# Patient Record
Sex: Female | Born: 1985
Health system: Southern US, Community
[De-identification: ages and names within clinical notes are randomized; demographics above are authoritative.]

## PROBLEM LIST (undated history)

## (undated) DIAGNOSIS — J45909 Unspecified asthma, uncomplicated: Secondary | ICD-10-CM

## (undated) DIAGNOSIS — I10 Essential (primary) hypertension: Secondary | ICD-10-CM

## (undated) DIAGNOSIS — M549 Dorsalgia, unspecified: Secondary | ICD-10-CM

## (undated) DIAGNOSIS — K219 Gastro-esophageal reflux disease without esophagitis: Secondary | ICD-10-CM

## (undated) DIAGNOSIS — F419 Anxiety disorder, unspecified: Secondary | ICD-10-CM

## (undated) DIAGNOSIS — R87629 Unspecified abnormal cytological findings in specimens from vagina: Secondary | ICD-10-CM

## (undated) DIAGNOSIS — D649 Anemia, unspecified: Secondary | ICD-10-CM

## (undated) DIAGNOSIS — E785 Hyperlipidemia, unspecified: Secondary | ICD-10-CM

## (undated) DIAGNOSIS — K259 Gastric ulcer, unspecified as acute or chronic, without hemorrhage or perforation: Secondary | ICD-10-CM

## (undated) DIAGNOSIS — G43909 Migraine, unspecified, not intractable, without status migrainosus: Secondary | ICD-10-CM

## (undated) DIAGNOSIS — K5900 Constipation, unspecified: Secondary | ICD-10-CM

## (undated) HISTORY — DX: Essential (primary) hypertension: I10

## (undated) HISTORY — DX: Gastro-esophageal reflux disease without esophagitis: K21.9

## (undated) HISTORY — DX: Anxiety disorder, unspecified: F41.9

## (undated) HISTORY — DX: Anemia, unspecified: D64.9

## (undated) HISTORY — DX: Constipation, unspecified: K59.00

## (undated) HISTORY — DX: Hyperlipidemia, unspecified: E78.5

## (undated) HISTORY — DX: Unspecified abnormal cytological findings in specimens from vagina: R87.629

## (undated) HISTORY — DX: Gastric ulcer, unspecified as acute or chronic, without hemorrhage or perforation: K25.9

## (undated) HISTORY — DX: Dorsalgia, unspecified: M54.9

---

## 2014-12-20 ENCOUNTER — Emergency Department (HOSPITAL_BASED_OUTPATIENT_CLINIC_OR_DEPARTMENT_OTHER)
Admission: EM | Admit: 2014-12-20 | Discharge: 2014-12-20 | Disposition: A | Discharge status: Home / Self Care | Payer: 59 | Attending: Emergency Medicine | Admitting: Emergency Medicine

## 2014-12-20 ENCOUNTER — Encounter (HOSPITAL_BASED_OUTPATIENT_CLINIC_OR_DEPARTMENT_OTHER): Payer: Self-pay | Admitting: *Deleted

## 2014-12-20 DIAGNOSIS — Z720 Tobacco use: Secondary | ICD-10-CM | POA: Insufficient documentation

## 2014-12-20 DIAGNOSIS — Z79899 Other long term (current) drug therapy: Secondary | ICD-10-CM | POA: Diagnosis not present

## 2014-12-20 DIAGNOSIS — Z8679 Personal history of other diseases of the circulatory system: Secondary | ICD-10-CM | POA: Diagnosis not present

## 2014-12-20 DIAGNOSIS — J45909 Unspecified asthma, uncomplicated: Secondary | ICD-10-CM | POA: Insufficient documentation

## 2014-12-20 DIAGNOSIS — N760 Acute vaginitis: Secondary | ICD-10-CM | POA: Insufficient documentation

## 2014-12-20 DIAGNOSIS — Z3202 Encounter for pregnancy test, result negative: Secondary | ICD-10-CM | POA: Insufficient documentation

## 2014-12-20 DIAGNOSIS — N898 Other specified noninflammatory disorders of vagina: Secondary | ICD-10-CM | POA: Diagnosis present

## 2014-12-20 DIAGNOSIS — B9689 Other specified bacterial agents as the cause of diseases classified elsewhere: Secondary | ICD-10-CM

## 2014-12-20 HISTORY — DX: Unspecified asthma, uncomplicated: J45.909

## 2014-12-20 HISTORY — DX: Migraine, unspecified, not intractable, without status migrainosus: G43.909

## 2014-12-20 LAB — WET PREP, GENITAL
Trich, Wet Prep: NONE SEEN
Yeast Wet Prep HPF POC: NONE SEEN

## 2014-12-20 LAB — URINE MICROSCOPIC-ADD ON

## 2014-12-20 LAB — URINALYSIS, ROUTINE W REFLEX MICROSCOPIC
GLUCOSE, UA: NEGATIVE mg/dL
HGB URINE DIPSTICK: NEGATIVE
Ketones, ur: 15 mg/dL — AB
NITRITE: NEGATIVE
PH: 5.5 (ref 5.0–8.0)
Protein, ur: NEGATIVE mg/dL
SPECIFIC GRAVITY, URINE: 1.037 — AB (ref 1.005–1.030)
Urobilinogen, UA: 1 mg/dL (ref 0.0–1.0)

## 2014-12-20 LAB — PREGNANCY, URINE: PREG TEST UR: NEGATIVE

## 2014-12-20 MED ORDER — METRONIDAZOLE 500 MG PO TABS: 500.0000 mg | ORAL_TABLET | Freq: Two times a day (BID) | ORAL | Status: DC

## 2014-12-20 MED ORDER — METRONIDAZOLE 500 MG PO TABS
500.0000 mg | ORAL_TABLET | Freq: Once | ORAL | Status: AC
  Administered 2014-12-20: 500 mg via ORAL
  Filled 2014-12-20: qty 1

## 2014-12-20 NOTE — Discharge Instructions (Signed)
°Take your antibiotics as directed and to completion. You should never have any leftover antibiotics! Push fluids and stay well hydrated.  ° °Any antibiotic use can reduce the efficacy of hormonal birth control. Please use back up method of contraception.  ° °Do not drink alcohol while you are taking flagyl (metronidazole) because it will make you very sick. ° °Do not hesitate to return to the emergency room for any new, worsening or concerning symptoms. ° °Please obtain primary care using resource guide below. Let them know that you were seen in the emergency room and that they will need to obtain records for further outpatient management. ° ° °Bacterial Vaginosis °Bacterial vaginosis is a vaginal infection that occurs when the normal balance of bacteria in the vagina is disrupted. It results from an overgrowth of certain bacteria. This is the most common vaginal infection in women of childbearing age. Treatment is important to prevent complications, especially in pregnant women, as it can cause a premature delivery. °CAUSES  °Bacterial vaginosis is caused by an increase in harmful bacteria that are normally present in smaller amounts in the vagina. Several different kinds of bacteria can cause bacterial vaginosis. However, the reason that the condition develops is not fully understood. °RISK FACTORS °Certain activities or behaviors can put you at an increased risk of developing bacterial vaginosis, including: °· Having a new sex partner or multiple sex partners. °· Douching. °· Using an intrauterine device (IUD) for contraception. °Women do not get bacterial vaginosis from toilet seats, bedding, swimming pools, or contact with objects around them. °SIGNS AND SYMPTOMS  °Some women with bacterial vaginosis have no signs or symptoms. Common symptoms include: °· Grey vaginal discharge. °· A fishlike odor with discharge, especially after sexual intercourse. °· Itching or burning of the vagina and vulva. °· Burning or  pain with urination. °DIAGNOSIS  °Your health care provider will take a medical history and examine the vagina for signs of bacterial vaginosis. A sample of vaginal fluid may be taken. Your health care provider will look at this sample under a microscope to check for bacteria and abnormal cells. A vaginal pH test may also be done.  °TREATMENT  °Bacterial vaginosis may be treated with antibiotic medicines. These may be given in the form of a pill or a vaginal cream. A second round of antibiotics may be prescribed if the condition comes back after treatment.  °HOME CARE INSTRUCTIONS  °· Only take over-the-counter or prescription medicines as directed by your health care provider. °· If antibiotic medicine was prescribed, take it as directed. Make sure you finish it even if you start to feel better. °· Do not have sex until treatment is completed. °· Tell all sexual partners that you have a vaginal infection. They should see their health care provider and be treated if they have problems, such as a mild rash or itching. °· Practice safe sex by using condoms and only having one sex partner. °SEEK MEDICAL CARE IF:  °· Your symptoms are not improving after 3 days of treatment. °· You have increased discharge or pain. °· You have a fever. °MAKE SURE YOU:  °· Understand these instructions. °· Will watch your condition. °· Will get help right away if you are not doing well or get worse. °FOR MORE INFORMATION  °Centers for Disease Control and Prevention, Division of STD Prevention: www.cdc.gov/std °American Sexual Health Association (ASHA): www.ashastd.org  °Document Released: 06/27/2005 Document Revised: 04/17/2013 Document Reviewed: 02/06/2013 °ExitCare® Patient Information ©2015 [employer]. This information is   not intended to replace advice given to you by your health care provider. Make sure you discuss any questions you have with your health care provider. ° ° °Emergency Department Resource Guide °1) Find a Doctor and  Pay Out of Pocket °Although you won't have to find out who is covered by your insurance plan, it is a good idea to ask around and get recommendations. You will then need to call the office and see if the doctor you have chosen will accept you as a new patient and what types of options they offer for patients who are self-pay. Some doctors offer discounts or will set up payment plans for their patients who do not have insurance, but you will need to ask so you aren't surprised when you get to your appointment. ° °2) Contact Your Local Health Department °Not all health departments have doctors that can see patients for sick visits, but many do, so it is worth a call to see if yours does. If you don't know where your local health department is, you can check in your phone book. The CDC also has a tool to help you locate your state's health department, and many state websites also have listings of all of their local health departments. ° °3) Find a Walk-in Clinic °If your illness is not likely to be very severe or complicated, you may want to try a walk in clinic. These are popping up all over the country in pharmacies, drugstores, and shopping centers. They're usually staffed by nurse practitioners or physician assistants that have been trained to treat common illnesses and complaints. They're usually fairly quick and inexpensive. However, if you have serious medical issues or chronic medical problems, these are probably not your best option. ° °No Primary Care Doctor: °- Call [facility] at  832-8000 - they can help you locate a primary care doctor that  accepts your insurance, provides certain services, etc. °- Physician Referral Service- 1-800-533-3463 ° °Chronic Pain Problems: °Organization         Address  Phone   Notes  °[facility]  (336) 297-2271 Patients need to be referred by their primary care doctor.  ° °Medication Assistance: °Organization         Address  Phone   Notes  °[place] [facility] [address] °[place], [place] [address] (336) 641-8030 --Must be a resident of [place] °-- Must have NO insurance coverage whatsoever (no Medicaid/ Medicare, etc.) °-- The pt. MUST have a primary care doctor that directs their care regularly and follows them in the community °  °[facility]  (866) 331-1348   °[employer]  (888) 892-1162   ° °Agencies that provide inexpensive medical care: °Organization         Address  Phone   Notes  °[facility]  (336) 832-8035   °[facility]    (336) 832-7272   °[facility] [address] °[place], [place] [address] (336) 832-4777   °[facility] [address], °[place] (336) 271-4999   °[facility]    (336) 373-0678   °[facility]    (336) 272-1050   °[facility] ° [address], [place] Phone:  (336) 832-4444, Fax:  (336) 832-4440 Hours of Operation:  9 am - 6 pm, M-F.  Also accepts Medicaid/Medicare and self-pay.  °[facility] ° [address], [place] Phone: (336) 832-3150, Fax: (336) 832-3151. Hours of Operation:  8:30 am - 5:30 pm, M-F.  Also accepts Medicaid and self-pay.  °[facility] [place] [address], [place] Phone: (336) 878-6027   °[facility] [address], [place], [place] (336)723-1848, Ext. 123 Mondays & Thursdays: 7-9 AM.  First 15 patients are seen on a first come, first serve basis. °  ° °Medicaid-accepting [place] Providers: ° °Organization         Address  Phone   Notes  °[facility] [address], [place] (336) 641-2100 Also accepts self-pay patients.  °[facility] [address], [place] ° (336) 856-9996   °[facility] [address], [place] (336) 288-8857   °[facility] [address] [place] [address], [place] (336) 299-7000   °Veita Bland [address], [place]  ° (336) 373-1557 Only accepts [place] Access Medicaid patients after they have their name applied to their card.  ° °Self-Pay (no insurance) in [place]: ° °Organization         Address  Phone   Notes  °Sickle Cell Patients, [facility] [address], [place] (336) 832-1970   °[facility] [address], [place] (336) 832-4400   °[facility] ° [address], [place] (336) 992-4800   °[facility]/Dr. Osei-Bonsu ° [address] [place] [address], [place] or [address], [place] (336) [phone] Phone number for both [place] and [place] locations is the same.  °[facility] [address], [place] (336) 299-0000   °[facility] [address] [place] [address], [place] or [address] (336) 852-7530 °(336) 878-2260   °[facility] [address], [place] (336) 350-1642, phone; (336) 294-5005, fax Sees patients 1st and 3rd Saturday of every month.  Must not qualify for public or private insurance (i.e. Medicaid, Medicare, [place] Health Choice, Veterans' Benefits) • Household income should be no more than 200% of the poverty level •The clinic cannot treat you if you are pregnant or think you are pregnant • Sexually transmitted diseases are not treated at the clinic.  ° ° °Dental Care: °Organization         Address  Phone  Notes  °[facility] [address], [place] (336) 641-6152 Accepts children up to age 21 who are enrolled in Medicaid or [place] Health Choice; pregnant women with a Medicaid card; and children who have applied for Medicaid or [place] Health Choice, but were declined, whose parents can pay a reduced fee at time of service.  °[facility] [place]  [address], [place] (336) 641-7733 Accepts children up to age 21 who are enrolled in Medicaid or [place] Health Choice; pregnant women with a Medicaid card; and children who have applied for Medicaid or [place] Health Choice, but were declined, whose parents can pay a reduced fee at time of service.  °[facility] ° [address], [place] (336) 641-4533 Patients are seen by appointment only. Walk-ins are not accepted. [facility] will see patients 18 years of age and older. °Monday - Tuesday (8am-5pm) °Most Wednesdays (8:30-5pm) °$30 per visit, cash only  °[facility] ° [address], [place] (336) 641-4533 Patients are seen by appointment only. Walk-ins are not accepted. [facility] will see patients 18 years of age and older. °One Wednesday Evening (Monthly: Volunteer Based).  $30 per visit, cash only  °[facility]  (919) 537-3737 for adults; Children under age 4, call Graduate Pediatric Dentistry at ([phone]) [phone]. Children aged 4-14, please call (919) [phone] to request a pediatric application. ° Dental services are provided in all areas of dental care including fillings, crowns and bridges, complete and partial dentures, implants, gum treatment, root canals, and extractions. Preventive care is also provided. Treatment is provided to both adults and children. °Patients are selected via a lottery and there is often a waiting list. °  °[facility] [address], °[place] ° (336) 763-8833 www.drcivils.com °  °[facility] [address], [place], [place] (336)723-1848, Ext. 123 Second and Fourth Thursday of each month, opens at 6:30 AM; Clinic ends at 9 AM.  Patients are seen on a first-come first-served basis, and a limited number are seen during each clinic.  ° °[facility] ° [address], [place], [place] (336) 723-7904   Eligibility Requirements °You must have lived in [place], [place], or  [place] counties for at least the last three months. °  You cannot be eligible for state or federal sponsored healthcare insurance, including Veterans Administration, Medicaid, or Medicare. °  You generally cannot be eligible for healthcare insurance through your employer.  °  How to apply: °Eligibility screenings are held every Tuesday and Wednesday afternoon from 1:00 pm until 4:00 pm. You do not need an appointment for the interview!  °[facility] [address], [place], [place] 336-631-2330   °[facility]  336-342-8273   °[facility]  336-703-3100   °[facility]  336-570-6415   ° °Behavioral Health Resources in the Community: °Intensive Outpatient Programs °Organization         Address  Phone  Notes  °[facility] [address], [place], [place] 336-878-6098   °[facility] [address], [place], [place] 336-832-9800   °[facility] [address], [place], [place] ° 336-882-2125   °[facility] [address],  °[place], [place] 1-800-853-5163 or 336-641-4981   °Substance Abuse Resources °Organization         Address  Phone  Notes  °[facility]  336-882-2125   °[facility]  336-784-9470   °[facility]  336-285-9073   °[facility]  336-845-3988   °Residential & Outpatient Substance Abuse Program  1-800-659-3381   °Psychological Services °Organization         Address  Phone  Notes  °[facility]  336- 832-9600   °[facility]  336- 378-7881   °[facility] [address], [place] 1-800-853-5163 or 336-641-4981   ° °Mobile Crisis Teams °Organization         Address  Phone  Notes  °[facility], Mobile Crisis Care Unit  1-877-626-1772   °Assertive °[facility] ° [address] [place], [place] 336-834-9664   °Sharon DeEsch [address] °[place]  336-554-5454   ° °Self-Help/Support Groups °Organization         Address  Phone             Notes  °[facility]  -   variety of support groups  336- 373-1402 Call for more information  °Narcotics Anonymous (NA), [facility] [address], °[place]  2 meetings at this location  ° °Residential Treatment Programs °Organization         Address  Phone  Notes  °[facility] [address],    °[place]  1-866-801-8205   °[facility] ° [address], [place], [place] 704-293-8524   °[facility] [address], [place] 336-845-3988 Admissions: 8am-3pm M-F  °[facility] [address]    °[place], [place] 336-841-1104   °[facility] [address], [place], [place] 336-379-7146   °[facility] [address]  °[place], [place] 336-285-9073   °[facility] - Intensive Outpatient [address], [place], [place] 336-852-3033   °[facility] [address] [place] [address]  °[place], [place] 1-877-615-2722 or 336-784-9470   °[facility] ([facility]) [address] [place], [place] 336-227-7417 Accepts Medicaid  °[facility] [address]  ° [place] 1-800-659-3381 Substance Abuse/Addiction Treatment  ° °[place] Behavioral Health Resources °Organization         Address  Phone  Notes  °[facility]  (888) 581-9988   °Julie Brannon, PhD [address] [place], [place]   (336) 349-5553 or (336) 951-0000   °[facility]   [address] °[place], [place] (336) 349-4454   °[facility] [address], [place], [place] (336) 342-8316 Insurance/Medicaid/sponsorship through [facility]  °[facility] [address]                                    [place], [place] (336) 342-8316 Therapy/tele-psych/case  °[facility] [address]  ° [place], [place] (336) 349-2233     °Dr. Arfeen  (336) 349-4544   °[facility] [place]  [employer] [place] [facility] 1) [address],  °2) [address], [place] °3)  [address], [place] (336) 349-3220 °(336) 342-7768 ° °(336) 342-8140   °[place] Child Abuse [facility] (336) 342-1394 or (336) 342-3537 (After Hours)    ° ° ° °

## 2014-12-20 NOTE — ED Notes (Signed)
Pt instructed to undress

## 2014-12-20 NOTE — ED Notes (Signed)
Pt c/o vaginal discharge x 4 days. 

## 2014-12-20 NOTE — ED Provider Notes (Signed)
CSN: [id number]     Arrival date & time 12/20/14  1219 History   First MD Initiated Contact with Patient 12/20/14 1230     Chief Complaint  Patient presents with  . Vaginal Discharge     (Consider location/radiation/quality/duration/timing/severity/associated sxs/prior Treatment) HPI  Sherry Villegas is a 29 y.o. female complaining of Foul-smelling vaginal discharge with slight pruritus onset 3 days ago. Patient states that this does not feel like prior yeast infections. She denies recent unprotected sex, concerns about STDs, abdominal pain, fever, chills, nausea, vomiting, dysuria hematuria, urinary frequency or change in bowel habits. Patient recently moved to the area and has not established primary care or OB/GYN care.   Past Medical History  Diagnosis Date  . Asthma   . Migraine    History reviewed. No pertinent past surgical history. History reviewed. No pertinent family history. History  Substance Use Topics  . Smoking status: Current Every Day Smoker -- 0.50 packs/day    Types: Cigarettes  . Smokeless tobacco: Not on file  . Alcohol Use: No   OB History    No data available     Review of Systems  10 systems reviewed and found to be negative, except as noted in the HPI.  Allergies  Amoxil  Home Medications   Prior to Admission medications   Medication Sig Start Date End Date Taking? Authorizing Provider  albuterol (PROVENTIL HFA;VENTOLIN HFA) 108 (90 BASE) MCG/ACT inhaler Inhale 2 puffs into the lungs every 6 (six) hours as needed for wheezing or shortness of breath.   Yes Historical Provider, MD  metroNIDAZOLE (FLAGYL) 500 MG tablet Take 1 tablet (500 mg total) by mouth 2 (two) times daily. One tab PO bid x 10 days 12/20/14   Joni Reining Averyanna Sax, PA-C   BP 130/87 mmHg  Pulse 82  Temp(Src) 98.5 F (36.9 C)  Resp 18  Ht  (1.626 m)  Wt 217 lb (98.431 kg)  BMI 37.23 kg/m2  SpO2 100%  LMP 12/18/2014 Physical Exam  Constitutional: She is oriented to person,  place, and time. She appears well-developed and well-nourished. No distress.  HENT:  Head: Normocephalic and atraumatic.  Mouth/Throat: Oropharynx is clear and moist.  Eyes: Conjunctivae and EOM are normal. Pupils are equal, round, and reactive to light.  Neck: Normal range of motion.  Cardiovascular: Normal rate, regular rhythm and intact distal pulses.   Pulmonary/Chest: Effort normal and breath sounds normal.  Abdominal: Soft. There is no tenderness.  Genitourinary:  Pelvic exam is chaperoned by nurse: No rashes or lesions, patient has clitoris piercing. Scant, foul-smelling gray homogenous discharge. No cervical motion or adnexal tenderness palpation.  Musculoskeletal: Normal range of motion.  Neurological: She is alert and oriented to person, place, and time.  Skin: She is not diaphoretic.  Psychiatric: She has a normal mood and affect.  Nursing note and vitals reviewed.   ED Course  Procedures (including critical care time) Labs Review Labs Reviewed  WET PREP, GENITAL - Abnormal; Notable for the following:    Clue Cells Wet Prep HPF POC TOO NUMEROUS TO COUNT (*)    WBC, Wet Prep HPF POC FEW (*)    All other components within normal limits  URINALYSIS, ROUTINE W REFLEX MICROSCOPIC (NOT AT [facility]) - Abnormal; Notable for the following:    Specific Gravity, Urine 1.037 (*)    Bilirubin Urine SMALL (*)    Ketones, ur 15 (*)    Leukocytes, UA TRACE (*)    All other components within normal limits  URINE MICROSCOPIC-ADD ON - Abnormal; Notable for the following:    Squamous Epithelial / LPF FEW (*)    Bacteria, UA MANY (*)    All other components within normal limits  PREGNANCY, URINE  GC/CHLAMYDIA PROBE AMP ([place]) NOT AT [facility]    Imaging Review No results found.   EKG Interpretation None      MDM   Final diagnoses:  Bacterial vaginosis    Filed Vitals:   12/20/14 1228 12/20/14 1247 12/20/14 1432  BP: 122/88 127/82 130/87  Pulse: 119 109 82  Temp: 98.5 F  (36.9 C)    Resp: 16 18 18   Height: 5\' 4"  (1.626 m)    Weight: 217 lb (98.431 kg)    SpO2: 97% 100% 100%    Medications  metroNIDAZOLE (FLAGYL) tablet 500 mg (500 mg Oral Given 12/20/14 1431)    Sherry Villegas is a pleasant 29 y.o. female presenting with Foul-smelling vaginal discharge. Abdominal exam is benign. Urinalysis shows many bacteria with trace leukocytes and negative nitrite. Patient is asymptomatic, no indication for anabiotic for asymptomatic bacteriuria. Pelvic exam with no signs of PID, clue cells seen on wet prep. Advised patient not to drink alcohol with Flagyl. [facility]  Evaluation does not show pathology that would require ongoing emergent intervention or inpatient treatment. Pt is hemodynamically stable and mentating appropriately. Discussed findings and plan with patient/guardian, who agrees with care plan. All questions answered. Return precautions discussed and outpatient follow up given.   New Prescriptions   METRONIDAZOLE (FLAGYL) 500 MG TABLET    Take 1 tablet (500 mg total) by mouth 2 (two) times daily. One tab PO bid x 10 days         Wynetta Emery, PA-C 12/20/14 1440  Doug Sou, MD 12/20/14 1555

## 2014-12-22 LAB — GC/CHLAMYDIA PROBE AMP (~~LOC~~) NOT AT ARMC
CHLAMYDIA, DNA PROBE: NEGATIVE
NEISSERIA GONORRHEA: NEGATIVE

## 2015-02-03 ENCOUNTER — Telehealth: Payer: Self-pay | Admitting: Behavioral Health

## 2015-02-03 NOTE — Telephone Encounter (Signed)
Unable to reach patient at time of Pre-Visit Call.  Left message for patient to return call when available.    

## 2015-02-04 ENCOUNTER — Ambulatory Visit: Payer: 59 | Admitting: Medical

## 2015-04-28 ENCOUNTER — Ambulatory Visit: Payer: 59 | Admitting: Physician Assistant

## 2015-06-22 ENCOUNTER — Encounter: Payer: Self-pay | Admitting: Internal Medicine

## 2015-06-22 ENCOUNTER — Ambulatory Visit (INDEPENDENT_AMBULATORY_CARE_PROVIDER_SITE_OTHER): Payer: 59 | Admitting: Internal Medicine

## 2015-06-22 VITALS — BP 110/80 | HR 89 | Ht 64.0 in | Wt 229.4 lb

## 2015-06-22 DIAGNOSIS — J4489 Other specified chronic obstructive pulmonary disease: Secondary | ICD-10-CM | POA: Insufficient documentation

## 2015-06-22 DIAGNOSIS — F17210 Nicotine dependence, cigarettes, uncomplicated: Secondary | ICD-10-CM

## 2015-06-22 DIAGNOSIS — J45909 Unspecified asthma, uncomplicated: Secondary | ICD-10-CM

## 2015-06-22 DIAGNOSIS — J449 Chronic obstructive pulmonary disease, unspecified: Secondary | ICD-10-CM | POA: Diagnosis not present

## 2015-06-22 DIAGNOSIS — Z720 Tobacco use: Secondary | ICD-10-CM | POA: Diagnosis not present

## 2015-06-22 MED ORDER — MOMETASONE FURO-FORMOTEROL FUM 100-5 MCG/ACT IN AERO: INHALATION_SPRAY | RESPIRATORY_TRACT | Status: DC

## 2015-06-22 NOTE — Patient Instructions (Signed)
GERD (REFLUX)  is an extremely common cause of respiratory symptoms just like yours , many times with no obvious heartburn at all.    It can be treated with medication, but also with lifestyle changes including elevation of the head of your bed (ideally with 6 inch  bed blocks),  Smoking cessation, avoidance of late meals, excessive alcohol, and avoid fatty foods, chocolate, peppermint, colas, red wine, and acidic juices such as orange juice.  NO MINT OR MENTHOL PRODUCTS SO NO COUGH DROPS  USE SUGARLESS CANDY INSTEAD (Jolley ranchers or Stover's or Life Savers) or even ice chips will also do - the key is to swallow to prevent all throat clearing. NO OIL BASED VITAMINS - use powdered substitutes.  The key is to stop smoking completely before smoking completely stops you!   For any flare of resp symptoms > dulera 100 Take 2 puffs first thing in am and then another 2 puffs about 12 hours later.   Work on inhaler technique:  relax and gently blow all the way out then take a nice smooth deep breath back in, triggering the inhaler at same time you start breathing in.  Hold for up to 5 seconds if you can. Blow out thru nose. Rinse and gargle with water when done      Please schedule a follow up office visit in 6 weeks, call sooner if needed with pfts on return

## 2015-06-22 NOTE — Assessment & Plan Note (Addendum)
DDX of  difficult airways management all start with A and  include Adherence, Ace Inhibitors, Acid Reflux, Active Sinus Disease, Alpha 1 Antitripsin deficiency, Anxiety masquerading as Airways dz,  ABPA,  allergy(esp in young), Aspiration (esp in elderly), Adverse effects of meds,  Active smokers, A bunch of PE's (a small clot burden can't cause this syndrome unless there is already severe underlying pulm or vascular dz with poor reserve) plus two Bs  = Bronchiectasis and Beta blocker use..and one C= CHF  Adherence is always the initial "prime suspect" and is a multilayered concern that requires a "trust but verify" approach in every patient - starting with knowing how to use medications, especially inhalers, correctly, keeping up with refills and understanding the fundamental difference between maintenance and prns vs those medications only taken for a very short course and then stopped and not refilled.  - The proper method of use, as well as anticipated side effects, of a metered-dose inhaler are discussed and demonstrated to the patient. Improved effectiveness after extensive coaching during this visit to a level of approximately 75 % from a baseline of 50 %   Active smoking > see sep a/p  ? Acid (or non-acid) GERD > always difficult to exclude as up to 75% of pts in some series report no assoc GI/ Heartburn symptoms with obesity the major risk factor > rec max   diet restrictions/ reviewed and instructions given in writing.   ? Allergies > no typical seasonal /environmental trigger identified, more with uri's typical of intrinsic asthma  Will try dulera 100 2bid whenever having symptoms and return in 6 weeks for pfts   Total time devoted to counseling  = 25/8924m review case with pt/ discussion of options/alternatives/ giving and going over instructions (see avs)  Rule of 2's reviewed

## 2015-06-22 NOTE — Progress Notes (Signed)
   Subjective:    Patient ID: Sherry GaleJessica Villegas, female    DOB: 11/03/1985,     MRN: [id number]  HPI  [id number] yobf [place] up in [place] started smoking 20 then onset of bad cough / wheezing age [age] dx as asthma and rx just albuterol and self referred 06/22/2015 to pulmonary clinic.    06/22/2015 1st  Pulmonary office visit/ [facility]   Chief Complaint  Patient presents with  . Pulmonary Consult    Self referral. Pt states that she was dxed with Asthma approx 6 yrs ago. She states her asthma is currently under control, but she needs to est care since new to the area.   goes thru long periods of time with no symptoms but then starts coughing loosing voice/ sob  Bad problem with colds No am cough / congestion  Triggered also by heat/ cold   Not using any saba at present  No obvious other patterns in day to day or daytime variability or assoc excess/ purulent sputum or mucus plugs   or cp or chest tightness, subjective wheeze or overt sinus or hb symptoms. No unusual exp hx or h/o childhood pna/ asthma or knowledge of premature birth.  Sleeping ok without nocturnal  or early am exacerbation  of respiratory  c/o's or need for noct saba. Also denies any obvious fluctuation of symptoms with weather or environmental changes or other aggravating or alleviating factors except as outlined above   Current Medications, Allergies, Complete Past Medical History, Past Surgical History, Family History, and Social History were reviewed in [facility] electronic medical record.        Review of Systems  Constitutional: Negative for fever, chills and unexpected weight change.  HENT: Negative for congestion, dental problem, ear pain, nosebleeds, postnasal drip, rhinorrhea, sinus pressure, sneezing, sore throat, trouble swallowing and voice change.   Eyes: Negative for visual disturbance.  Respiratory: Negative for cough, choking and shortness of breath.   Cardiovascular: Negative for chest pain and leg  swelling.  Gastrointestinal: Negative for vomiting, abdominal pain and diarrhea.  Genitourinary: Negative for difficulty urinating.  Musculoskeletal: Negative for arthralgias.  Skin: Negative for rash.  Neurological: Negative for tremors, syncope and headaches.  Hematological: Does not bruise/bleed easily.       Objective:   Physical Exam  Obese amb bf nad  Wt Readings from Last 3 Encounters:  06/22/15 229 lb 6.4 oz (104.055 kg)  12/20/14 217 lb (98.431 kg)    Vital signs reviewed   HEENT: nl dentition, turbinates, and oropharynx. Nl external ear canals without cough reflex   NECK :  without JVD/Nodes/TM/ nl carotid upstrokes bilaterally   LUNGS: no acc muscle use,  Nl contour chest which is clear to A and P bilaterally without cough on insp or exp maneuvers   CV:  RRR  no s3 or murmur or increase in P2, no edema   ABD:  soft and nontender with nl inspiratory excursion in the supine position. No bruits or organomegaly, bowel sounds nl  MS:  Nl gait/ ext warm without deformities, calf tenderness, cyanosis or clubbing No obvious joint restrictions   SKIN: warm and dry without lesions    NEURO:  alert, approp, nl sensorium with  no motor deficits           Assessment & Plan:

## 2015-06-23 DIAGNOSIS — F17210 Nicotine dependence, cigarettes, uncomplicated: Secondary | ICD-10-CM | POA: Insufficient documentation

## 2015-06-23 NOTE — Assessment & Plan Note (Signed)

## 2015-08-06 ENCOUNTER — Ambulatory Visit: Payer: 59 | Admitting: Internal Medicine

## 2015-08-24 ENCOUNTER — Telehealth: Payer: Self-pay | Admitting: Behavioral Health

## 2015-08-24 NOTE — Telephone Encounter (Signed)
Unable to reach patient at time of Pre-Visit Call.  Left message for patient to return call when available.    

## 2015-08-25 ENCOUNTER — Ambulatory Visit (INDEPENDENT_AMBULATORY_CARE_PROVIDER_SITE_OTHER): Payer: 59 | Admitting: Family Medicine

## 2015-08-25 ENCOUNTER — Encounter: Payer: Self-pay | Admitting: Family Medicine

## 2015-08-25 VITALS — BP 114/74 | HR 78 | Temp 98.1°F | Ht 65.0 in | Wt 237.2 lb

## 2015-08-25 DIAGNOSIS — R0683 Snoring: Secondary | ICD-10-CM | POA: Diagnosis not present

## 2015-08-25 DIAGNOSIS — G43119 Migraine with aura, intractable, without status migrainosus: Secondary | ICD-10-CM | POA: Diagnosis not present

## 2015-08-25 DIAGNOSIS — K219 Gastro-esophageal reflux disease without esophagitis: Secondary | ICD-10-CM

## 2015-08-25 DIAGNOSIS — K589 Irritable bowel syndrome without diarrhea: Secondary | ICD-10-CM

## 2015-08-25 DIAGNOSIS — R5382 Chronic fatigue, unspecified: Secondary | ICD-10-CM

## 2015-08-25 DIAGNOSIS — G4700 Insomnia, unspecified: Secondary | ICD-10-CM

## 2015-08-25 DIAGNOSIS — R8290 Unspecified abnormal findings in urine: Secondary | ICD-10-CM

## 2015-08-25 LAB — CBC WITH DIFFERENTIAL/PLATELET
BASOS PCT: 0.5 % (ref 0.0–3.0)
Basophils Absolute: 0 10*3/uL (ref 0.0–0.1)
EOS PCT: 3.2 % (ref 0.0–5.0)
Eosinophils Absolute: 0.2 10*3/uL (ref 0.0–0.7)
HEMATOCRIT: 35.8 % — AB (ref 36.0–46.0)
HEMOGLOBIN: 11.6 g/dL — AB (ref 12.0–15.0)
Lymphocytes Relative: 37 % (ref 12.0–46.0)
Lymphs Abs: 1.9 10*3/uL (ref 0.7–4.0)
MCHC: 32.3 g/dL (ref 30.0–36.0)
MCV: 77.9 fl — AB (ref 78.0–100.0)
MONOS PCT: 6.7 % (ref 3.0–12.0)
Monocytes Absolute: 0.3 10*3/uL (ref 0.1–1.0)
Neutro Abs: 2.7 10*3/uL (ref 1.4–7.7)
Neutrophils Relative %: 52.6 % (ref 43.0–77.0)
Platelets: 410 10*3/uL — ABNORMAL HIGH (ref 150.0–400.0)
RBC: 4.6 Mil/uL (ref 3.87–5.11)
RDW: 15.7 % — AB (ref 11.5–15.5)
WBC: 5.1 10*3/uL (ref 4.0–10.5)

## 2015-08-25 LAB — COMPREHENSIVE METABOLIC PANEL
ALBUMIN: 4 g/dL (ref 3.5–5.2)
ALK PHOS: 64 U/L (ref 39–117)
ALT: 12 U/L (ref 0–35)
AST: 16 U/L (ref 0–37)
BUN: 9 mg/dL (ref 6–23)
CO2: 29 mEq/L (ref 19–32)
Calcium: 9.4 mg/dL (ref 8.4–10.5)
Chloride: 105 mEq/L (ref 96–112)
Creatinine, Ser: 0.91 mg/dL (ref 0.40–1.20)
GFR: 94.47 mL/min (ref >60.00)
Glucose, Bld: 91 mg/dL (ref 70–99)
POTASSIUM: 3.8 meq/L (ref 3.5–5.1)
Sodium: 139 mEq/L (ref 135–145)
Total Bilirubin: 0.2 mg/dL (ref 0.2–1.2)
Total Protein: 7.5 g/dL (ref 6.0–8.3)

## 2015-08-25 LAB — LIPASE: Lipase: 14 U/L (ref 11.0–59.0)

## 2015-08-25 LAB — LIPID PANEL
Cholesterol: 200 mg/dL (ref 0–200)
HDL: 72 mg/dL (ref >39.00)
LDL CALC: 105 mg/dL — AB (ref 0–99)
NONHDL: 128.36
Total CHOL/HDL Ratio: 3
Triglycerides: 118 mg/dL (ref 0.0–149.0)
VLDL: 23.6 mg/dL (ref 0.0–40.0)

## 2015-08-25 LAB — TSH: TSH: 1.72 u[IU]/mL (ref 0.35–4.50)

## 2015-08-25 LAB — POCT URINALYSIS DIPSTICK
Bilirubin, UA: NEGATIVE
Glucose, UA: NEGATIVE
Ketones, UA: NEGATIVE
Leukocytes, UA: NEGATIVE
PH UA: 6
SPEC GRAV UA: 1.025
UROBILINOGEN UA: 0.2

## 2015-08-25 LAB — MICROALBUMIN / CREATININE URINE RATIO
Creatinine,U: 264.4 mg/dL
MICROALB UR: 7 mg/dL — AB (ref 0.0–1.9)
Microalb Creat Ratio: 2.6 mg/g (ref 0.0–30.0)

## 2015-08-25 LAB — AMYLASE: Amylase: 56 U/L (ref 27–131)

## 2015-08-25 LAB — VITAMIN B12: VITAMIN B 12: 288 pg/mL (ref 211–911)

## 2015-08-25 MED ORDER — FAMOTIDINE 20 MG PO TABS: 20.0000 mg | ORAL_TABLET | Freq: Every day | ORAL | Status: DC

## 2015-08-25 MED ORDER — KETOROLAC TROMETHAMINE 60 MG/2ML IM SOLN
60.0000 mg | Freq: Once | INTRAMUSCULAR | Status: AC
  Administered 2015-08-25: 60 mg via INTRAMUSCULAR

## 2015-08-25 MED ORDER — AMITRIPTYLINE HCL 25 MG PO TABS: 25.0000 mg | ORAL_TABLET | Freq: Every day | ORAL | Status: DC

## 2015-08-25 MED ORDER — OMEPRAZOLE 40 MG PO CPDR: 40.0000 mg | DELAYED_RELEASE_CAPSULE | Freq: Every day | ORAL | Status: DC

## 2015-08-25 NOTE — Patient Instructions (Signed)

## 2015-08-25 NOTE — Progress Notes (Signed)
Pre visit review using our clinic review tool, if applicable. No additional management support is needed unless otherwise documented below in the visit note. 

## 2015-08-25 NOTE — Progress Notes (Signed)
Patient ID: Sherry Villegas, female    DOB: Feb 16, 1986  Age: 30 y.o. MRN: [id number]    Subjective:  Subjective HPI Sherry Villegas presents establish.  She c/o gerd,--- she is taking pepcid with no relief.  Symptoms worsening over the last few weeks.  Pt is also c/o extreme fatigue and insomnia.  She admits to snoring terribly and has never had a sleep work up.   She also c/o having trouble losing weight.   Review of Systems  Constitutional: Positive for fatigue and unexpected weight change. Negative for diaphoresis and appetite change.  Eyes: Negative for pain, redness and visual disturbance.  Respiratory: Negative for cough, chest tightness, shortness of breath and wheezing.   Cardiovascular: Negative for chest pain, palpitations and leg swelling.  Gastrointestinal: Positive for abdominal pain. Negative for nausea, vomiting, diarrhea, constipation and blood in stool.  Endocrine: Negative for cold intolerance, heat intolerance, polydipsia, polyphagia and polyuria.  Genitourinary: Negative for dysuria, frequency and difficulty urinating.  Neurological: Negative for dizziness, light-headedness, numbness and headaches.  Psychiatric/Behavioral: Positive for sleep disturbance and dysphoric mood. Negative for suicidal ideas and self-injury. The patient is not nervous/anxious.     History Past Medical History  Diagnosis Date  . Asthma   . Migraine     She has no past surgical history on file.   Her family history includes Breast cancer in her maternal aunt, maternal grandmother, and paternal grandmother; Heart disease (age of onset: 81) in her mother; Hypertension in her father; Stroke in her father and mother.She reports that she has been smoking Cigarettes.  She has a 2.25 pack-year smoking history. She has never used smokeless tobacco. She reports that she drinks alcohol. She reports that she does not use illicit drugs.  Current Outpatient Prescriptions on File Prior to Visit  Medication  Sig Dispense Refill  . albuterol (PROVENTIL HFA;VENTOLIN HFA) 108 (90 BASE) MCG/ACT inhaler Inhale 2 puffs into the lungs every 6 (six) hours as needed for wheezing or shortness of breath.    . mometasone-formoterol (DULERA) 100-5 MCG/ACT AERO Take 2 puffs first thing in am and then another 2 puffs about 12 hours later. 1 Inhaler 11   No current facility-administered medications on file prior to visit.     Objective:  Objective Physical Exam  Constitutional: She is oriented to person, place, and time. She appears well-developed and well-nourished.  HENT:  Head: Normocephalic and atraumatic.  Eyes: Conjunctivae and EOM are normal.  Neck: Normal range of motion. Neck supple. No JVD present. Carotid bruit is not present. No thyromegaly present.  Cardiovascular: Normal rate, regular rhythm and normal heart sounds.   No murmur heard. Pulmonary/Chest: Effort normal and breath sounds normal. No respiratory distress. She has no wheezes. She has no rales. She exhibits no tenderness.  Abdominal: There is tenderness in the epigastric area. There is no rigidity, no rebound and no guarding.  Musculoskeletal: She exhibits no edema.  Neurological: She is alert and oriented to person, place, and time.  Psychiatric: She has a normal mood and affect.  Nursing note and vitals reviewed.  BP 114/74 mmHg  Pulse 78  Temp(Src) 98.1 F (36.7 C) (Oral)  Ht '5\' 5"'$  (1.651 m)  Wt 237 lb 3.2 oz (107.593 kg)  BMI 39.47 kg/m2  SpO2 98%  LMP 07/26/2015 Wt Readings from Last 3 Encounters:  08/25/15 237 lb 3.2 oz (107.593 kg)  06/22/15 229 lb 6.4 oz (104.055 kg)  12/20/14 217 lb (98.431 kg)     Lab Results  Component  Value Date   WBC 5.1 08/25/2015   HGB 11.6* 08/25/2015   HCT 35.8* 08/25/2015   PLT 410.0* 08/25/2015   GLUCOSE 91 08/25/2015   CHOL 200 08/25/2015   TRIG 118.0 08/25/2015   HDL 72.00 08/25/2015   LDLCALC 105* 08/25/2015   ALT 12 08/25/2015   AST 16 08/25/2015   NA 139 08/25/2015   K  3.8 08/25/2015   CL 105 08/25/2015   CREATININE 0.91 08/25/2015   BUN 9 08/25/2015   CO2 29 08/25/2015   TSH 1.72 08/25/2015   MICROALBUR 7.0* 08/25/2015    No results found.   Assessment & Plan:  Plan I am having Ms. Alberg start on amitriptyline, famotidine, and omeprazole. I am also having her maintain her albuterol and mometasone-formoterol. We administered ketorolac.  Meds ordered this encounter  Medications  . amitriptyline (ELAVIL) 25 MG tablet    Sig: Take 1 tablet (25 mg total) by mouth at bedtime.    Dispense:  30 tablet    Refill:  2  . famotidine (PEPCID) 20 MG tablet    Sig: Take 1 tablet (20 mg total) by mouth daily.    Dispense:  30 tablet  . omeprazole (PRILOSEC) 40 MG capsule    Sig: Take 1 capsule (40 mg total) by mouth daily.    Dispense:  30 capsule    Refill:  11  . ketorolac (TORADOL) injection 60 mg    Sig:     Problem List Items Addressed This Visit    None    Visit Diagnoses    Intractable migraine with aura without status migrainosus    -  Primary    Relevant Medications    amitriptyline (ELAVIL) 25 MG tablet    ketorolac (TORADOL) injection 60 mg (Completed)    Other Relevant Orders    Ambulatory referral to Neurology    Comp Met (CMET) (Completed)    CBC with Differential/Platelet (Completed)    H. pylori antibody, IgG (Completed)    Lipid panel (Completed)    POCT urinalysis dipstick (Completed)    Microalbumin / creatinine urine ratio (Completed)    TSH (Completed)    Amylase (Completed)    Lipase (Completed)    Gastroesophageal reflux disease, esophagitis presence not specified        Relevant Medications    famotidine (PEPCID) 20 MG tablet    omeprazole (PRILOSEC) 40 MG capsule    Other Relevant Orders    Comp Met (CMET) (Completed)    CBC with Differential/Platelet (Completed)    H. pylori antibody, IgG (Completed)    Lipid panel (Completed)    POCT urinalysis dipstick (Completed)    Microalbumin / creatinine urine ratio  (Completed)    TSH (Completed)    Amylase (Completed)    Lipase (Completed)    Ambulatory referral to Gastroenterology    Chronic fatigue        Relevant Orders    Comp Met (CMET) (Completed)    CBC with Differential/Platelet (Completed)    H. pylori antibody, IgG (Completed)    Lipid panel (Completed)    POCT urinalysis dipstick (Completed)    Microalbumin / creatinine urine ratio (Completed)    TSH (Completed)    Amylase (Completed)    Lipase (Completed)    Vitamin B12 (Completed)    Vitamin D 1,25 dihydroxy    Snoring        Relevant Orders    Ambulatory referral to Pulmonology    Insomnia  Relevant Orders    Ambulatory referral to Pulmonology    Morbid obesity due to excess calories ([place])        Relevant Orders    Amb ref to Medical Nutrition Therapy-MNT    IBS (irritable bowel syndrome)        Relevant Medications    famotidine (PEPCID) 20 MG tablet    omeprazole (PRILOSEC) 40 MG capsule    Other Relevant Orders    Ambulatory referral to Gastroenterology    Abnormal urine        Relevant Orders    Urine Culture (Completed)       Follow-up: Return if symptoms worsen or fail to improve, for annual exam, fasting.  Garnet Koyanagi, DO

## 2015-08-26 ENCOUNTER — Telehealth: Payer: Self-pay | Admitting: *Deleted

## 2015-08-26 DIAGNOSIS — R809 Proteinuria, unspecified: Secondary | ICD-10-CM

## 2015-08-26 LAB — URINE CULTURE: Colony Count: 40000

## 2015-08-26 LAB — H. PYLORI ANTIBODY, IGG: H Pylori IgG: POSITIVE — AB

## 2015-08-26 MED ORDER — OMEPRAZOLE 20 MG PO CPDR: 20.0000 mg | DELAYED_RELEASE_CAPSULE | Freq: Every day | ORAL | Status: DC

## 2015-08-26 MED ORDER — METRONIDAZOLE 500 MG PO TABS: 500.0000 mg | ORAL_TABLET | Freq: Two times a day (BID) | ORAL | Status: DC

## 2015-08-26 MED ORDER — CLARITHROMYCIN 500 MG PO TABS: 500.0000 mg | ORAL_TABLET | Freq: Two times a day (BID) | ORAL | Status: DC

## 2015-08-26 NOTE — Telephone Encounter (Signed)
-----   Message from Lelon Perla, DO sent at 08/26/2015  9:45 AM EST ----- + h pylori---  metroniadazole 500 mg bid and biaxin 500 mg bid and omeprazole 20 mg bid for 10 days GI pending microalbumin is elevated---  Need 24 h urine for protein + anemic---  Take multivitamin daily

## 2015-08-26 NOTE — Telephone Encounter (Signed)
Notified pt and she voices understanding. Rxs sent, future lab order entered.

## 2015-08-26 NOTE — Telephone Encounter (Signed)
Left message for pt to return my call.

## 2015-08-27 LAB — VITAMIN D 1,25 DIHYDROXY
Vitamin D 1, 25 (OH)2 Total: 41 pg/mL (ref 18–72)
Vitamin D2 1, 25 (OH)2: <8 pg/mL
Vitamin D3 1, 25 (OH)2: 41 pg/mL

## 2015-08-31 ENCOUNTER — Encounter: Payer: Self-pay | Admitting: Family Medicine

## 2015-08-31 ENCOUNTER — Other Ambulatory Visit (INDEPENDENT_AMBULATORY_CARE_PROVIDER_SITE_OTHER): Payer: 59

## 2015-08-31 DIAGNOSIS — K219 Gastro-esophageal reflux disease without esophagitis: Secondary | ICD-10-CM

## 2015-08-31 DIAGNOSIS — R809 Proteinuria, unspecified: Secondary | ICD-10-CM | POA: Diagnosis not present

## 2015-08-31 LAB — PROTEIN, URINE, 24 HOUR
PROTEIN 24H UR: 84 mg/(24.h) (ref <150)
Protein, Urine: 7 mg/dL (ref 5–24)

## 2015-09-01 ENCOUNTER — Telehealth: Payer: Self-pay | Admitting: Family Medicine

## 2015-09-01 MED ORDER — FAMOTIDINE 20 MG PO TABS: 20.0000 mg | ORAL_TABLET | Freq: Every day | ORAL | Status: DC

## 2015-09-01 NOTE — Telephone Encounter (Signed)
Relation to ZO:XWRU Call back [phone]   Reason for call:  Patient checking on the status of [employer] FMLA paperwork advised patient 5 to 7 business day turn around patient states she dropped off paperwork 08/26/15 . Patient also wanted to check if medical records were received from prior PCP. Patient states she was advised by Dr. Laury Axon medical records were needed to complete form

## 2015-09-01 NOTE — Telephone Encounter (Signed)
It is otc so maybe her ins would not pay but I sent it We can send again

## 2015-09-02 NOTE — Telephone Encounter (Signed)
FMLA paperwork has been forwarded to provider for completion, as patient is New to [place] and there is no prior information to pull from; informed patient of status Also received patients prior Medical Records today, placed with FMLA paperwork for provider/SLS 02/22

## 2015-09-07 ENCOUNTER — Telehealth: Payer: Self-pay | Admitting: Family Medicine

## 2015-09-07 NOTE — Telephone Encounter (Signed)
LVM for pt to return call to schedule her appt for tomorrow.(at 10:30 Dr. Laury Axon) appt on hold for pt. If not pt can come in tomorrow afternoon (ok per [place])

## 2015-09-08 ENCOUNTER — Encounter: Payer: 59 | Admitting: Family Medicine

## 2015-09-10 ENCOUNTER — Telehealth: Payer: Self-pay | Admitting: Gastroenterology

## 2015-09-10 ENCOUNTER — Encounter: Payer: Self-pay | Admitting: Family Medicine

## 2015-09-10 ENCOUNTER — Ambulatory Visit: Payer: 59 | Admitting: Gastroenterology

## 2015-09-10 ENCOUNTER — Ambulatory Visit (INDEPENDENT_AMBULATORY_CARE_PROVIDER_SITE_OTHER): Payer: 59 | Admitting: Family Medicine

## 2015-09-10 VITALS — BP 120/80 | HR 106 | Temp 99.2°F | Wt 236.4 lb

## 2015-09-10 DIAGNOSIS — G43909 Migraine, unspecified, not intractable, without status migrainosus: Secondary | ICD-10-CM | POA: Insufficient documentation

## 2015-09-10 DIAGNOSIS — G43109 Migraine with aura, not intractable, without status migrainosus: Secondary | ICD-10-CM | POA: Diagnosis not present

## 2015-09-10 DIAGNOSIS — G43119 Migraine with aura, intractable, without status migrainosus: Secondary | ICD-10-CM

## 2015-09-10 MED ORDER — RIZATRIPTAN BENZOATE 10 MG PO TBDP: 10.0000 mg | ORAL_TABLET | ORAL | Status: DC | PRN

## 2015-09-10 NOTE — Telephone Encounter (Signed)
Yes charge 

## 2015-09-10 NOTE — Patient Instructions (Addendum)

## 2015-09-10 NOTE — Telephone Encounter (Signed)
Do you want to charge? 

## 2015-09-10 NOTE — Progress Notes (Signed)
Patient ID: Sherry Villegas, female    DOB: 1985-11-13  Age: 30 y.o. MRN: [id number]    Subjective:  Subjective HPI Sherry Villegas presents for fmla paperwork to be filled out for her migraines.  She gets about 4 a month but only 1 that she needs to call into work and take a few days off.  Neuro appointment is 3/10.  She had a gi appointment today too but will need to reschedule because she had to be here today.     Review of Systems  Constitutional: Negative for diaphoresis, appetite change, fatigue and unexpected weight change.  Eyes: Negative for pain, redness and visual disturbance.  Respiratory: Negative for cough, chest tightness, shortness of breath and wheezing.   Cardiovascular: Negative for chest pain, palpitations and leg swelling.  Gastrointestinal: Positive for abdominal pain.  Endocrine: Negative for cold intolerance, heat intolerance, polydipsia, polyphagia and polyuria.  Genitourinary: Negative for dysuria, frequency and difficulty urinating.  Neurological: Positive for headaches. Negative for dizziness, light-headedness and numbness.    History Past Medical History  Diagnosis Date  . Asthma   . Migraine     She has no past surgical history on file.   Her family history includes Breast cancer in her maternal aunt, maternal grandmother, and paternal grandmother; Heart disease (age of onset: 73) in her mother; Hypertension in her father; Stroke in her father and mother.She reports that she has been smoking Cigarettes.  She has a 2.25 pack-year smoking history. She has never used smokeless tobacco. She reports that she drinks alcohol. She reports that she does not use illicit drugs.  Current Outpatient Prescriptions on File Prior to Visit  Medication Sig Dispense Refill  . albuterol (PROVENTIL HFA;VENTOLIN HFA) 108 (90 BASE) MCG/ACT inhaler Inhale 2 puffs into the lungs every 6 (six) hours as needed for wheezing or shortness of breath.    Marland Kitchen amitriptyline (ELAVIL) 25 MG  tablet Take 1 tablet (25 mg total) by mouth at bedtime. 30 tablet 2  . famotidine (PEPCID) 20 MG tablet Take 1 tablet (20 mg total) by mouth daily. 30 tablet 0  . mometasone-formoterol (DULERA) 100-5 MCG/ACT AERO Take 2 puffs first thing in am and then another 2 puffs about 12 hours later. 1 Inhaler 11  . omeprazole (PRILOSEC) 40 MG capsule Take 1 capsule (40 mg total) by mouth daily. 30 capsule 11   No current facility-administered medications on file prior to visit.     Objective:  Objective Physical Exam  Constitutional: She is oriented to person, place, and time. She appears well-developed and well-nourished.  HENT:  Head: Normocephalic and atraumatic.  Eyes: Conjunctivae and EOM are normal.  Neck: Normal range of motion. Neck supple. No JVD present. Carotid bruit is not present. No thyromegaly present.  Cardiovascular: Normal rate, regular rhythm and normal heart sounds.   No murmur heard. Pulmonary/Chest: Effort normal and breath sounds normal. No respiratory distress. She has no wheezes. She has no rales. She exhibits no tenderness.  Musculoskeletal: She exhibits no edema.  Neurological: She is alert and oriented to person, place, and time.  Psychiatric: She has a normal mood and affect. Her behavior is normal. Judgment and thought content normal.  Nursing note and vitals reviewed.  BP 120/80 mmHg  Pulse 106  Temp(Src) 99.2 F (37.3 C) (Oral)  Wt 236 lb 6.4 oz (107.23 kg)  SpO2 98%  LMP 07/26/2015 Wt Readings from Last 3 Encounters:  09/10/15 236 lb 6.4 oz (107.23 kg)  08/25/15 237 lb 3.2 oz (107.593  kg)  06/22/15 229 lb 6.4 oz (104.055 kg)     Lab Results  Component Value Date   WBC 5.1 08/25/2015   HGB 11.6* 08/25/2015   HCT 35.8* 08/25/2015   PLT 410.0* 08/25/2015   GLUCOSE 91 08/25/2015   CHOL 200 08/25/2015   TRIG 118.0 08/25/2015   HDL 72.00 08/25/2015   LDLCALC 105* 08/25/2015   ALT 12 08/25/2015   AST 16 08/25/2015   NA 139 08/25/2015   K 3.8  08/25/2015   CL 105 08/25/2015   CREATININE 0.91 08/25/2015   BUN 9 08/25/2015   CO2 29 08/25/2015   TSH 1.72 08/25/2015   MICROALBUR 7.0* 08/25/2015    No results found.   Assessment & Plan:  Plan I have discontinued Ms. Crumpler's metroNIDAZOLE and clarithromycin. I am also having her start on rizatriptan. Additionally, I am having her maintain her albuterol, mometasone-formoterol, amitriptyline, omeprazole, and famotidine.  Meds ordered this encounter  Medications  . rizatriptan (MAXALT-MLT) 10 MG disintegrating tablet    Sig: Take 1 tablet (10 mg total) by mouth as needed for migraine. May repeat in 2 hours if needed    Dispense:  10 tablet    Refill:  0    Problem List Items Addressed This Visit    Migraines - Primary   Relevant Medications   rizatriptan (MAXALT-MLT) 10 MG disintegrating tablet    fmla paperwork filled out  Follow-up: Return in about 3 months (around 12/11/2015), or if symptoms worsen or fail to improve.  Loreen Freud, DO

## 2015-09-11 ENCOUNTER — Encounter: Payer: Self-pay | Admitting: Family Medicine

## 2015-09-18 ENCOUNTER — Ambulatory Visit: Payer: 59 | Admitting: Neurology

## 2015-10-13 ENCOUNTER — Ambulatory Visit: Payer: 59 | Admitting: Neurology

## 2015-10-19 ENCOUNTER — Institutional Professional Consult (permissible substitution): Payer: 59 | Admitting: Pulmonary Disease

## 2015-12-21 ENCOUNTER — Telehealth: Payer: Self-pay | Admitting: Internal Medicine

## 2015-12-21 MED ORDER — ALBUTEROL SULFATE HFA 108 (90 BASE) MCG/ACT IN AERS: 2.0000 | INHALATION_SPRAY | Freq: Four times a day (QID) | RESPIRATORY_TRACT | Status: DC | PRN

## 2015-12-21 NOTE — Telephone Encounter (Signed)
Spoke with pt  She was seen last in Dec 2016  She was put on Dulera at that time and advised to use PRN  She had not had to use it until last night- started coughing and feeling SOB  She states that she took 2 puffs and then started sneezing, runny nose hives on arms and face  She took benadryl and feels fine today besides just having a sore throat  She denies any SOB or rash today and no trouble swallowing  I advised needs ov and offered appt but she declined due to no ins or transportation  She is asking for refill on albuterol so she can use this as needed  Please advise thanks!

## 2015-12-21 NOTE — Telephone Encounter (Signed)
Okay to send refill for albuterol.  Send 1 inhaler with 5 refills.  Please list dulera in allergies.

## 2015-12-21 NOTE — Telephone Encounter (Signed)
MAR and allergy list updated  Spoke with the pt and notified of recs per VS  Rx for albuterol sent

## 2015-12-28 ENCOUNTER — Encounter: Payer: 59 | Admitting: Family Medicine

## 2016-02-02 ENCOUNTER — Other Ambulatory Visit: Payer: Self-pay | Admitting: Family Medicine

## 2016-03-10 ENCOUNTER — Telehealth: Payer: Self-pay | Admitting: Family Medicine

## 2016-03-10 NOTE — Telephone Encounter (Signed)
Completed.

## 2016-03-16 ENCOUNTER — Ambulatory Visit (INDEPENDENT_AMBULATORY_CARE_PROVIDER_SITE_OTHER): Payer: BLUE CROSS/BLUE SHIELD | Admitting: Physician Assistant

## 2016-03-16 ENCOUNTER — Encounter: Payer: Self-pay | Admitting: Physician Assistant

## 2016-03-16 ENCOUNTER — Other Ambulatory Visit (HOSPITAL_COMMUNITY)
Admission: RE | Admit: 2016-03-16 | Discharge: 2016-03-16 | Disposition: A | Discharge status: Home / Self Care | Payer: 59 | Source: Ambulatory Visit | Attending: Physician Assistant | Admitting: Physician Assistant

## 2016-03-16 VITALS — BP 128/84 | HR 77 | Temp 98.3°F | Resp 16 | Ht 65.0 in | Wt 234.2 lb

## 2016-03-16 DIAGNOSIS — Z113 Encounter for screening for infections with a predominantly sexual mode of transmission: Secondary | ICD-10-CM | POA: Insufficient documentation

## 2016-03-16 DIAGNOSIS — N898 Other specified noninflammatory disorders of vagina: Secondary | ICD-10-CM

## 2016-03-16 DIAGNOSIS — K648 Other hemorrhoids: Secondary | ICD-10-CM

## 2016-03-16 DIAGNOSIS — N760 Acute vaginitis: Secondary | ICD-10-CM | POA: Insufficient documentation

## 2016-03-16 LAB — POCT URINALYSIS DIPSTICK
Bilirubin, UA: NEGATIVE
Glucose, UA: NEGATIVE
KETONES UA: NEGATIVE
LEUKOCYTES UA: NEGATIVE
NITRITE UA: NEGATIVE
PH UA: 6
PROTEIN UA: NEGATIVE
RBC UA: NEGATIVE
Spec Grav, UA: >=1.03
UROBILINOGEN UA: 0.2

## 2016-03-16 LAB — POCT URINE PREGNANCY: Preg Test, Ur: NEGATIVE

## 2016-03-16 MED ORDER — CLINDAMYCIN PHOSPHATE 2 % VA CREA: 1.0000 | TOPICAL_CREAM | Freq: Every day | VAGINAL | 0 refills | Status: DC

## 2016-03-16 MED ORDER — FLUCONAZOLE 150 MG PO TABS: 150.0000 mg | ORAL_TABLET | Freq: Once | ORAL | 0 refills | Status: AC

## 2016-03-16 NOTE — Patient Instructions (Signed)
Please take Diflucan as directed. Apply the Cleocin gel as directed.  Keep well hydrated. Start the regimen below for constipation. For hemorrhoid -- get preparation H suppositories and apply as directed for max of 5-6 days.  If symptoms are not resolving we will need to set you up with specialist.  I will call with results.  I encourage you to increase hydration and the amount of fiber in your diet.  Start a daily probiotic (Align, Culturelle, Digestive Advantage, etc.). If no bowel movement within 24 hours, take 2 Tbs of Milk of Magnesia in a 4 oz glass of warmed prune juice every 2-3 days to help promote bowel movement. If no results within 24 hours, then repeat above regimen, adding a Dulcolax stool softener to regimen. If this does not promote a bowel movement, please call the office.

## 2016-03-16 NOTE — Progress Notes (Signed)
Patient presents to clinic today with multiple complaints.  Patient endorses intermittent constipation with episodes of BRBPR noted with wiping. Denies melena or true hematochezia. Endorses history of hemorrhoids. Denies sensation of anal mass. Does note a poor diet and poor hydration overall. Patient also endorses history of abdominal cramping relieved with defecation.  Patient also endorses vaginal discharge that is gray-white in color and altering in thickness. Endorses significant history of BV, most recently treated 3 weeks ago with Flagyl. Denies history of yeast infection. Is sexually active with 1 female partner. Denies consistent use of condoms. Denies concern for STI. Does note she is having some very mild breast tenderness. Patient's last menstrual period was 02/22/2016.  Past Medical History:  Diagnosis Date  . Asthma   . Migraine     Current Outpatient Prescriptions on File Prior to Visit  Medication Sig Dispense Refill  . famotidine (PEPCID) 20 MG tablet Take 1 tablet (20 mg total) by mouth daily. 30 tablet 0  . albuterol (PROVENTIL HFA;VENTOLIN HFA) 108 (90 Base) MCG/ACT inhaler Inhale 2 puffs into the lungs every 6 (six) hours as needed for wheezing or shortness of breath. (Patient not taking: Reported on 03/16/2016) 1 Inhaler 5  . amitriptyline (ELAVIL) 25 MG tablet Take 1 tablet (25 mg total) by mouth at bedtime. (Patient not taking: Reported on 03/16/2016) 30 tablet 2  . omeprazole (PRILOSEC) 40 MG capsule Take 1 capsule (40 mg total) by mouth daily. (Patient not taking: Reported on 03/16/2016) 30 capsule 11  . rizatriptan (MAXALT-MLT) 10 MG disintegrating tablet Take 1 tablet (10 mg total) by mouth as needed for migraine. May repeat in 2 hours if needed (Patient not taking: Reported on 03/16/2016) 10 tablet 0   No current facility-administered medications on file prior to visit.     Allergies  Allergen Reactions  . Amoxil [Amoxicillin] Anaphylaxis  . Dulera [Mometasone  Furo-Formoterol Fum] Hives    Family History  Problem Relation Age of Onset  . Heart disease Mother 7144    MI  . Stroke Mother   . Breast cancer Maternal Grandmother   . Breast cancer Maternal Aunt   . Breast cancer Paternal Grandmother   . Stroke Father   . Hypertension Father     Social History   Social History  . Marital status: Single    Spouse name: N/A  . Number of children: N/A  . Years of education: N/A   Occupational History  . customer service rep     Social History Main Topics  . Smoking status: Former Smoker    Packs/day: 0.25    Years: 9.00    Types: Cigarettes    Quit date: 02/14/2016  . Smokeless tobacco: Never Used  . Alcohol use 0.0 oz/week  . Drug use: No  . Sexual activity: No   Other Topics Concern  . None   Social History Narrative  . None   Review of Systems - See HPI.  All other ROS are negative.  Ht 5\' 5"  (1.651 m)   Wt 234 lb 4 oz (106.3 kg)   LMP 02/22/2016   BMI 38.98 kg/m   Physical Exam  Constitutional: She is oriented to person, place, and time and well-developed, well-nourished, and in no distress.  HENT:  Head: Normocephalic and atraumatic.  Eyes: Conjunctivae are normal.  Cardiovascular: Normal rate, regular rhythm, normal heart sounds and intact distal pulses.   Pulmonary/Chest: Effort normal and breath sounds normal. No respiratory distress. She has no wheezes. She has no  rales. She exhibits no tenderness.  Abdominal: Soft. Bowel sounds are normal. There is no tenderness.  Genitourinary: Uterus normal, cervix normal, right adnexa normal, left adnexa normal and vulva normal. Rectal exam shows internal hemorrhoid. Rectal exam shows no mass and guaiac negative stool. Cervix exhibits no motion tenderness. Thin  thick  fishy  white and vaginal discharge found.  Neurological: She is alert and oriented to person, place, and time.  Skin: Skin is warm. No rash noted.  Psychiatric: Affect normal.  Vitals  reviewed.  Assessment/Plan: 1. Vaginal discharge Giving history and appearance, will start Tx for BV while results pending. Rx Cleocin. Supportive measures discussed with patient. Will also give Diflucan due to thicker areas of discharge. Urine pregnancy negative. Urine dipstick without evidence for cystitis. Will alter regimen based on results.  - Cervicovaginal ancillary only - clindamycin (CLEOCIN) 2 % vaginal cream; Place 1 Applicatorful vaginally at bedtime. Insert 1 Applicatorful of medication into the vagina once nightly x 7 nights  Dispense: 35 g; Refill: 0 - fluconazole (DIFLUCAN) 150 MG tablet; Take 1 tablet (150 mg total) by mouth once.  Dispense: 1 tablet; Refill: 0 - POCT urine pregnancy - POCT urinalysis dipstick  2. Internal hemorrhoid Supportive measures, bowel regimen and OTC suppositories discussed. She is to FU with PCP if symptoms are not resolving to discuss further assessment of other chronic GI complaints.    Piedad Climes, PA-C

## 2016-03-17 ENCOUNTER — Other Ambulatory Visit: Payer: Self-pay

## 2016-03-17 DIAGNOSIS — N898 Other specified noninflammatory disorders of vagina: Secondary | ICD-10-CM

## 2016-03-17 NOTE — Progress Notes (Signed)
Received call from lab requesting we change resulting agent to [facility] instead of [facility].  New order placed.  Lab stated it was what they needed.

## 2016-03-18 ENCOUNTER — Other Ambulatory Visit: Payer: Self-pay | Admitting: Physician Assistant

## 2016-03-18 ENCOUNTER — Encounter: Payer: Self-pay | Admitting: Physician Assistant

## 2016-03-18 LAB — URINE CYTOLOGY ANCILLARY ONLY
Chlamydia: NEGATIVE
Neisseria Gonorrhea: POSITIVE — AB

## 2016-03-18 MED ORDER — AZITHROMYCIN 250 MG PO TABS: 1000.0000 mg | ORAL_TABLET | Freq: Once | ORAL | 0 refills | Status: AC

## 2016-03-19 LAB — CERVICOVAGINAL ANCILLARY ONLY: Wet Prep (BD Affirm): POSITIVE — AB

## 2016-03-21 ENCOUNTER — Ambulatory Visit (INDEPENDENT_AMBULATORY_CARE_PROVIDER_SITE_OTHER): Payer: BLUE CROSS/BLUE SHIELD | Admitting: Physician Assistant

## 2016-03-21 ENCOUNTER — Encounter: Payer: Self-pay | Admitting: Physician Assistant

## 2016-03-21 VITALS — BP 128/71 | HR 95 | Temp 98.2°F | Resp 16 | Ht 65.0 in | Wt 232.4 lb

## 2016-03-21 DIAGNOSIS — A549 Gonococcal infection, unspecified: Secondary | ICD-10-CM

## 2016-03-21 MED ORDER — CEFTRIAXONE SODIUM 250 MG IJ SOLR
250.0000 mg | Freq: Once | INTRAMUSCULAR | Status: AC
Start: 2016-03-21 — End: 2016-03-21
  Administered 2016-03-21: 250 mg via INTRAMUSCULAR

## 2016-03-21 NOTE — Progress Notes (Signed)
Pre visit review using our clinic review tool, if applicable. No additional management support is needed unless otherwise documented below in the visit note/SLS  

## 2016-03-21 NOTE — Progress Notes (Signed)
Patient presents to clinic today for treatment of recently diagnosed gonorrhea. Patient has already completed 1 g Azithromycin. Is here to discuss Rocephin shot. Patient has instructed her significant other to obtain testing and treatment at the health Dept since he is not established here. She states that he is endorsing no infidelity. States significant other is denying symptoms at present.   Past Medical History:  Diagnosis Date  . Asthma   . Migraine     Current Outpatient Prescriptions on File Prior to Visit  Medication Sig Dispense Refill  . albuterol (PROVENTIL HFA;VENTOLIN HFA) 108 (90 Base) MCG/ACT inhaler Inhale 2 puffs into the lungs every 6 (six) hours as needed for wheezing or shortness of breath. 1 Inhaler 5  . clindamycin (CLEOCIN) 2 % vaginal cream Place 1 Applicatorful vaginally at bedtime. Insert 1 Applicatorful of medication into the vagina once nightly x 7 nights 35 g 0  . amitriptyline (ELAVIL) 25 MG tablet Take 1 tablet (25 mg total) by mouth at bedtime. (Patient not taking: Reported on 03/21/2016) 30 tablet 2  . famotidine (PEPCID) 20 MG tablet Take 1 tablet (20 mg total) by mouth daily. (Patient not taking: Reported on 03/21/2016) 30 tablet 0  . omeprazole (PRILOSEC) 40 MG capsule Take 1 capsule (40 mg total) by mouth daily. (Patient not taking: Reported on 03/16/2016) 30 capsule 11  . rizatriptan (MAXALT-MLT) 10 MG disintegrating tablet Take 1 tablet (10 mg total) by mouth as needed for migraine. May repeat in 2 hours if needed (Patient not taking: Reported on 03/16/2016) 10 tablet 0   No current facility-administered medications on file prior to visit.     Allergies  Allergen Reactions  . Amoxil [Amoxicillin] Anaphylaxis  . Dulera [Mometasone Furo-Formoterol Fum] Hives    Family History  Problem Relation Age of Onset  . Heart disease Mother 35    MI  . Stroke Mother   . Breast cancer Maternal Grandmother   . Breast cancer Maternal Aunt   . Breast cancer  Paternal Grandmother   . Stroke Father   . Hypertension Father     Social History   Social History  . Marital status: Single    Spouse name: N/A  . Number of children: N/A  . Years of education: N/A   Occupational History  . customer service rep     Social History Main Topics  . Smoking status: Former Smoker    Packs/day: 0.25    Years: 9.00    Types: Cigarettes    Quit date: 02/14/2016  . Smokeless tobacco: Never Used  . Alcohol use 0.0 oz/week  . Drug use: No  . Sexual activity: No   Other Topics Concern  . None   Social History Narrative  . None    Review of Systems - See HPI.  All other ROS are negative.  BP 128/71 (BP Location: Left Arm, Patient Position: Sitting, Cuff Size: Large)   Pulse 95   Temp 98.2 F (36.8 C) (Oral)   Resp 16   Ht 5\' 5"  (1.651 m)   Wt 232 lb 6 oz (105.4 kg)   LMP 02/22/2016   SpO2 100%   BMI 38.67 kg/m   Physical Exam  Constitutional: She is well-developed, well-nourished, and in no distress.  HENT:  Head: Normocephalic and atraumatic.  Eyes: Conjunctivae are normal.  Neck: Neck supple.  Cardiovascular: Normal rate, regular rhythm, normal heart sounds and intact distal pulses.   Pulmonary/Chest: Effort normal and breath sounds normal. No respiratory distress. She  has no wheezes. She has no rales. She exhibits no tenderness.  Skin: Skin is warm and dry. No rash noted.  Psychiatric: Affect normal.  Vitals reviewed.   Recent Results (from the past 2160 hour(s))  Cervicovaginal ancillary only     Status: Abnormal   Collection Time: 03/16/16 12:00 AM  Result Value Ref Range   Wet Prep (BD Affirm) **POSITIVE for Gardnerella** (A)     Comment: Normal Reference Range - Negative  POCT urine pregnancy     Status: Normal   Collection Time: 03/16/16  7:31 PM  Result Value Ref Range   Preg Test, Ur Negative Negative  POCT urinalysis dipstick     Status: Normal   Collection Time: 03/16/16  7:33 PM  Result Value Ref Range   Color,  UA gold    Clarity, UA clear    Glucose, UA neg    Bilirubin, UA neg    Ketones, UA neg    Spec Grav, UA >=1.030    Blood, UA negative    pH, UA 6.0    Protein, UA neg    Urobilinogen, UA 0.2    Nitrite, UA neg    Leukocytes, UA Negative Negative  Urine cytology ancillary only     Status: Abnormal   Collection Time: 03/17/16 12:00 AM  Result Value Ref Range   Chlamydia Negative     Comment: Normal Reference Range - Negative   Neisseria gonorrhea **POSITIVE** (A)     Comment: Normal Reference Range - Negative    Assessment/Plan: 1. Gonorrhea Patient had Allergy documented to Amoxicillin listed as Anaphylaxis but patient denies this. Discussed allergy with patient. She noted itching of neck 5 years ago the last time she was on medication. Denies facial swelling, tongue swelling. SOB, etc. Denies reaction since then, mentioning use of penicillins since.  Discussed cross reactivity between Penicillins and Cephalosporins. Patient endorses tolerating these antibiotics previously without side effect. Discussed resistance of Gonorrhea. Patient to proceed with Rocephin injection. 250 mg IM given. Patient monitored for 30 minutes. Discussed alarm signs/symptoms with patient. Will obtain full STI panel. She is to abstain from sexual activity until she has test of cure and her significant other has been treated and tested. Will send report to health department.   - cefTRIAXone (ROCEPHIN) injection 250 mg; Inject 250 mg into the muscle once. - HIV antibody - RPR - Acute Hep Panel & Hep B Surface Ab   Piedad ClimesMartin, Ashten Prats Cody, PA-C

## 2016-03-21 NOTE — Patient Instructions (Addendum)
Please go to the lab for blood work.  I will call you with your results. Hopefull all will look good.  Follow-up 1 week to the lab for repeat gonorrhea testing. No sexual activity of any kind until you are successfully treated and your partner is as well.  Always practice safe sex.  Again you have endorsed itching with amoxicillin once before with no other issues. There is a low chance of cross-reactivity but I would recommend taking a benadryl to limit any chance of itching.

## 2016-03-22 LAB — RPR

## 2016-03-22 LAB — HIV ANTIBODY (ROUTINE TESTING W REFLEX): HIV: NONREACTIVE

## 2016-03-22 LAB — ACUTE HEP PANEL AND HEP B SURFACE AB
HCV Ab: NEGATIVE
HEP B C IGM: NONREACTIVE
HEP B S AG: NEGATIVE
Hep A IgM: NONREACTIVE
Hep B S Ab: POSITIVE — AB

## 2016-03-31 ENCOUNTER — Other Ambulatory Visit: Payer: Self-pay | Admitting: Physician Assistant

## 2016-03-31 ENCOUNTER — Encounter: Payer: Self-pay | Admitting: Physician Assistant

## 2016-03-31 ENCOUNTER — Telehealth: Payer: Self-pay

## 2016-03-31 DIAGNOSIS — A549 Gonococcal infection, unspecified: Secondary | ICD-10-CM

## 2016-03-31 NOTE — Telephone Encounter (Signed)
MyChart message sent to patient to assess her questions/concerns. Awaiting patient response. If she calls back again, please advise her that neither I nor my nurse are in office Thursdays but as stated in the MyChart message sent, I will be keeping an eye out for her response.

## 2016-03-31 NOTE — Telephone Encounter (Signed)
Patient is requesting a call back regarding her lab appointment for 04/04/16 she is at work an would prefer a Clinical cytogeneticistmychart conversation for privacy. Could not go into detail for the reason for the call back expect it is about the appointment she scheduled. Should like to speak to provider

## 2016-04-04 ENCOUNTER — Other Ambulatory Visit: Payer: BLUE CROSS/BLUE SHIELD

## 2016-04-04 ENCOUNTER — Other Ambulatory Visit (HOSPITAL_COMMUNITY)
Admission: RE | Admit: 2016-04-04 | Discharge: 2016-04-04 | Disposition: A | Discharge status: Home / Self Care | Payer: BLUE CROSS/BLUE SHIELD | Source: Ambulatory Visit | Attending: Family Medicine | Admitting: Family Medicine

## 2016-04-04 DIAGNOSIS — N760 Acute vaginitis: Secondary | ICD-10-CM | POA: Diagnosis not present

## 2016-04-05 LAB — GC/CHLAMYDIA PROBE AMP
CT PROBE, AMP APTIMA: NOT DETECTED
GC PROBE AMP APTIMA: NOT DETECTED

## 2016-04-05 LAB — URINE CYTOLOGY ANCILLARY ONLY: TRICH (WINDOWPATH): NEGATIVE

## 2016-04-06 ENCOUNTER — Encounter: Payer: Self-pay | Admitting: Physician Assistant

## 2016-04-07 ENCOUNTER — Encounter: Payer: Self-pay | Admitting: Physician Assistant

## 2016-04-07 LAB — URINE CYTOLOGY ANCILLARY ONLY
Bacterial vaginitis: NEGATIVE
CANDIDA VAGINITIS: NEGATIVE

## 2016-04-27 ENCOUNTER — Other Ambulatory Visit: Payer: Self-pay | Admitting: Physician Assistant

## 2016-04-27 DIAGNOSIS — N898 Other specified noninflammatory disorders of vagina: Secondary | ICD-10-CM

## 2016-06-07 ENCOUNTER — Ambulatory Visit: Payer: BLUE CROSS/BLUE SHIELD | Admitting: Family Medicine

## 2016-06-09 ENCOUNTER — Other Ambulatory Visit (HOSPITAL_COMMUNITY)
Admission: RE | Admit: 2016-06-09 | Discharge: 2016-06-09 | Disposition: A | Discharge status: Home / Self Care | Payer: BLUE CROSS/BLUE SHIELD | Source: Ambulatory Visit | Attending: Family Medicine | Admitting: Family Medicine

## 2016-06-09 ENCOUNTER — Encounter: Payer: Self-pay | Admitting: Family Medicine

## 2016-06-09 ENCOUNTER — Ambulatory Visit: Payer: BLUE CROSS/BLUE SHIELD | Admitting: Medical

## 2016-06-09 ENCOUNTER — Ambulatory Visit (INDEPENDENT_AMBULATORY_CARE_PROVIDER_SITE_OTHER): Payer: BLUE CROSS/BLUE SHIELD | Admitting: Family Medicine

## 2016-06-09 VITALS — BP 129/80 | HR 94 | Temp 98.1°F | Ht 64.5 in | Wt 229.0 lb

## 2016-06-09 DIAGNOSIS — N760 Acute vaginitis: Secondary | ICD-10-CM

## 2016-06-09 DIAGNOSIS — B9689 Other specified bacterial agents as the cause of diseases classified elsewhere: Secondary | ICD-10-CM | POA: Diagnosis not present

## 2016-06-09 DIAGNOSIS — R350 Frequency of micturition: Secondary | ICD-10-CM

## 2016-06-09 LAB — POC URINALSYSI DIPSTICK (AUTOMATED)
BILIRUBIN UA: NEGATIVE
Blood, UA: NEGATIVE
GLUCOSE UA: NEGATIVE
Ketones, UA: NEGATIVE
LEUKOCYTES UA: NEGATIVE
NITRITE UA: NEGATIVE
PH UA: 6
Spec Grav, UA: >=1.03
Urobilinogen, UA: >=8

## 2016-06-09 LAB — POCT URINE PREGNANCY: Preg Test, Ur: NEGATIVE

## 2016-06-09 MED ORDER — METRONIDAZOLE 500 MG PO TABS: 500.0000 mg | ORAL_TABLET | Freq: Two times a day (BID) | ORAL | 0 refills | Status: DC

## 2016-06-09 NOTE — Addendum Note (Signed)
Addended by: Kandace BlitzLONG, Aviel Davalos M on: 06/09/2016 04:47 PM   Modules accepted: Orders

## 2016-06-09 NOTE — Progress Notes (Signed)
Pre visit review using our clinic tool,if applicable. No additional management support is needed unless otherwise documented below in the visit note.  

## 2016-06-09 NOTE — Patient Instructions (Addendum)
Do not drink while on this medication.  Your urine looks normal.   We will contact you once we receive your results.  Schedule an appointment for your yearly exam with Dr. Zola ButtonLowne-Chase.

## 2016-06-09 NOTE — Progress Notes (Signed)
Chief Complaint  Patient presents with  . Vaginal Discharge    No odor, lower back pain,cramping     Sherry BumpsJessica Villegas is a 30 y.o. female here for vaginal discharge.  Duration: 1 days Description of discharge: no odor, She has a history of bacterial vaginosis infections and states it is similar to that Odor: No  New sexual partner: No- she is sexually active with her current boyfriend who has untreated gonorrhea. Using condoms but she is concerned she may have been infected again. Urinary complaints: Yes- frequency IUD? No, uses condoms Denies fevers, bleeding, pregnancy abdominal pain, external skin lesions. Last period was 05/16/16.  ROS:  GU: +discharge, denies pain with urination  Past Medical History:  Diagnosis Date  . Asthma   . Migraine    Family History  Problem Relation Age of Onset  . Heart disease Mother 11044    MI  . Stroke Mother   . Breast cancer Maternal Grandmother   . Breast cancer Maternal Aunt   . Breast cancer Paternal Grandmother   . Stroke Father   . Hypertension Father     BP 129/80   Pulse 94   Temp 98.1 F (36.7 C) (Oral)   Ht 5' 4.5" (1.638 m)   Wt 229 lb (103.9 kg)   LMP 05/16/2016   SpO2 100%   BMI 38.70 kg/m  Gen: Awake, alert, appears stated age Heart: RRR, no murmurs Lungs: CTAB, no accessory muscle use Abd: BS+, soft, Mild TTP in suprapubic region, ND, no masses or organomegaly Psych: Age appropriate judgment and insight, nml mood and affect  BV (bacterial vaginosis) - Plan: metroNIDAZOLE (FLAGYL) 500 MG tablet, Urine cytology ancillary only, POCT Urinalysis Dipstick (Automated)  Orders as above. Do not drink while on this medication. If she does not improve and laboratory studies are unremarkable, I will need to perform a pelvic exam. Follow-up with regular PCP for complete physical. Pt voiced understanding and agreement to the plan.  Sherry Rocheicholas Paul Lake Carm[place], DO 06/09/16 4:05 PM

## 2016-06-13 LAB — URINE CYTOLOGY ANCILLARY ONLY
CHLAMYDIA, DNA PROBE: NEGATIVE
NEISSERIA GONORRHEA: NEGATIVE
TRICH (WINDOWPATH): NEGATIVE

## 2016-06-15 ENCOUNTER — Encounter: Payer: Self-pay | Admitting: Family Medicine

## 2016-06-15 LAB — URINE CYTOLOGY ANCILLARY ONLY
Bacterial vaginitis: POSITIVE — AB
CANDIDA VAGINITIS: NEGATIVE

## 2016-10-13 ENCOUNTER — Ambulatory Visit (INDEPENDENT_AMBULATORY_CARE_PROVIDER_SITE_OTHER): Payer: BLUE CROSS/BLUE SHIELD | Admitting: Family Medicine

## 2016-10-13 ENCOUNTER — Encounter: Payer: Self-pay | Admitting: Family Medicine

## 2016-10-13 VITALS — BP 102/80 | HR 105 | Temp 98.2°F | Resp 16 | Ht 64.5 in | Wt 222.2 lb

## 2016-10-13 DIAGNOSIS — J449 Chronic obstructive pulmonary disease, unspecified: Secondary | ICD-10-CM | POA: Diagnosis not present

## 2016-10-13 MED ORDER — AZITHROMYCIN 250 MG PO TABS: ORAL_TABLET | ORAL | 0 refills | Status: DC

## 2016-10-13 MED ORDER — METHYLPREDNISOLONE ACETATE 80 MG/ML IJ SUSP
80.0000 mg | Freq: Once | INTRAMUSCULAR | Status: AC
  Administered 2016-10-13: 80 mg via INTRAMUSCULAR

## 2016-10-13 MED ORDER — MOMETASONE FUROATE 220 MCG/INH IN AEPB: 2.0000 | INHALATION_SPRAY | Freq: Every day | RESPIRATORY_TRACT | 12 refills | Status: DC

## 2016-10-13 MED ORDER — ALBUTEROL SULFATE (2.5 MG/3ML) 0.083% IN NEBU
2.5000 mg | INHALATION_SOLUTION | Freq: Once | RESPIRATORY_TRACT | Status: AC
  Administered 2016-10-13: 2.5 mg via RESPIRATORY_TRACT

## 2016-10-13 MED ORDER — PROMETHAZINE-DM 6.25-15 MG/5ML PO SYRP: 5.0000 mL | ORAL_SOLUTION | Freq: Four times a day (QID) | ORAL | 0 refills | Status: DC | PRN

## 2016-10-13 MED ORDER — FLUTICASONE PROPIONATE 50 MCG/ACT NA SUSP: 2.0000 | Freq: Every day | NASAL | 6 refills | Status: DC

## 2016-10-13 NOTE — Progress Notes (Signed)
Pre visit review using our clinic review tool, if applicable. No additional management support is needed unless otherwise documented below in the visit note. 

## 2016-10-13 NOTE — Addendum Note (Signed)
Addended by: Vergie Living on: 10/13/2016 01:55 PM   Modules accepted: Orders

## 2016-10-13 NOTE — Progress Notes (Signed)
Subjective:  I acted as a Neurosurgeon for Dr. Delman Kitten, LPN     Patient ID: Sherry Villegas, female    DOB: [birth year], 31 y.o.   MRN: [id number]  Chief Complaint  Patient presents with  . Cough    Cough  This is a new problem. The current episode started in the past 7 days. Associated symptoms include wheezing. Pertinent negatives include no chest pain, fever, headaches or rash.    Patient is in today for c/o cough, and SOB that has been there since Monday. Patient report she has asthma. Patient report she has been taking mucinex. She ran out of there inhaler and could not afford to pick it up  Patient Care Team: Donato Schultz, DO as PCP - General (Family Medicine)   Past Medical History:  Diagnosis Date  . Asthma   . Migraine     No past surgical history on file.  Family History  Problem Relation Age of Onset  . Heart disease Mother 25    MI  . Stroke Mother   . Breast cancer Maternal Grandmother   . Breast cancer Maternal Aunt   . Breast cancer Paternal Grandmother   . Stroke Father   . Hypertension Father     Social History   Social History  . Marital status: Single    Spouse name: N/A  . Number of children: N/A  . Years of education: N/A   Occupational History  . customer service rep     Social History Main Topics  . Smoking status: Former Smoker    Packs/day: 0.25    Years: 9.00    Types: Cigarettes    Quit date: 02/14/2016  . Smokeless tobacco: Never Used  . Alcohol use 0.0 oz/week  . Drug use: No  . Sexual activity: No   Other Topics Concern  . Not on file   Social History Narrative  . No narrative on file    Outpatient Medications Prior to Visit  Medication Sig Dispense Refill  . acetaminophen (TYLENOL) 325 MG tablet Take 650 mg by mouth every 6 (six) hours as needed.    Marland Kitchen albuterol (PROVENTIL HFA;VENTOLIN HFA) 108 (90 Base) MCG/ACT inhaler Inhale 2 puffs into the lungs every 6 (six) hours as needed for wheezing or shortness of  breath. 1 Inhaler 5  . clindamycin (CLEOCIN) 2 % vaginal cream Place 1 Applicatorful vaginally at bedtime. Insert 1 Applicatorful of medication into the vagina once nightly x 7 nights 35 g 0  . amitriptyline (ELAVIL) 25 MG tablet Take 1 tablet (25 mg total) by mouth at bedtime. (Patient not taking: Reported on 10/13/2016) 30 tablet 2  . famotidine (PEPCID) 20 MG tablet Take 1 tablet (20 mg total) by mouth daily. (Patient not taking: Reported on 10/13/2016) 30 tablet 0  . omeprazole (PRILOSEC) 40 MG capsule Take 1 capsule (40 mg total) by mouth daily. (Patient not taking: Reported on 10/13/2016) 30 capsule 11  . rizatriptan (MAXALT-MLT) 10 MG disintegrating tablet Take 1 tablet (10 mg total) by mouth as needed for migraine. May repeat in 2 hours if needed (Patient not taking: Reported on 10/13/2016) 10 tablet 0  . metroNIDAZOLE (FLAGYL) 500 MG tablet Take 1 tablet (500 mg total) by mouth 2 (two) times daily. (Patient not taking: Reported on 10/13/2016) 14 tablet 0   No facility-administered medications prior to visit.     Allergies  Allergen Reactions  . Dulera [Mometasone Furo-Formoterol Fum] Hives  . Amoxil [Amoxicillin] Itching  Review of Systems  Constitutional: Negative for fever.  HENT: Negative for congestion.   Eyes: Negative for blurred vision.  Respiratory: Positive for cough and wheezing.   Cardiovascular: Negative for chest pain and palpitations.  Gastrointestinal: Negative for vomiting.  Musculoskeletal: Negative for back pain.  Skin: Negative for rash.  Neurological: Negative for loss of consciousness and headaches.       Objective:    Physical Exam  Constitutional: She is oriented to person, place, and time. She appears well-developed and well-nourished. No distress.  HENT:  Head: Normocephalic and atraumatic.  Nose: Rhinorrhea present. Right sinus exhibits frontal sinus tenderness. Left sinus exhibits frontal sinus tenderness.  Mouth/Throat: Posterior oropharyngeal  erythema present.  Eyes: Conjunctivae are normal. Pupils are equal, round, and reactive to light.  Neck: Normal range of motion. No thyromegaly present.  Cardiovascular: Normal rate and regular rhythm.   Pulmonary/Chest: Effort normal. No respiratory distress. She has wheezes. She has no rales. She exhibits no tenderness.  Abdominal: Soft. Bowel sounds are normal. There is no tenderness.  Musculoskeletal: Normal range of motion. She exhibits no edema or deformity.  Neurological: She is alert and oriented to person, place, and time.  Skin: Skin is warm and dry. She is not diaphoretic.  Psychiatric: She has a normal mood and affect.  Nursing note and vitals reviewed.   BP 102/80 (BP Location: Right Arm, Patient Position: Sitting, Cuff Size: Large)   Pulse (!) 105   Temp 98.2 F (36.8 C) (Oral)   Resp 16   Ht 5' 4.5" (1.638 m)   Wt 222 lb 3.2 oz (100.8 kg)   SpO2 97%   BMI 37.55 kg/m  Wt Readings from Last 3 Encounters:  10/13/16 222 lb 3.2 oz (100.8 kg)  06/09/16 229 lb (103.9 kg)  03/21/16 232 lb 6 oz (105.4 kg)   BP Readings from Last 3 Encounters:  10/13/16 102/80  06/09/16 129/80  03/21/16 128/71      There is no immunization history on file for this patient.  Health Maintenance  Topic Date Due  . TETANUS/TDAP  05/13/2006  . PAP SMEAR  10/06/2015  . INFLUENZA VACCINE  02/08/2017  . HIV Screening  Completed    Lab Results  Component Value Date   WBC 5.1 08/25/2015   HGB 11.6 (L) 08/25/2015   HCT 35.8 (L) 08/25/2015   PLT 410.0 (H) 08/25/2015   GLUCOSE 91 08/25/2015   CHOL 200 08/25/2015   TRIG 118.0 08/25/2015   HDL 72.00 08/25/2015   LDLCALC 105 (H) 08/25/2015   ALT 12 08/25/2015   AST 16 08/25/2015   NA 139 08/25/2015   K 3.8 08/25/2015   CL 105 08/25/2015   CREATININE 0.91 08/25/2015   BUN 9 08/25/2015   CO2 29 08/25/2015   TSH 1.72 08/25/2015   MICROALBUR 7.0 (H) 08/25/2015    Lab Results  Component Value Date   TSH 1.72 08/25/2015   Lab  Results  Component Value Date   WBC 5.1 08/25/2015   HGB 11.6 (L) 08/25/2015   HCT 35.8 (L) 08/25/2015   MCV 77.9 (L) 08/25/2015   PLT 410.0 (H) 08/25/2015   Lab Results  Component Value Date   NA 139 08/25/2015   K 3.8 08/25/2015   CO2 29 08/25/2015   GLUCOSE 91 08/25/2015   BUN 9 08/25/2015   CREATININE 0.91 08/25/2015   BILITOT 0.2 08/25/2015   ALKPHOS 64 08/25/2015   AST 16 08/25/2015   ALT 12 08/25/2015   PROT 7.5 08/25/2015  ALBUMIN 4.0 08/25/2015   CALCIUM 9.4 08/25/2015   GFR 94.47 08/25/2015   Lab Results  Component Value Date   CHOL 200 08/25/2015   Lab Results  Component Value Date   HDL 72.00 08/25/2015   Lab Results  Component Value Date   LDLCALC 105 (H) 08/25/2015   Lab Results  Component Value Date   TRIG 118.0 08/25/2015   Lab Results  Component Value Date   CHOLHDL 3 08/25/2015   No results found for: HGBA1C       Assessment & Plan:   Problem List Items Addressed This Visit      Unprioritized   Asthmatic bronchitis , chronic (HCC) - Primary   Relevant Medications   albuterol (PROVENTIL) (2.5 MG/3ML) 0.083% nebulizer solution 2.5 mg (Completed)   azithromycin (ZITHROMAX) 250 MG tablet   promethazine-dextromethorphan (PROMETHAZINE-DM) 6.25-15 MG/5ML syrup   fluticasone (FLONASE) 50 MCG/ACT nasal spray   mometasone (ASMANEX 60 METERED DOSES) 220 MCG/INH inhaler     if no better over next 3-4 days rto  con't albuterol prn   I have discontinued Ms. Maenza's clindamycin and metroNIDAZOLE. I am also having her start on azithromycin, promethazine-dextromethorphan, and fluticasone. Additionally, I am having her maintain her amitriptyline, omeprazole, famotidine, rizatriptan, albuterol, acetaminophen, and mometasone. We administered albuterol.  Meds ordered this encounter  Medications  . albuterol (PROVENTIL) (2.5 MG/3ML) 0.083% nebulizer solution 2.5 mg  . azithromycin (ZITHROMAX) 250 MG tablet    Sig: Take 2 tab po the first day then  take 1 tablet po daily for 4 days    Dispense:  6 tablet    Refill:  0  . promethazine-dextromethorphan (PROMETHAZINE-DM) 6.25-15 MG/5ML syrup    Sig: Take 5 mLs by mouth 4 (four) times daily as needed for cough.    Dispense:  118 mL    Refill:  0  . fluticasone (FLONASE) 50 MCG/ACT nasal spray    Sig: Place 2 sprays into both nostrils daily.    Dispense:  16 g    Refill:  6  . DISCONTD: mometasone (ASMANEX 60 METERED DOSES) 220 MCG/INH inhaler    Sig: Inhale 2 puffs into the lungs daily.    Dispense:  1 Inhaler    Refill:  12  . mometasone (ASMANEX 60 METERED DOSES) 220 MCG/INH inhaler    Sig: Inhale 2 puffs into the lungs daily.    Dispense:  1 Inhaler    Refill:  12    CMA served as scribe during this visit. History, Physical and Plan performed by medical provider. Documentation and orders reviewed and attested to.  Donato Schultz, DO  Patient ID: Elouise Divelbiss, female   DOB: 1985/09/07, 31 y.o.   MRN: [id number]

## 2016-10-13 NOTE — Patient Instructions (Signed)

## 2016-10-14 ENCOUNTER — Telehealth: Payer: Self-pay | Admitting: Family Medicine

## 2016-10-14 ENCOUNTER — Encounter: Payer: Self-pay | Admitting: Family Medicine

## 2016-10-14 NOTE — Telephone Encounter (Signed)
Called [phone] to initiate PA for asmanex Gained approval from today 10/14/16 through 10/14/2017 [facility] pharmacy and they will let the patient know. Pharmacy stated patient had picked up already and used a coupon card, but PA would be helpful for continuation of refills.Marland Kitchen

## 2016-10-14 NOTE — Telephone Encounter (Signed)
Letter completed. Attempted to call the patient but number not taking calls. Put the letter at the front desk for pickup

## 2016-10-14 NOTE — Telephone Encounter (Signed)
Ok to give note 

## 2016-10-14 NOTE — Telephone Encounter (Signed)
Caller name: Sherry Villegas Relation to pt: self Call back number: [phone] Pharmacy:  Reason for call: Pt states was seen in our office yesterday 10-13-2016 (coughing) and is needing a work note starting from 10-11-2016 or 10-12-2016 (if possible) and returning to work on 10-18-2016 (pt had work 10-14-16. Pt states Boh[place] not feeling very well and could not pick up meds since didn't have money to pay for meds, pt stated does not work on Sunday nor Monday and that is why asking to return on Tuesday 10-18-2016. Please advise.

## 2017-01-31 ENCOUNTER — Telehealth: Payer: Self-pay | Admitting: Family Medicine

## 2017-01-31 DIAGNOSIS — B9689 Other specified bacterial agents as the cause of diseases classified elsewhere: Secondary | ICD-10-CM

## 2017-01-31 DIAGNOSIS — N760 Acute vaginitis: Principal | ICD-10-CM

## 2017-01-31 NOTE — Telephone Encounter (Signed)
Relation to ZO:XWRUpt:self Call back [phone] Pharmacy:  [facility] [place], [place] [id number] [facility] [phone] (Phone) [phone])     Reason for call:  Patient requesting metronidazole for vaginal infection. Experiencing vaginal ordor, please advise

## 2017-02-01 MED ORDER — METRONIDAZOLE 500 MG PO TABS: 500.0000 mg | ORAL_TABLET | Freq: Two times a day (BID) | ORAL | 0 refills | Status: DC

## 2017-02-01 NOTE — Telephone Encounter (Signed)
Rx sent. [facility] Pt informed she will need to see PCP if sx's persist. Pt verbalized understanding.

## 2017-02-01 NOTE — Telephone Encounter (Signed)
Ok to refill x1-- will need ov if no improvement

## 2017-02-01 NOTE — Telephone Encounter (Signed)
Looks like patient has a history of recurrent BV, would you like to send this in or do you want her to make an appt?

## 2017-02-06 ENCOUNTER — Ambulatory Visit (INDEPENDENT_AMBULATORY_CARE_PROVIDER_SITE_OTHER): Payer: BLUE CROSS/BLUE SHIELD | Admitting: Family Medicine

## 2017-02-06 ENCOUNTER — Encounter: Payer: Self-pay | Admitting: Family Medicine

## 2017-02-06 DIAGNOSIS — K219 Gastro-esophageal reflux disease without esophagitis: Secondary | ICD-10-CM | POA: Diagnosis not present

## 2017-02-06 DIAGNOSIS — N760 Acute vaginitis: Secondary | ICD-10-CM | POA: Diagnosis not present

## 2017-02-06 MED ORDER — FAMOTIDINE 20 MG PO TABS: 20.0000 mg | ORAL_TABLET | Freq: Every day | ORAL | 0 refills | Status: DC

## 2017-02-06 MED ORDER — OMEPRAZOLE 40 MG PO CPDR: 40.0000 mg | DELAYED_RELEASE_CAPSULE | Freq: Every day | ORAL | 11 refills | Status: DC

## 2017-02-06 MED ORDER — INSULIN PEN NEEDLE 32G X 4 MM MISC: 1 refills | Status: DC

## 2017-02-06 MED ORDER — LIRAGLUTIDE -WEIGHT MANAGEMENT 18 MG/3ML ~~LOC~~ SOPN: 3.0000 mg | PEN_INJECTOR | Freq: Every day | SUBCUTANEOUS | 5 refills | Status: DC

## 2017-02-06 MED ORDER — FLUCONAZOLE 150 MG PO TABS: 150.0000 mg | ORAL_TABLET | Freq: Once | ORAL | 0 refills | Status: AC

## 2017-02-06 NOTE — Progress Notes (Signed)
Patient ID: Sherry Villegas, female    DOB: 08/19/1985  Age: 31 y.o. MRN: [id number]    Subjective:  Subjective  HPI Sherry Villegas presents to discuss weight loss.  She is requesting phenteramine .  She states she is trying to watch her diet.  Not much exercise because she feels uncomfortable at the gym  Review of Systems  Constitutional: Negative for appetite change, diaphoresis, fatigue and unexpected weight change.  Eyes: Negative for pain, redness and visual disturbance.  Respiratory: Negative for cough, chest tightness, shortness of breath and wheezing.   Cardiovascular: Negative for chest pain, palpitations and leg swelling.  Endocrine: Negative for cold intolerance, heat intolerance, polydipsia, polyphagia and polyuria.  Genitourinary: Negative for difficulty urinating, dysuria and frequency.  Neurological: Negative for dizziness, light-headedness, numbness and headaches.    History Past Medical History:  Diagnosis Date  . Asthma   . Migraine     She has no past surgical history on file.   Her family history includes Breast cancer in her maternal aunt, maternal grandmother, and paternal grandmother; Heart disease (age of onset: 5844) in her mother; Hypertension in her father; Stroke in her father and mother.She reports that she quit smoking about a year ago. Her smoking use included Cigarettes. She has a 2.25 pack-year smoking history. She has never used smokeless tobacco. She reports that she drinks alcohol. She reports that she does not use drugs.  Current Outpatient Prescriptions on File Prior to Visit  Medication Sig Dispense Refill  . acetaminophen (TYLENOL) 325 MG tablet Take 650 mg by mouth every 6 (six) hours as needed.    Marland Kitchen. albuterol (PROVENTIL HFA;VENTOLIN HFA) 108 (90 Base) MCG/ACT inhaler Inhale 2 puffs into the lungs every 6 (six) hours as needed for wheezing or shortness of breath. 1 Inhaler 5  . amitriptyline (ELAVIL) 25 MG tablet Take 1 tablet (25 mg total) by  mouth at bedtime. 30 tablet 2  . metroNIDAZOLE (FLAGYL) 500 MG tablet Take 1 tablet (500 mg total) by mouth 2 (two) times daily. 14 tablet 0  . rizatriptan (MAXALT-MLT) 10 MG disintegrating tablet Take 1 tablet (10 mg total) by mouth as needed for migraine. May repeat in 2 hours if needed 10 tablet 0   No current facility-administered medications on file prior to visit.      Objective:  Objective  Physical Exam  Constitutional: She is oriented to person, place, and time. She appears well-developed and well-nourished.  HENT:  Head: Normocephalic and atraumatic.  Eyes: Conjunctivae and EOM are normal.  Neck: Normal range of motion. Neck supple. No JVD present. Carotid bruit is not present. No thyromegaly present.  Cardiovascular: Normal rate, regular rhythm and normal heart sounds.   No murmur heard. Pulmonary/Chest: Effort normal and breath sounds normal. No respiratory distress. She has no wheezes. She has no rales. She exhibits no tenderness.  Musculoskeletal: She exhibits no edema.  Neurological: She is alert and oriented to person, place, and time.  Psychiatric: She has a normal mood and affect. Her behavior is normal. Judgment and thought content normal.  Nursing note and vitals reviewed.  BP 110/72 (BP Location: Left Arm, Patient Position: Sitting, Cuff Size: Large)   Pulse 83   Temp 98.3 F (36.8 C) (Oral)   Ht 5\' 5"  (1.651 m)   Wt 232 lb 4 oz (105.3 kg)   SpO2 98%   BMI 38.65 kg/m  Wt Readings from Last 3 Encounters:  02/06/17 232 lb 4 oz (105.3 kg)  10/13/16 222 lb  3.2 oz (100.8 kg)  06/09/16 229 lb (103.9 kg)     Lab Results  Component Value Date   WBC 5.1 08/25/2015   HGB 11.6 (L) 08/25/2015   HCT 35.8 (L) 08/25/2015   PLT 410.0 (H) 08/25/2015   GLUCOSE 91 08/25/2015   CHOL 200 08/25/2015   TRIG 118.0 08/25/2015   HDL 72.00 08/25/2015   LDLCALC 105 (H) 08/25/2015   ALT 12 08/25/2015   AST 16 08/25/2015   NA 139 08/25/2015   K 3.8 08/25/2015   CL 105  08/25/2015   CREATININE 0.91 08/25/2015   BUN 9 08/25/2015   CO2 29 08/25/2015   TSH 1.72 08/25/2015   MICROALBUR 7.0 (H) 08/25/2015    No results found.   Assessment & Plan:  Plan  I have discontinued Ms. Matera's azithromycin, promethazine-dextromethorphan, fluticasone, and mometasone. I am also having her start on Liraglutide -Weight Management, fluconazole, and Insulin Pen Needle. Additionally, I am having her maintain her amitriptyline, rizatriptan, albuterol, acetaminophen, metroNIDAZOLE, famotidine, and omeprazole.  Meds ordered this encounter  Medications  . famotidine (PEPCID) 20 MG tablet    Sig: Take 1 tablet (20 mg total) by mouth daily.    Dispense:  30 tablet    Refill:  0  . omeprazole (PRILOSEC) 40 MG capsule    Sig: Take 1 capsule (40 mg total) by mouth daily.    Dispense:  30 capsule    Refill:  11  . Liraglutide -Weight Management (SAXENDA) 18 MG/3ML SOPN    Sig: Inject 3 mg into the skin daily.    Dispense:  3 mL    Refill:  5  . fluconazole (DIFLUCAN) 150 MG tablet    Sig: Take 1 tablet (150 mg total) by mouth once.    Dispense:  1 tablet    Refill:  0  . Insulin Pen Needle (BD PEN NEEDLE NANO U/F) 32G X 4 MM MISC    Sig: Use once a day with Saxenda    Dispense:  100 each    Refill:  1    Problem List Items Addressed This Visit      Unprioritized   Morbid obesity (HCC) - Primary    Encouraged diet and exercise Explained dangers of phenteramine Will give sample of saxenda and pt instructed how to use it Also referred to healthy eating/ wellness program -- pt given number rto 1 month if unable to get into program by then      Relevant Medications   Liraglutide -Weight Management (SAXENDA) 18 MG/3ML SOPN    Other Visit Diagnoses    Gastroesophageal reflux disease, esophagitis presence not specified       Relevant Medications   famotidine (PEPCID) 20 MG tablet   omeprazole (PRILOSEC) 40 MG capsule   Acute vaginitis       Relevant Medications     fluconazole (DIFLUCAN) 150 MG tablet      Follow-up: Return in about 3 months (around 05/09/2017), or weight check.  Donato SchultzYvonne R Lowne Chase, DO

## 2017-02-06 NOTE — Patient Instructions (Signed)

## 2017-02-06 NOTE — Assessment & Plan Note (Signed)
Encouraged diet and exercise Explained dangers of phenteramine Will give sample of saxenda and pt instructed how to use it Also referred to healthy eating/ wellness program -- pt given number rto 1 month if unable to get into program by then

## 2017-02-06 NOTE — Progress Notes (Signed)
Pre visit review using our clinic review tool, if applicable. No additional management support is needed unless otherwise documented below in the visit note. 

## 2017-03-14 ENCOUNTER — Telehealth: Payer: Self-pay | Admitting: Family Medicine

## 2017-03-14 NOTE — Telephone Encounter (Signed)
Relation to NF:AOZHpt:self Call back number:[phone] [facility] [place], [place] - [address] [phone] (Phone) [phone])     Reason for call:  Patient inquiring what insurance will cover Liraglutide -Weight Management (SAXENDA) 18 MG/3ML SOPN,please advise

## 2017-03-15 NOTE — Telephone Encounter (Signed)
Patient informed it is best for her to call her insurance to find out if they cover/and for which/what diagnosis She verbalized understanding.

## 2017-04-01 ENCOUNTER — Other Ambulatory Visit: Payer: Self-pay | Admitting: Family Medicine

## 2017-04-01 DIAGNOSIS — B9689 Other specified bacterial agents as the cause of diseases classified elsewhere: Secondary | ICD-10-CM

## 2017-04-01 DIAGNOSIS — N760 Acute vaginitis: Principal | ICD-10-CM

## 2017-04-17 ENCOUNTER — Encounter: Payer: BLUE CROSS/BLUE SHIELD | Admitting: Family Medicine

## 2017-05-25 ENCOUNTER — Telehealth: Payer: Self-pay | Admitting: Family Medicine

## 2017-05-25 NOTE — Telephone Encounter (Signed)
[facility] [place] Day - Client TELEPHONE ADVICE RECORD [facility] Patient Name: Laquita Vohra DOB: 01/09/1986 Initial Comment Caller states she is returning a call from a triage nurse but it looks like she already spoke with a triage nurse but is calling back to report she was cramping and spotting on Monday and is [place] experiencing frequent urination and cramping in her middle back and is wandering if these symptoms have anything to do with the symptoms she experienced on Monday. Nurse Assessment Nurse: Ladona RidgelGaddy, RN, Felicia Date/Time (Eastern Time): 05/25/2017 4:44:16 PM Confirm and document reason for call. If symptomatic, describe symptoms. ---Pt said she is spotting vaginally just on Monday afternoon and a little on Tues am. Not time for her period - it was the last week of Oct and ended Nov 4. no fever. Clear vaginal disharge. No birth control. She had bumps that are itchy onset last night - 1 spot - looks like herpes she had in the past and she got valtrex last night Does the patient have any new or worsening symptoms? ---Yes Will a triage be completed? ---Yes Related visit to physician within the last 2 weeks? ---No Does the PT have any chronic conditions? (i.e. diabetes, asthma, etc.) ---Yes List chronic conditions. ---asthma Is the patient pregnant or possibly pregnant? (Ask all females between the ages of 2312-55) ---No Is this a behavioral health or substance abuse call? ---No Guidelines Guideline Title Affirmed Question Affirmed Notes Vaginal Discharge Genital area looks infected (e.g., draining sore, spreading redness) Final Disposition User See Physician within [address]24 Hours [place]Gaddy, RN, Sunny SchleinFelicia Comments See Call [phone] - pt was triaged to see MD in 24 h. Provided her with [facility] her email based on the triage above Triage was on Call [phone] Told her if telehealth cant help her go to UC or ER  within 24 h - she declined appt bc of work schedule - triaged on both call records.Referrals GO TO FACILITY UNDECIDED Caller Disagree/Comply Comply Caller Understands Yes PreDisposition Did not know what to do Call Id: [phone]

## 2017-05-25 NOTE — Telephone Encounter (Signed)
[facility] [place] Day - Client TELEPHONE ADVICE RECORD [facility] Patient Name: Sherry Villegas DOB: 09/14/1985 Initial Comment Caller is experiencing frequency after taking medication. Nurse Assessment Nurse: Ladona RidgelGaddy, RN, Felicia Date/Time (Eastern Time): 05/25/2017 3:00:03 PM Confirm and document reason for call. If symptomatic, describe symptoms. ---PT is on a medication valtrex and she thinks it is causing urinary frequency - 9x since this am. No fever. This was a refill. Valtrex is helping some. Does the patient have any new or worsening symptoms? ---Yes Will a triage be completed? ---Yes Related visit to physician within the last 2 weeks? ---Yes Does the PT have any chronic conditions? (i.e. diabetes, asthma, etc.) ---Yes List chronic conditions. ---asthma Is the patient pregnant or possibly pregnant? (Ask all females between the ages of 6112-55) ---No Is this a behavioral health or substance abuse call? ---No Guidelines Guideline Title Affirmed Question Affirmed Notes Vaginal Discharge Genital area looks infected (e.g., draining sore, spreading redness) Urinary Symptoms Side (flank) or lower back pain present Final Disposition User See Physician within 24 Hours [place]addy, RN, Sunny SchleinFelicia Comments called her back to see if she wants telehealth option and no answer. Left her message to call back and ask for this nurse. Referrals GO TO FACILITY UNDECIDED Caller Disagree/Comply Comply Caller Understands Yes PreDisposition Did not know what to do Call Id: [phone]

## 2017-07-28 ENCOUNTER — Ambulatory Visit: Payer: Self-pay | Admitting: Family Medicine

## 2017-08-01 ENCOUNTER — Ambulatory Visit: Payer: Self-pay | Admitting: Family Medicine

## 2017-08-03 ENCOUNTER — Encounter: Payer: Self-pay | Admitting: Family Medicine

## 2017-09-19 ENCOUNTER — Other Ambulatory Visit (HOSPITAL_COMMUNITY)
Admission: RE | Admit: 2017-09-19 | Discharge: 2017-09-19 | Disposition: A | Discharge status: Home / Self Care | Payer: 59 | Source: Ambulatory Visit | Attending: Family Medicine | Admitting: Family Medicine

## 2017-09-19 ENCOUNTER — Ambulatory Visit (INDEPENDENT_AMBULATORY_CARE_PROVIDER_SITE_OTHER): Payer: 59 | Admitting: Family Medicine

## 2017-09-19 ENCOUNTER — Encounter: Payer: Self-pay | Admitting: Family Medicine

## 2017-09-19 VITALS — BP 133/98 | HR 84 | Temp 98.2°F | Resp 16 | Ht 64.96 in | Wt 235.6 lb

## 2017-09-19 DIAGNOSIS — N898 Other specified noninflammatory disorders of vagina: Secondary | ICD-10-CM | POA: Insufficient documentation

## 2017-09-19 DIAGNOSIS — B9689 Other specified bacterial agents as the cause of diseases classified elsewhere: Secondary | ICD-10-CM | POA: Diagnosis not present

## 2017-09-19 DIAGNOSIS — J449 Chronic obstructive pulmonary disease, unspecified: Secondary | ICD-10-CM

## 2017-09-19 DIAGNOSIS — K219 Gastro-esophageal reflux disease without esophagitis: Secondary | ICD-10-CM

## 2017-09-19 LAB — POC URINALSYSI DIPSTICK (AUTOMATED)
Bilirubin, UA: NEGATIVE
Blood, UA: NEGATIVE
Glucose, UA: NEGATIVE
KETONES UA: NEGATIVE
LEUKOCYTES UA: NEGATIVE
NITRITE UA: NEGATIVE
Protein, UA: NEGATIVE
Spec Grav, UA: 1.02 (ref 1.010–1.025)
Urobilinogen, UA: 0.2 E.U./dL
pH, UA: 6 (ref 5.0–8.0)

## 2017-09-19 LAB — POCT URINE PREGNANCY: Preg Test, Ur: NEGATIVE

## 2017-09-19 MED ORDER — FAMOTIDINE 20 MG PO TABS: 20.0000 mg | ORAL_TABLET | Freq: Every day | ORAL | 0 refills | Status: DC

## 2017-09-19 MED ORDER — METRONIDAZOLE 500 MG PO TABS: 500.0000 mg | ORAL_TABLET | Freq: Two times a day (BID) | ORAL | 0 refills | Status: DC

## 2017-09-19 NOTE — Progress Notes (Signed)
Subjective:  I acted as a Neurosurgeon for [employer]. Fuller Song, RMA   Patient ID: Sherry Villegas, female    DOB: 1986-04-03, 32 y.o.   MRN: [id number]  Chief Complaint  Patient presents with  . Vaginal Discharge    HPI  Patient is in today for vaginal discharge.  She has been using monistat with no relief.    Patient Care Team: Zola Button, Grayling Congress, DO as PCP - General (Family Medicine)   Past Medical History:  Diagnosis Date  . Asthma   . Migraine     No past surgical history on file.  Family History  Problem Relation Age of Onset  . Heart disease Mother 22       MI  . Stroke Mother   . Stroke Father   . Hypertension Father   . Breast cancer Maternal Grandmother   . Breast cancer Maternal Aunt   . Breast cancer Paternal Grandmother     Social History   Socioeconomic History  . Marital status: Single    Spouse name: Not on file  . Number of children: Not on file  . Years of education: Not on file  . Highest education level: Not on file  Social Needs  . Financial resource strain: Not on file  . Food insecurity - worry: Not on file  . Food insecurity - inability: Not on file  . Transportation needs - medical: Not on file  . Transportation needs - non-medical: Not on file  Occupational History  . Occupation: customer service rep   Tobacco Use  . Smoking status: Former Smoker    Packs/day: 0.25    Years: 9.00    Pack years: 2.25    Types: Cigarettes    Last attempt to quit: 02/14/2016    Years since quitting: 1.5  . Smokeless tobacco: Never Used  Substance and Sexual Activity  . Alcohol use: Yes    Alcohol/week: 0.0 oz  . Drug use: No  . Sexual activity: No    Birth control/protection: None  Other Topics Concern  . Not on file  Social History Narrative  . Not on file    Outpatient Medications Prior to Visit  Medication Sig Dispense Refill  . acetaminophen (TYLENOL) 325 MG tablet Take 650 mg by mouth every 6 (six) hours as needed.    Marland Kitchen albuterol  (PROVENTIL HFA;VENTOLIN HFA) 108 (90 Base) MCG/ACT inhaler Inhale 2 puffs into the lungs every 6 (six) hours as needed for wheezing or shortness of breath. 1 Inhaler 5  . amitriptyline (ELAVIL) 25 MG tablet Take 1 tablet (25 mg total) by mouth at bedtime. 30 tablet 2  . Insulin Pen Needle (BD PEN NEEDLE NANO U/F) 32G X 4 MM MISC Use once a day with Saxenda 100 each 1  . Liraglutide -Weight Management (SAXENDA) 18 MG/3ML SOPN Inject 3 mg into the skin daily. 3 mL 5  . metroNIDAZOLE (FLAGYL) 500 MG tablet Take 1 tablet (500 mg total) by mouth 2 (two) times daily. 14 tablet 0  . omeprazole (PRILOSEC) 40 MG capsule Take 1 capsule (40 mg total) by mouth daily. 30 capsule 11  . rizatriptan (MAXALT-MLT) 10 MG disintegrating tablet Take 1 tablet (10 mg total) by mouth as needed for migraine. May repeat in 2 hours if needed 10 tablet 0  . famotidine (PEPCID) 20 MG tablet Take 1 tablet (20 mg total) by mouth daily. 30 tablet 0   No facility-administered medications prior to visit.     Allergies  Allergen  Reactions  . Dulera [Mometasone Furo-Formoterol Fum] Hives  . Amoxil [Amoxicillin] Itching    Review of Systems  Constitutional: Negative for chills, fever and malaise/fatigue.  HENT: Negative for congestion and hearing loss.   Eyes: Negative for discharge.  Respiratory: Negative for cough, sputum production and shortness of breath.   Cardiovascular: Negative for chest pain, palpitations and leg swelling.  Gastrointestinal: Positive for abdominal pain. Negative for blood in stool, constipation, diarrhea, heartburn, nausea and vomiting.  Genitourinary: Positive for frequency. Negative for dysuria, hematuria and urgency.  Musculoskeletal: Negative for back pain, falls and myalgias.  Skin: Negative for rash.  Neurological: Negative for dizziness, sensory change, loss of consciousness, weakness and headaches.  Endo/Heme/Allergies: Negative for environmental allergies. Does not bruise/bleed easily.    Psychiatric/Behavioral: Negative for depression and suicidal ideas. The patient is not nervous/anxious and does not have insomnia.        Objective:    Physical Exam  Constitutional: She is oriented to person, place, and time. She appears well-developed and well-nourished.  HENT:  Head: Normocephalic and atraumatic.  Eyes: Conjunctivae and EOM are normal.  Neck: Normal range of motion. Neck supple. No JVD present. Carotid bruit is not present. No thyromegaly present.  Cardiovascular: Normal rate, regular rhythm and normal heart sounds.  No murmur heard. Pulmonary/Chest: Effort normal and breath sounds normal. No respiratory distress. She has no wheezes. She has no rales. She exhibits no tenderness.  Abdominal: Soft. Bowel sounds are normal. She exhibits no distension and no mass. There is no tenderness. There is no rebound and no guarding.  Genitourinary: There is no rash, tenderness, lesion or injury on the right labia. There is no rash, tenderness, lesion or injury on the left labia. Cervix exhibits discharge. Cervix exhibits no motion tenderness and no friability. No erythema, tenderness or bleeding in the vagina. No foreign body in the vagina. No signs of injury around the vagina. Vaginal discharge found.  Genitourinary Comments: + thin white d/c] genprobe done No odor  Musculoskeletal: She exhibits no edema.  Neurological: She is alert and oriented to person, place, and time.  Psychiatric: She has a normal mood and affect.  Nursing note and vitals reviewed.   BP (!) 133/98   Pulse 84   Temp 98.2 F (36.8 C) (Oral)   Resp 16   Ht 5' 4.96" (1.65 m)   Wt 235 lb 9.6 oz (106.9 kg)   SpO2 100%   BMI 39.25 kg/m  Wt Readings from Last 3 Encounters:  09/19/17 235 lb 9.6 oz (106.9 kg)  02/06/17 232 lb 4 oz (105.3 kg)  10/13/16 222 lb 3.2 oz (100.8 kg)   BP Readings from Last 3 Encounters:  09/19/17 (!) 133/98  02/06/17 110/72  10/13/16 102/80      There is no  immunization history on file for this patient.  Health Maintenance  Topic Date Due  . TETANUS/TDAP  05/13/2006  . PAP SMEAR  10/06/2015  . INFLUENZA VACCINE  02/08/2017  . HIV Screening  Completed    Lab Results  Component Value Date   WBC 5.1 08/25/2015   HGB 11.6 (L) 08/25/2015   HCT 35.8 (L) 08/25/2015   PLT 410.0 (H) 08/25/2015   GLUCOSE 91 08/25/2015   CHOL 200 08/25/2015   TRIG 118.0 08/25/2015   HDL 72.00 08/25/2015   LDLCALC 105 (H) 08/25/2015   ALT 12 08/25/2015   AST 16 08/25/2015   NA 139 08/25/2015   K 3.8 08/25/2015   CL 105 08/25/2015  CREATININE 0.91 08/25/2015   BUN 9 08/25/2015   CO2 29 08/25/2015   TSH 1.72 08/25/2015   MICROALBUR 7.0 (H) 08/25/2015    Lab Results  Component Value Date   TSH 1.72 08/25/2015   Lab Results  Component Value Date   WBC 5.1 08/25/2015   HGB 11.6 (L) 08/25/2015   HCT 35.8 (L) 08/25/2015   MCV 77.9 (L) 08/25/2015   PLT 410.0 (H) 08/25/2015   Lab Results  Component Value Date   NA 139 08/25/2015   K 3.8 08/25/2015   CO2 29 08/25/2015   GLUCOSE 91 08/25/2015   BUN 9 08/25/2015   CREATININE 0.91 08/25/2015   BILITOT 0.2 08/25/2015   ALKPHOS 64 08/25/2015   AST 16 08/25/2015   ALT 12 08/25/2015   PROT 7.5 08/25/2015   ALBUMIN 4.0 08/25/2015   CALCIUM 9.4 08/25/2015   GFR 94.47 08/25/2015   Lab Results  Component Value Date   CHOL 200 08/25/2015   Lab Results  Component Value Date   HDL 72.00 08/25/2015   Lab Results  Component Value Date   LDLCALC 105 (H) 08/25/2015   Lab Results  Component Value Date   TRIG 118.0 08/25/2015   Lab Results  Component Value Date   CHOLHDL 3 08/25/2015   No results found for: HGBA1C       Assessment & Plan:   Problem List Items Addressed This Visit    None    Visit Diagnoses    Vaginal discharge    -  Primary   Relevant Medications   famotidine (PEPCID) 20 MG tablet   metroNIDAZOLE (FLAGYL) 500 MG tablet   Other Relevant Orders   POCT Urinalysis  Dipstick (Automated) (Completed)   POCT urine pregnancy (Completed)   Cervicovaginal ancillary only   Gastroesophageal reflux disease, esophagitis presence not specified       Relevant Medications   famotidine (PEPCID) 20 MG tablet      I am having Sherry Villegas start on metroNIDAZOLE. I am also having her maintain her amitriptyline, rizatriptan, albuterol, acetaminophen, metroNIDAZOLE, omeprazole, Liraglutide -Weight Management, Insulin Pen Needle, and famotidine.  Meds ordered this encounter  Medications  . famotidine (PEPCID) 20 MG tablet    Sig: Take 1 tablet (20 mg total) by mouth daily.    Dispense:  30 tablet    Refill:  0  . metroNIDAZOLE (FLAGYL) 500 MG tablet    Sig: Take 1 tablet (500 mg total) by mouth 2 (two) times daily.    Dispense:  14 tablet    Refill:  0    CMA served as scribe during this visit. History, Physical and Plan performed by medical provider. Documentation and orders reviewed and attested to.  Donato Schultz, DO

## 2017-09-19 NOTE — Patient Instructions (Signed)

## 2017-09-21 LAB — CERVICOVAGINAL ANCILLARY ONLY
Bacterial vaginitis: POSITIVE — AB
CANDIDA VAGINITIS: NEGATIVE
CHLAMYDIA, DNA PROBE: NEGATIVE
NEISSERIA GONORRHEA: NEGATIVE
TRICH (WINDOWPATH): NEGATIVE

## 2017-09-25 ENCOUNTER — Other Ambulatory Visit: Payer: Self-pay | Admitting: *Deleted

## 2017-09-25 ENCOUNTER — Encounter: Payer: Self-pay | Admitting: Family Medicine

## 2017-09-25 MED ORDER — FLUCONAZOLE 150 MG PO TABS: ORAL_TABLET | ORAL | 0 refills | Status: DC

## 2017-09-25 NOTE — Telephone Encounter (Signed)
rx sent in 

## 2017-09-25 NOTE — Telephone Encounter (Signed)
Diflucan 150 mg #2  1 po qd x 1, may repeat in 3 days prn  

## 2017-11-01 ENCOUNTER — Telehealth: Payer: Self-pay | Admitting: Family Medicine

## 2017-11-01 DIAGNOSIS — N898 Other specified noninflammatory disorders of vagina: Secondary | ICD-10-CM

## 2017-11-01 NOTE — Telephone Encounter (Signed)
Copied from CRM [id number]. Topic: Quick Communication - Rx Refill/Question >> Nov 01, 2017  8:33 AM Arlyss Gandyichardson, Parley Pidcock N, NT wrote: Medication: metroNIDAZOLE (FLAGYL) 500 MG tablet Has the patient contacted their pharmacy? Yes.   (Agent: If no, request that the patient contact the pharmacy for the refill.) Preferred Pharmacy (with phone number or street name): [facility] - [place], [place] - [address] [phone] (Phone) [phone] (Fax)   Pt would like to see if this can be refilled for bacterial vaginosis. She is unable to take any days off from work at this time and would not be able to come in for an appointment until around 6:15pm  Agent: Please be advised that RX refills may take up to 3 business days. We ask that you follow-up with your pharmacy.

## 2017-11-01 NOTE — Telephone Encounter (Signed)
Pt would like to see if this can be refilled for bacterial vaginosis. She is unable to take any days off from work at this time and would not be able to come in for an appointment until around 6:15pm  Metronidazole 500 mg refill Last OV: 09/19/17 Last Refill:09/19/17  [facility] [place], [place] - [address] [phone] (Phone) [phone] (Fax)

## 2017-11-02 ENCOUNTER — Other Ambulatory Visit: Payer: Self-pay | Admitting: Family Medicine

## 2017-11-02 ENCOUNTER — Ambulatory Visit: Payer: BLUE CROSS/BLUE SHIELD | Admitting: Family Medicine

## 2017-11-02 DIAGNOSIS — N898 Other specified noninflammatory disorders of vagina: Secondary | ICD-10-CM

## 2017-11-02 NOTE — Telephone Encounter (Signed)
Do you want to refill she last had a fill 09/19/17?

## 2017-11-02 NOTE — Telephone Encounter (Signed)
She would have to come in to at least give urine sample for ancillary testing

## 2017-11-07 NOTE — Telephone Encounter (Signed)
Patient will be in tomorrow before work

## 2017-11-07 NOTE — Telephone Encounter (Signed)
Left message on machine to call back  

## 2017-11-08 ENCOUNTER — Other Ambulatory Visit: Payer: 59

## 2017-11-08 ENCOUNTER — Other Ambulatory Visit (HOSPITAL_COMMUNITY)
Admission: RE | Admit: 2017-11-08 | Discharge: 2017-11-08 | Disposition: A | Discharge status: Home / Self Care | Payer: 59 | Source: Ambulatory Visit | Attending: Family Medicine | Admitting: Family Medicine

## 2017-11-08 DIAGNOSIS — N898 Other specified noninflammatory disorders of vagina: Secondary | ICD-10-CM | POA: Diagnosis present

## 2017-11-11 LAB — URINE CYTOLOGY ANCILLARY ONLY: Candida vaginitis: NEGATIVE

## 2017-11-13 ENCOUNTER — Other Ambulatory Visit: Payer: Self-pay | Admitting: Family Medicine

## 2017-11-13 DIAGNOSIS — B9689 Other specified bacterial agents as the cause of diseases classified elsewhere: Secondary | ICD-10-CM

## 2017-11-13 DIAGNOSIS — N760 Acute vaginitis: Principal | ICD-10-CM

## 2017-11-13 MED ORDER — METRONIDAZOLE 500 MG PO TABS: 500.0000 mg | ORAL_TABLET | Freq: Two times a day (BID) | ORAL | 0 refills | Status: DC

## 2017-11-14 ENCOUNTER — Other Ambulatory Visit: Payer: Self-pay

## 2017-11-14 NOTE — Progress Notes (Signed)
Flagyl sent in for patient to pharmacy.

## 2018-04-23 ENCOUNTER — Ambulatory Visit: Payer: Self-pay | Admitting: *Deleted

## 2018-04-23 NOTE — Telephone Encounter (Signed)
Pt reports H/O abdominal pain when ovulating. Pain presently "Where ovaries are, I know I'm ovulating" ,onset Friday.States ongoing for several years, pain and duration varies, always when ovulating. Denies fever, vaginal discharge. States had ovarian cyst "Many years ago."  Presently with intermittent pain, both sides,8/10 when occurs. Reports "Not much pain yesterday, today the tylenol doesn't help". States she has not been seen regarding symptoms because " I'm not having pain when I'm at the doctors." States last cycle 2 weeks ago, "Lasted longer this time, spotting few days," not presently. Reports Dr. Zola Button did last PAP but NT did not see that appt noted. Does not have OB/GYN. Offered appt tomorrow, unable to make it to appt until Friday. Appt made with Dr. Zola Button for Friday AM. Crea advise given per protocol. Instructed pt to go to ED if pain worsens, fever or vaginal discharge, bleeding occur.   Reason for Disposition . [1] MODERATE (e.g., interferes with normal activities) pelvic pain AND [2] pain comes and goes (cramps) AND [3] present > 24 hours  Answer Assessment - Initial Assessment Questions 1. LOCATION: "Where does it hurt?"      Both sides "where ovaries are. " 2. RADIATION: "Does the pain shoot anywhere else?" (e.g., lower back, groin, thighs)     Shoots thru buttocks at times. Not every month. 3. ONSET: "When did the pain begin?" (e.g., minutes, hours or days ago)      3 days ago 4. SUDDEN: "Gradual or sudden onset?"     gradual 5. PATTERN "Does the pain come and go, or is it constant?"    - If constant: "Is it getting better, staying the same, or worsening?"      (Note: Constant means the pain never goes away completely; most serious pain is constant and gets worse over time)     - If intermittent: "How long does it last?" "Do you have pain now?"     (Note: Intermittent means the pain goes away completely between bouts)     Intermittent at beginning; now "Pretty much  constant." 6. SEVERITY: "How bad is the pain?"  (e.g., Scale 1-10; mild, moderate, or severe)   - MILD (1-3): doesn't interfere with normal activities, area soft and not tender to touch    - MODERATE (4-7): interferes with normal activities or awakens from sleep, tender to touch    - SEVERE (8-10): excruciating pain, doubled over, unable to do any normal activities      8/10 at times, mild intermittent pain yesterday, varies, "Sometimes just goes away." 7. RECURRENT SYMPTOM: "Have you ever had this type of pelvic pain before?" If so, ask: "When was the last time?" and "What happened that time?"      Yes, 2 years ago or longer 8. CAUSE: "What do you think is causing the pelvic pain?"     When I ovulate. 9. RELIEVING/AGGRAVATING FACTORS: "What makes it better or worse?" (e.g., activity/rest, sexual intercourse, voiding, passing stool)     Tylenol ineffective, rest helps 10. OTHER SYMPTOMS: "Has there been any other symptoms?" (e.g., fever, vaginal bleeding, vaginal discharge, diarrhea, constipation, or voiding problems?"       2 weeks ago has menstrual cycle, lasted longer with spotting.States "Probably not pregnant, we/ve been trying for years."  Protocols used: PELVIC PAIN - [facility]

## 2018-04-27 ENCOUNTER — Ambulatory Visit: Payer: 59 | Admitting: Family Medicine

## 2018-08-10 ENCOUNTER — Ambulatory Visit: Payer: Self-pay

## 2018-08-18 DIAGNOSIS — N760 Acute vaginitis: Secondary | ICD-10-CM | POA: Diagnosis not present

## 2018-08-18 DIAGNOSIS — B9689 Other specified bacterial agents as the cause of diseases classified elsewhere: Secondary | ICD-10-CM | POA: Diagnosis not present

## 2018-08-18 DIAGNOSIS — Z792 Long term (current) use of antibiotics: Secondary | ICD-10-CM | POA: Diagnosis not present

## 2018-08-26 DIAGNOSIS — J111 Influenza due to unidentified influenza virus with other respiratory manifestations: Secondary | ICD-10-CM | POA: Diagnosis not present

## 2018-10-19 DIAGNOSIS — R1314 Dysphagia, pharyngoesophageal phase: Secondary | ICD-10-CM | POA: Diagnosis not present

## 2018-10-19 DIAGNOSIS — K219 Gastro-esophageal reflux disease without esophagitis: Secondary | ICD-10-CM | POA: Diagnosis not present

## 2018-10-19 DIAGNOSIS — R1013 Epigastric pain: Secondary | ICD-10-CM | POA: Diagnosis not present

## 2018-10-23 DIAGNOSIS — R1314 Dysphagia, pharyngoesophageal phase: Secondary | ICD-10-CM | POA: Diagnosis not present

## 2018-10-23 DIAGNOSIS — K259 Gastric ulcer, unspecified as acute or chronic, without hemorrhage or perforation: Secondary | ICD-10-CM | POA: Diagnosis not present

## 2018-10-23 DIAGNOSIS — B9681 Helicobacter pylori [H. pylori] as the cause of diseases classified elsewhere: Secondary | ICD-10-CM | POA: Diagnosis not present

## 2018-10-23 DIAGNOSIS — K2950 Unspecified chronic gastritis without bleeding: Secondary | ICD-10-CM | POA: Diagnosis not present

## 2018-10-23 DIAGNOSIS — K2930 Chronic superficial gastritis without bleeding: Secondary | ICD-10-CM | POA: Diagnosis not present

## 2019-01-01 DIAGNOSIS — K2930 Chronic superficial gastritis without bleeding: Secondary | ICD-10-CM | POA: Diagnosis not present

## 2019-01-01 DIAGNOSIS — K2950 Unspecified chronic gastritis without bleeding: Secondary | ICD-10-CM | POA: Diagnosis not present

## 2019-01-01 DIAGNOSIS — B9681 Helicobacter pylori [H. pylori] as the cause of diseases classified elsewhere: Secondary | ICD-10-CM | POA: Diagnosis not present

## 2019-02-27 ENCOUNTER — Other Ambulatory Visit: Payer: Self-pay

## 2019-02-27 ENCOUNTER — Encounter: Payer: Self-pay | Admitting: Family Medicine

## 2019-02-27 ENCOUNTER — Ambulatory Visit (INDEPENDENT_AMBULATORY_CARE_PROVIDER_SITE_OTHER): Payer: BC Managed Care – PPO | Admitting: Family Medicine

## 2019-02-27 DIAGNOSIS — I10 Essential (primary) hypertension: Secondary | ICD-10-CM | POA: Insufficient documentation

## 2019-02-27 DIAGNOSIS — G43119 Migraine with aura, intractable, without status migrainosus: Secondary | ICD-10-CM | POA: Diagnosis not present

## 2019-02-27 MED ORDER — METOPROLOL SUCCINATE ER 25 MG PO TB24: 25.0000 mg | ORAL_TABLET | Freq: Every day | ORAL | 3 refills | Status: DC

## 2019-02-27 MED ORDER — AMITRIPTYLINE HCL 25 MG PO TABS: 25.0000 mg | ORAL_TABLET | Freq: Every day | ORAL | 2 refills | Status: DC

## 2019-02-27 MED ORDER — RIZATRIPTAN BENZOATE 10 MG PO TBDP: 10.0000 mg | ORAL_TABLET | ORAL | 0 refills | Status: DC | PRN

## 2019-02-27 NOTE — Progress Notes (Signed)
Virtual Visit via Video Note  I connected with Sherry Villegas on 02/27/19 at 10:20 AM EDT by a video enabled telemedicine application and verified that I am speaking with the correct person using two identifiers.  Location: Patient: home  Provider: home    I discussed the limitations of evaluation and management by telemedicine and the availability of in person appointments. The patient expressed understanding and agreed to proceed.  History of Present Illness: Pt is home c/o migraines-- she ran out of her regular medication and was taking advil / tylenol but then was dx h pylori and has to stay off advil   Past Medical History:  Diagnosis Date  . Asthma   . Migraine    Current Outpatient Medications on File Prior to Visit  Medication Sig Dispense Refill  . acetaminophen (TYLENOL) 325 MG tablet Take 650 mg by mouth every 6 (six) hours as needed.    Marland Kitchen albuterol (PROVENTIL HFA;VENTOLIN HFA) 108 (90 Base) MCG/ACT inhaler Inhale 2 puffs into the lungs every 6 (six) hours as needed for wheezing or shortness of breath. 1 Inhaler 5  . famotidine (PEPCID) 20 MG tablet Take 1 tablet (20 mg total) by mouth daily. 30 tablet 0  . fluconazole (DIFLUCAN) 150 MG tablet Take 1 tablet now as one dose, may repeat in 3 days if needed 2 tablet 0  . Insulin Pen Needle (BD PEN NEEDLE NANO U/F) 32G X 4 MM MISC Use once a day with Saxenda 100 each 1  . Liraglutide -Weight Management (SAXENDA) 18 MG/3ML SOPN Inject 3 mg into the skin daily. 3 mL 5  . metroNIDAZOLE (FLAGYL) 500 MG tablet Take 1 tablet (500 mg total) by mouth 2 (two) times daily. 14 tablet 0  . metroNIDAZOLE (FLAGYL) 500 MG tablet Take 1 tablet (500 mg total) by mouth 2 (two) times daily. 14 tablet 0  . omeprazole (PRILOSEC) 40 MG capsule Take 1 capsule (40 mg total) by mouth daily. 30 capsule 11   No current facility-administered medications on file prior to visit.     Observations/Objective: 177/108  p 89   248 lbs  Repeat bp  L arm  162/95 Pt in NAD   Assessment and Plan: 1. Intractable migraine with aura without status migrainosus Refill meds Headache may be from bp as well  - amitriptyline (ELAVIL) 25 MG tablet; Take 1 tablet (25 mg total) by mouth at bedtime.  Dispense: 30 tablet; Refill: 2 - rizatriptan (MAXALT-MLT) 10 MG disintegrating tablet; Take 1 tablet (10 mg total) by mouth as needed for migraine. May repeat in 2 hours if needed  Dispense: 10 tablet; Refill: 0  2. Essential hypertension Start metoprolol --- may help headaches as well F/u in office in 2 week s or sooner prn  - metoprolol succinate (TOPROL-XL) 25 MG 24 hr tablet; Take 1 tablet (25 mg total) by mouth daily.  Dispense: 90 tablet; Refill: 3   Follow Up Instructions:    I discussed the assessment and treatment plan with the patient. The patient was provided an opportunity to ask questions and all were answered. The patient agreed with the plan and demonstrated an understanding of the instructions.   The patient was advised to call back or seek an in-person evaluation if the symptoms worsen or if the condition fails to improve as anticipated.  I provided 15 minutes of non-face-to-face time during this encounter.   Ann Held, DO

## 2019-03-08 ENCOUNTER — Telehealth: Payer: Self-pay

## 2019-03-08 NOTE — Telephone Encounter (Signed)
PA initiated via Covermymeds; KEY: [id number]. PA approved.  PA Case: [id number], Status: Approved, Coverage Starts on: 03/08/2019 12:00:00 AM, Coverage Ends on: 03/07/2020 12:00:00 AM.

## 2019-03-15 ENCOUNTER — Ambulatory Visit: Payer: BC Managed Care – PPO | Admitting: Family Medicine

## 2019-03-22 ENCOUNTER — Ambulatory Visit (INDEPENDENT_AMBULATORY_CARE_PROVIDER_SITE_OTHER): Payer: BC Managed Care – PPO | Admitting: Family Medicine

## 2019-03-22 ENCOUNTER — Other Ambulatory Visit: Payer: Self-pay

## 2019-03-22 DIAGNOSIS — G43809 Other migraine, not intractable, without status migrainosus: Secondary | ICD-10-CM

## 2019-03-22 NOTE — Progress Notes (Signed)
Virtual Visit via Video Note  I connected with Sherry Villegas on 03/22/19 at  8:00 AM EDT by a video enabled telemedicine application and verified that I am speaking with the correct person using two identifiers.  Location: Patient:  Provider:    I discussed the limitations of evaluation and management by telemedicine and the availability of in person appointments. The patient expressed understanding and agreed to proceed.  History of Present Illness: Pt did not answer text   Observations/Objective:   Assessment and Plan:   Follow Up Instructions:    I discussed the assessment and treatment plan with the patient. The patient was provided an opportunity to ask questions and all were answered. The patient agreed with the plan and demonstrated an understanding of the instructions.   The patient was advised to call back or seek an in-person evaluation if the symptoms worsen or if the condition fails to improve as anticipated.  I provided 0 minutes of non-face-to-face time during this encounter.   Ann Held, DO

## 2019-04-22 ENCOUNTER — Encounter: Payer: Self-pay | Admitting: Family Medicine

## 2019-04-22 NOTE — Telephone Encounter (Signed)
No need for aspirin yet

## 2019-05-10 ENCOUNTER — Ambulatory Visit (INDEPENDENT_AMBULATORY_CARE_PROVIDER_SITE_OTHER): Payer: BC Managed Care – PPO | Admitting: Family Medicine

## 2019-05-10 ENCOUNTER — Encounter: Payer: Self-pay | Admitting: Family Medicine

## 2019-05-10 ENCOUNTER — Other Ambulatory Visit: Payer: Self-pay

## 2019-05-10 ENCOUNTER — Other Ambulatory Visit (HOSPITAL_COMMUNITY)
Admission: RE | Admit: 2019-05-10 | Discharge: 2019-05-10 | Disposition: A | Discharge status: Home / Self Care | Payer: BC Managed Care – PPO | Source: Ambulatory Visit | Attending: Family Medicine | Admitting: Family Medicine

## 2019-05-10 VITALS — BP 122/90 | HR 81 | Temp 97.8°F | Resp 18 | Ht 64.5 in | Wt 238.4 lb

## 2019-05-10 DIAGNOSIS — I10 Essential (primary) hypertension: Secondary | ICD-10-CM | POA: Diagnosis not present

## 2019-05-10 DIAGNOSIS — Z8742 Personal history of other diseases of the female genital tract: Secondary | ICD-10-CM | POA: Diagnosis not present

## 2019-05-10 DIAGNOSIS — A6000 Herpesviral infection of urogenital system, unspecified: Secondary | ICD-10-CM | POA: Diagnosis not present

## 2019-05-10 DIAGNOSIS — G43119 Migraine with aura, intractable, without status migrainosus: Secondary | ICD-10-CM | POA: Diagnosis not present

## 2019-05-10 DIAGNOSIS — Z0000 Encounter for general adult medical examination without abnormal findings: Secondary | ICD-10-CM

## 2019-05-10 DIAGNOSIS — Z23 Encounter for immunization: Secondary | ICD-10-CM

## 2019-05-10 DIAGNOSIS — J449 Chronic obstructive pulmonary disease, unspecified: Secondary | ICD-10-CM | POA: Diagnosis not present

## 2019-05-10 LAB — COMPREHENSIVE METABOLIC PANEL
ALT: 12 U/L (ref 0–35)
AST: 18 U/L (ref 0–37)
Albumin: 4.2 g/dL (ref 3.5–5.2)
Alkaline Phosphatase: 87 U/L (ref 39–117)
BUN: 10 mg/dL (ref 6–23)
CO2: 27 mEq/L (ref 19–32)
Calcium: 9.7 mg/dL (ref 8.4–10.5)
Chloride: 103 mEq/L (ref 96–112)
Creatinine, Ser: 0.96 mg/dL (ref 0.40–1.20)
GFR: 81.5 mL/min (ref >60.00)
Glucose, Bld: 86 mg/dL (ref 70–99)
Potassium: 4.1 mEq/L (ref 3.5–5.1)
Sodium: 139 mEq/L (ref 135–145)
Total Bilirubin: 0.4 mg/dL (ref 0.2–1.2)
Total Protein: 7.5 g/dL (ref 6.0–8.3)

## 2019-05-10 LAB — LIPID PANEL
Cholesterol: 216 mg/dL — ABNORMAL HIGH (ref 0–200)
HDL: 70.3 mg/dL (ref >39.00)
LDL Cholesterol: 124 mg/dL — ABNORMAL HIGH (ref 0–99)
NonHDL: 145.88
Total CHOL/HDL Ratio: 3
Triglycerides: 109 mg/dL (ref 0.0–149.0)
VLDL: 21.8 mg/dL (ref 0.0–40.0)

## 2019-05-10 LAB — CBC WITH DIFFERENTIAL/PLATELET
Basophils Absolute: 0 10*3/uL (ref 0.0–0.1)
Basophils Relative: 0.4 % (ref 0.0–3.0)
Eosinophils Absolute: 0.1 10*3/uL (ref 0.0–0.7)
Eosinophils Relative: 2.4 % (ref 0.0–5.0)
HCT: 34.1 % — ABNORMAL LOW (ref 36.0–46.0)
Hemoglobin: 10.6 g/dL — ABNORMAL LOW (ref 12.0–15.0)
Lymphocytes Relative: 34.8 % (ref 12.0–46.0)
Lymphs Abs: 1.7 10*3/uL (ref 0.7–4.0)
MCHC: 31.1 g/dL (ref 30.0–36.0)
MCV: 76.9 fl — ABNORMAL LOW (ref 78.0–100.0)
Monocytes Absolute: 0.3 10*3/uL (ref 0.1–1.0)
Monocytes Relative: 6.3 % (ref 3.0–12.0)
Neutro Abs: 2.8 10*3/uL (ref 1.4–7.7)
Neutrophils Relative %: 56.1 % (ref 43.0–77.0)
Platelets: 441 10*3/uL — ABNORMAL HIGH (ref 150.0–400.0)
RBC: 4.43 Mil/uL (ref 3.87–5.11)
RDW: 16.8 % — ABNORMAL HIGH (ref 11.5–15.5)
WBC: 5 10*3/uL (ref 4.0–10.5)

## 2019-05-10 LAB — TSH: TSH: 0.92 u[IU]/mL (ref 0.35–4.50)

## 2019-05-10 MED ORDER — ALBUTEROL SULFATE HFA 108 (90 BASE) MCG/ACT IN AERS: 2.0000 | INHALATION_SPRAY | Freq: Four times a day (QID) | RESPIRATORY_TRACT | 2 refills | Status: DC | PRN

## 2019-05-10 MED ORDER — RIZATRIPTAN BENZOATE 10 MG PO TBDP: 10.0000 mg | ORAL_TABLET | ORAL | 0 refills | Status: DC | PRN

## 2019-05-10 NOTE — Progress Notes (Signed)
Subjective:     Sherry Villegas is a 33 y.o. female and is here for a comprehensive physical exam. The patient reports heavy periods  She is also here for f/u bp and refills   Social History   Socioeconomic History  . Marital status: Single    Spouse name: Not on file  . Number of children: Not on file  . Years of education: Not on file  . Highest education level: Not on file  Occupational History  . Occupation: Clinical biochemist rep   Social Needs  . Financial resource strain: Not on file  . Food insecurity    Worry: Not on file    Inability: Not on file  . Transportation needs    Medical: Not on file    Non-medical: Not on file  Tobacco Use  . Smoking status: Former Smoker    Packs/day: 0.25    Years: 9.00    Pack years: 2.25    Types: Cigarettes    Quit date: 02/14/2016    Years since quitting: 3.2  . Smokeless tobacco: Never Used  Substance and Sexual Activity  . Alcohol use: Yes    Alcohol/week: 0.0 standard drinks  . Drug use: No  . Sexual activity: Never    Birth control/protection: None  Lifestyle  . Physical activity    Days per week: Not on file    Minutes per session: Not on file  . Stress: Not on file  Relationships  . Social Musician on phone: Not on file    Gets together: Not on file    Attends religious service: Not on file    Active member of club or organization: Not on file    Attends meetings of clubs or organizations: Not on file    Relationship status: Not on file  . Intimate partner violence    Fear of current or ex partner: Not on file    Emotionally abused: Not on file    Physically abused: Not on file    Forced sexual activity: Not on file  Other Topics Concern  . Not on file  Social History Narrative  . Not on file   Health Maintenance  Topic Date Due  . TETANUS/TDAP  05/13/2006  . PAP SMEAR-Modifier  10/06/2015  . INFLUENZA VACCINE  02/09/2019  . HIV Screening  Completed    The following portions of the patient's  history were reviewed and updated as appropriate:  She  has a past medical history of Asthma and Migraine. She does not have any pertinent problems on file. She  has no past surgical history on file. Her family history includes Breast cancer in her maternal aunt, maternal grandmother, and paternal grandmother; Heart disease (age of onset: 46) in her mother; Hypertension in her father; Stroke in her father and mother. She  reports that she quit smoking about 3 years ago. Her smoking use included cigarettes. She has a 2.25 pack-year smoking history. She has never used smokeless tobacco. She reports current alcohol use of about 6.0 standard drinks of alcohol per week. She reports current drug use. Drug: Marijuana. She has a current medication list which includes the following prescription(s): acetaminophen, albuterol, amitriptyline, famotidine, insulin pen needle, liraglutide -weight management, metoprolol succinate, omeprazole, and rizatriptan. Current Outpatient Medications on File Prior to Visit  Medication Sig Dispense Refill  . acetaminophen (TYLENOL) 325 MG tablet Take 650 mg by mouth every 6 (six) hours as needed.    Marland Kitchen amitriptyline (ELAVIL) 25 MG  tablet Take 1 tablet (25 mg total) by mouth at bedtime. 30 tablet 2  . famotidine (PEPCID) 20 MG tablet Take 1 tablet (20 mg total) by mouth daily. 30 tablet 0  . Insulin Pen Needle (BD PEN NEEDLE NANO U/F) 32G X 4 MM MISC Use once a day with Saxenda 100 each 1  . Liraglutide -Weight Management (SAXENDA) 18 MG/3ML SOPN Inject 3 mg into the skin daily. 3 mL 5  . metoprolol succinate (TOPROL-XL) 25 MG 24 hr tablet Take 1 tablet (25 mg total) by mouth daily. 90 tablet 3  . omeprazole (PRILOSEC) 40 MG capsule Take 1 capsule (40 mg total) by mouth daily. 30 capsule 11   No current facility-administered medications on file prior to visit.    She is allergic to dulera [mometasone furo-formoterol fum] and amoxil [amoxicillin]..  Review of Systems Review  of Systems  Constitutional: Negative for activity change, appetite change and fatigue.  HENT: Negative for hearing loss, congestion, tinnitus and ear discharge.  dentist [id number] Eyes: Negative for visual disturbance (see optho q1y -- vision corrected to 20/20 with glasses).  Respiratory: Negative for cough, chest tightness and shortness of breath.   Cardiovascular: Negative for chest pain, palpitations and leg swelling.  Gastrointestinal: Negative for abdominal pain, diarrhea, constipation and abdominal distention.  Genitourinary: Negative for urgency, frequency, decreased urine volume and difficulty urinating.  Musculoskeletal: Negative for back pain, arthralgias and gait problem.  Skin: Negative for color change, pallor and rash.  Neurological: Negative for dizziness, light-headedness, numbness and headaches.  Hematological: Negative for adenopathy. Does not bruise/bleed easily.  Psychiatric/Behavioral: Negative for suicidal ideas, confusion, sleep disturbance, self-injury, dysphoric mood, decreased concentration and agitation.       Objective:    BP 122/90 (BP Location: Right Arm, Patient Position: Sitting, Cuff Size: Normal)   Pulse 81   Temp 97.8 F (36.6 C) (Temporal)   Resp 18   Ht 5' 4.5" (1.638 m)   Wt 238 lb 6.4 oz (108.1 kg)   SpO2 98%   BMI 40.29 kg/m  General appearance: alert, cooperative, appears stated age and no distress Head: Normocephalic, without obvious abnormality, atraumatic Eyes: negative findings: lids and lashes normal, conjunctivae and sclerae normal and pupils equal, round, reactive to light and accomodation Ears: normal TM's and external ear canals both ears Nose: Nares normal. Septum midline. Mucosa normal. No drainage or sinus tenderness. Throat: lips, mucosa, and tongue normal; teeth and gums normal Neck: no adenopathy, no carotid bruit, no JVD, supple, symmetrical, trachea midline and thyroid not enlarged, symmetric, no tenderness/mass/nodules Back:  symmetric, no curvature. ROM normal. No CVA tenderness. Lungs: clear to auscultation bilaterally Breasts: normal appearance, no masses or tenderness Heart: regular rate and rhythm, S1, S2 normal, no murmur, click, rub or gallop Abdomen: soft, non-tender; bowel sounds normal; no masses,  no organomegaly Pelvic: cervix normal in appearance, external genitalia normal, no adnexal masses or tenderness, no cervical motion tenderness, rectovaginal septum normal, uterus normal size, shape, and consistency, vagina normal without discharge and pap done Extremities: extremities normal, atraumatic, no cyanosis or edema Pulses: 2+ and symmetric Skin: Skin color, texture, turgor normal. No rashes or lesions Lymph nodes: Cervical, supraclavicular, and axillary nodes normal. Neurologic: Alert and oriented X 3, normal strength and tone. Normal symmetric reflexes. Normal coordination and gait    Assessment:    Healthy female exam.      Plan:    ghm utd Check labs  See After Visit Summary for Counseling Recommendations    1. Preventative  health care See above  - Lipid panel - CBC with Differential/Platelet - TSH - Comprehensive metabolic panel - HIV Antibody (routine testing w rflx) - HSV Type I/II IgG, IgMw/ reflex - RPR - Hepatitis C antibody - Cytology - PAP( [facility])  2. Essential hypertension Well controlled, no changes to meds. Encouraged heart healthy diet such as the DASH diet and exercise as tolerated.  - Lipid panel - CBC with Differential/Platelet - TSH - Comprehensive metabolic panel  3. Need for Td vaccine  - Tdap vaccine greater than or equal to 7yo IM  4. History of heavy vaginal bleeding  - US Pelvic Complete With Transvaginal; Future - Ambulatory referral to Obstetrics / Gynecology  5. Intractable migraine with aura without status migrainosus Stable  Refill meds  - rizatriptan (MAXALT-MLT) 10 MG disintegrating tablet; Take 1 tablet (10 mg total) by mouth as  needed for migraine. May repeat in 2 hours if needed  Dispense: 10 tablet; Refill: 0

## 2019-05-10 NOTE — Patient Instructions (Signed)
Preventive Care 21-32 Years Old, Female Preventive care refers to visits with your health care provider and lifestyle choices that can promote health and wellness. This includes:  A yearly physical exam. This may also be called an annual well check.  Regular dental visits and eye exams.  Immunizations.  Screening for certain conditions.  Healthy lifestyle choices, such as eating a healthy diet, getting regular exercise, not using drugs or products that contain nicotine and tobacco, and limiting alcohol use. What can I expect for my preventive care visit? Physical exam Your health care provider will check your:  Height and weight. This may be used to calculate body mass index (BMI), which tells if you are at a healthy weight.  Heart rate and blood pressure.  Skin for abnormal spots. Counseling Your health care provider may ask you questions about your:  Alcohol, tobacco, and drug use.  Emotional well-being.  Home and relationship well-being.  Sexual activity.  Eating habits.  Work and work environment.  Method of birth control.  Menstrual cycle.  Pregnancy history. What immunizations do I need?  Influenza (flu) vaccine  This is recommended every year. Tetanus, diphtheria, and pertussis (Tdap) vaccine  You may need a Td booster every 10 years. Varicella (chickenpox) vaccine  You may need this if you have not been vaccinated. Human papillomavirus (HPV) vaccine  If recommended by your health care provider, you may need three doses over 6 months. Measles, mumps, and rubella (MMR) vaccine  You may need at least one dose of MMR. You may also need a second dose. Meningococcal conjugate (MenACWY) vaccine  One dose is recommended if you are age 19-21 years and a first-year college student living in a residence hall, or if you have one of several medical conditions. You may also need additional booster doses. Pneumococcal conjugate (PCV13) vaccine  You may need  this if you have certain conditions and were not previously vaccinated. Pneumococcal polysaccharide (PPSV23) vaccine  You may need one or two doses if you smoke cigarettes or if you have certain conditions. Hepatitis A vaccine  You may need this if you have certain conditions or if you travel or work in places where you may be exposed to hepatitis A. Hepatitis B vaccine  You may need this if you have certain conditions or if you travel or work in places where you may be exposed to hepatitis B. Haemophilus influenzae type b (Hib) vaccine  You may need this if you have certain conditions. You may receive vaccines as individual doses or as more than one vaccine together in one shot (combination vaccines). Talk with your health care provider about the risks and benefits of combination vaccines. What tests do I need?  Blood tests  Lipid and cholesterol levels. These may be checked every 5 years starting at age 20.  Hepatitis C test.  Hepatitis B test. Screening  Diabetes screening. This is done by checking your blood sugar (glucose) after you have not eaten for a while (fasting).  Sexually transmitted disease (STD) testing.  BRCA-related cancer screening. This may be done if you have a family history of breast, ovarian, tubal, or peritoneal cancers.  Pelvic exam and Pap test. This may be done every 3 years starting at age 21. Starting at age 30, this may be done every 5 years if you have a Pap test in combination with an HPV test. Talk with your health care provider about your test results, treatment options, and if necessary, the need for more tests.   Follow these instructions at home: Eating and drinking   Eat a diet that includes fresh fruits and vegetables, whole grains, lean protein, and low-fat dairy.  Take vitamin and mineral supplements as recommended by your health care provider.  Do not drink alcohol if: ? Your health care provider tells you not to drink. ? You are  pregnant, may be pregnant, or are planning to become pregnant.  If you drink alcohol: ? Limit how much you have to 0-1 drink a day. ? Be aware of how much alcohol is in your drink. In the [place] one drink equals one 12 oz bottle of beer (355 mL), one 5 oz glass of wine (148 mL), or one 1 oz glass of hard liquor (44 mL). Lifestyle  Take daily care of your teeth and gums.  Stay active. Exercise for at least 30 minutes on 5 or more days each week.  Do not use any products that contain nicotine or tobacco, such as cigarettes, e-cigarettes, and chewing tobacco. If you need help quitting, ask your health care provider.  If you are sexually active, practice safe sex. Use a condom or other form of birth control (contraception) in order to prevent pregnancy and STIs (sexually transmitted infections). If you plan to become pregnant, see your health care provider for a preconception visit. What's next?  Visit your health care provider once a year for a well check visit.  Ask your health care provider how often you should have your eyes and teeth checked.  Stay up to date on all vaccines. This information is not intended to replace advice given to you by your health care provider. Make sure you discuss any questions you have with your health care provider. Document Released: 08/23/2001 Document Revised: 03/08/2018 Document Reviewed: 03/08/2018 Elsevier Patient Education  2020 [employer]  

## 2019-05-12 ENCOUNTER — Encounter: Payer: Self-pay | Admitting: Family Medicine

## 2019-05-12 ENCOUNTER — Other Ambulatory Visit: Payer: Self-pay | Admitting: Family Medicine

## 2019-05-12 DIAGNOSIS — D509 Iron deficiency anemia, unspecified: Secondary | ICD-10-CM

## 2019-05-12 DIAGNOSIS — E785 Hyperlipidemia, unspecified: Secondary | ICD-10-CM

## 2019-05-12 NOTE — Assessment & Plan Note (Signed)
controlled 

## 2019-05-12 NOTE — Assessment & Plan Note (Signed)
Well controlled, no changes to meds. Encouraged heart healthy diet such as the DASH diet and exercise as tolerated.  °

## 2019-05-13 ENCOUNTER — Encounter: Payer: Self-pay | Admitting: Family Medicine

## 2019-05-13 DIAGNOSIS — A6000 Herpesviral infection of urogenital system, unspecified: Secondary | ICD-10-CM | POA: Insufficient documentation

## 2019-05-13 LAB — HIV ANTIBODY (ROUTINE TESTING W REFLEX): HIV 1&2 Ab, 4th Generation: NONREACTIVE

## 2019-05-13 LAB — HEPATITIS C ANTIBODY
Hepatitis C Ab: NONREACTIVE
SIGNAL TO CUT-OFF: 0.03 (ref <1.00)

## 2019-05-13 LAB — RPR: RPR Ser Ql: NONREACTIVE

## 2019-05-14 LAB — HSV TYPE I/II IGG, IGMW/ REFLEX
HSV 1 Glycoprotein G Ab, IgG: 1.88 index — ABNORMAL HIGH (ref 0.00–0.90)
HSV 1 IgM: <1:10 {titer}
HSV 2 IgG, Type Spec: 7 index — ABNORMAL HIGH (ref 0.00–0.90)
HSV 2 IgM: <1:10 {titer}

## 2019-05-15 ENCOUNTER — Other Ambulatory Visit: Payer: Self-pay

## 2019-05-15 MED ORDER — VALACYCLOVIR HCL 1 G PO TABS: 1000.0000 mg | ORAL_TABLET | Freq: Two times a day (BID) | ORAL | 2 refills | Status: DC

## 2019-05-21 LAB — CYTOLOGY - PAP
Chlamydia: NEGATIVE
Comment: NEGATIVE
Comment: NEGATIVE
Comment: NEGATIVE
Comment: NORMAL
Diagnosis: NEGATIVE
HSV1: NEGATIVE
HSV2: NEGATIVE
Neisseria Gonorrhea: NEGATIVE
Trichomonas: NEGATIVE

## 2019-06-20 ENCOUNTER — Encounter: Payer: BC Managed Care – PPO | Admitting: Obstetrics & Gynecology

## 2019-11-08 ENCOUNTER — Other Ambulatory Visit: Payer: Self-pay

## 2019-11-08 ENCOUNTER — Ambulatory Visit (HOSPITAL_BASED_OUTPATIENT_CLINIC_OR_DEPARTMENT_OTHER): Payer: BC Managed Care – PPO

## 2019-11-08 ENCOUNTER — Ambulatory Visit (HOSPITAL_BASED_OUTPATIENT_CLINIC_OR_DEPARTMENT_OTHER)
Admission: RE | Admit: 2019-11-08 | Discharge: 2019-11-08 | Disposition: A | Discharge status: Home / Self Care | Payer: BC Managed Care – PPO | Source: Ambulatory Visit | Attending: Family Medicine | Admitting: Family Medicine

## 2019-11-08 ENCOUNTER — Telehealth (INDEPENDENT_AMBULATORY_CARE_PROVIDER_SITE_OTHER): Payer: BC Managed Care – PPO | Admitting: Medical

## 2019-11-08 VITALS — BP 172/94 | HR 98

## 2019-11-08 DIAGNOSIS — J40 Bronchitis, not specified as acute or chronic: Secondary | ICD-10-CM

## 2019-11-08 DIAGNOSIS — R062 Wheezing: Secondary | ICD-10-CM

## 2019-11-08 DIAGNOSIS — Z8742 Personal history of other diseases of the female genital tract: Secondary | ICD-10-CM | POA: Diagnosis not present

## 2019-11-08 DIAGNOSIS — J301 Allergic rhinitis due to pollen: Secondary | ICD-10-CM | POA: Diagnosis not present

## 2019-11-08 DIAGNOSIS — N939 Abnormal uterine and vaginal bleeding, unspecified: Secondary | ICD-10-CM | POA: Diagnosis not present

## 2019-11-08 MED ORDER — HYDROCODONE-HOMATROPINE 5-1.5 MG/5ML PO SYRP: 5.0000 mL | ORAL_SOLUTION | Freq: Four times a day (QID) | ORAL | 0 refills | Status: DC | PRN

## 2019-11-08 MED ORDER — LEVOCETIRIZINE DIHYDROCHLORIDE 5 MG PO TABS: 5.0000 mg | ORAL_TABLET | Freq: Every evening | ORAL | 3 refills | Status: DC

## 2019-11-08 MED ORDER — AZITHROMYCIN 250 MG PO TABS: ORAL_TABLET | ORAL | 0 refills | Status: DC

## 2019-11-08 MED ORDER — METHYLPREDNISOLONE 4 MG PO TABS
ORAL_TABLET | ORAL | 0 refills | Status: DC
Start: 2019-11-08 — End: 2020-05-28

## 2019-11-08 MED ORDER — AZELASTINE HCL 0.1 % NA SOLN: 2.0000 | Freq: Two times a day (BID) | NASAL | 3 refills | Status: DC

## 2019-11-08 NOTE — Progress Notes (Signed)
Subjective:    Patient ID: Sherry Villegas, female    DOB: Aug 24, 1985, 34 y.o.   MRN: [id number]  HPI  Virtual Visit via Video Note  I connected with [name] on 11/08/19 at  9:20 AM EDT by a video enabled telemedicine application and verified that I am speaking with the correct person using two identifiers.  Location: Patient: home Provider: office.   I discussed the limitations of evaluation and management by telemedicine and the availability of in person appointments. The patient expressed understanding and agreed to proceed.   Follow Up Instructions:    I discussed the assessment and treatment plan with the patient. The patient was provided an opportunity to ask questions and all were answered. The patient agreed with the plan and demonstrated an understanding of the instructions.   The patient was advised to call back or seek an in-person evaluation if the symptoms worsen or if the condition fails to improve as anticipated.     Merrell Borsuk, PA-C   Hpi-  Pt states last week on day that someone cut her grass that day. That day she started to get sneezing, itchy, runny nose and developed cough eventually. Last 2 days cough has been a lot worse. Hardly can sleep due to cough.   Pt has hx of asthma also begiining to wheeze.  In the past she gets bronchitis easily.   Some productive cough.   Pt is not diabetic.   Review of Systems  Constitutional: Negative for chills, fatigue and fever.  HENT: Positive for congestion, postnasal drip, rhinorrhea and sneezing. Negative for sore throat.   Respiratory: Positive for cough and wheezing. Negative for shortness of breath.   Cardiovascular: Negative for chest pain and palpitations.  Gastrointestinal: Negative for abdominal pain.  Musculoskeletal: Negative for back pain and myalgias.  Neurological: Negative for dizziness and headaches.  Hematological: Negative for adenopathy. Does not bruise/bleed easily.    Psychiatric/Behavioral: Negative for behavioral problems and confusion.    Past Medical History:  Diagnosis Date  . Asthma   . Migraine      Social History   Socioeconomic History  . Marital status: Single    Spouse name: Not on file  . Number of children: Not on file  . Years of education: Not on file  . Highest education level: Not on file  Occupational History  . Occupation: customer service rep   Tobacco Use  . Smoking status: Former Smoker    Packs/day: 0.25    Years: 9.00    Pack years: 2.25    Types: Cigarettes    Quit date: 02/14/2016    Years since quitting: 3.7  . Smokeless tobacco: Never Used  Substance and Sexual Activity  . Alcohol use: Yes    Alcohol/week: 6.0 standard drinks    Types: 6 Shots of liquor per week  . Drug use: Yes    Types: Marijuana    Comment: special occassions only-- 1 x q47months  . Sexual activity: Yes    Partners: Male    Birth control/protection: None, Condom  Other Topics Concern  . Not on file  Social History Narrative  . Not on file   Social Determinants of Health   Financial Resource Strain:   . Difficulty of Paying Living Expenses:   Food Insecurity:   . Worried About Programme researcher, broadcasting/film/video in the Last Year:   . Barista in the Last Year:   Transportation Needs:   . Freight forwarder (Medical):   Marland Kitchen  Lack of Transportation (Non-Medical):   Physical Activity:   . Days of Exercise per Week:   . Minutes of Exercise per Session:   Stress:   . Feeling of Stress :   Social Connections:   . Frequency of Communication with Friends and Family:   . Frequency of Social Gatherings with Friends and Family:   . Attends Religious Services:   . Active Member of Clubs or Organizations:   . Attends Banker Meetings:   Marland Kitchen Marital Status:   Intimate Partner Violence:   . Fear of Current or Ex-Partner:   . Emotionally Abused:   Marland Kitchen Physically Abused:   . Sexually Abused:     No past surgical history on  file.  Family History  Problem Relation Age of Onset  . Heart disease Mother 24       MI  . Stroke Mother   . Stroke Father   . Hypertension Father   . Breast cancer Maternal Grandmother   . Breast cancer Maternal Aunt   . Breast cancer Paternal Grandmother     Allergies  Allergen Reactions  . Dulera [Mometasone Furo-Formoterol Fum] Hives  . Amoxil [Amoxicillin] Itching    Current Outpatient Medications on File Prior to Visit  Medication Sig Dispense Refill  . acetaminophen (TYLENOL) 325 MG tablet Take 650 mg by mouth every 6 (six) hours as needed.    Marland Kitchen albuterol (VENTOLIN HFA) 108 (90 Base) MCG/ACT inhaler Inhale 2 puffs into the lungs every 6 (six) hours as needed for wheezing or shortness of breath. 1 g 2  . amitriptyline (ELAVIL) 25 MG tablet Take 1 tablet (25 mg total) by mouth at bedtime. 30 tablet 2  . famotidine (PEPCID) 20 MG tablet Take 1 tablet (20 mg total) by mouth daily. 30 tablet 0  . Insulin Pen Needle (BD PEN NEEDLE NANO U/F) 32G X 4 MM MISC Use once a day with Saxenda 100 each 1  . Liraglutide -Weight Management (SAXENDA) 18 MG/3ML SOPN Inject 3 mg into the skin daily. 3 mL 5  . metoprolol succinate (TOPROL-XL) 25 MG 24 hr tablet Take 1 tablet (25 mg total) by mouth daily. 90 tablet 3  . omeprazole (PRILOSEC) 40 MG capsule Take 1 capsule (40 mg total) by mouth daily. 30 capsule 11  . rizatriptan (MAXALT-MLT) 10 MG disintegrating tablet Take 1 tablet (10 mg total) by mouth as needed for migraine. May repeat in 2 hours if needed 10 tablet 0  . valACYclovir (VALTREX) 1000 MG tablet Take 1 tablet (1,000 mg total) by mouth 2 (two) times daily. 60 tablet 2   No current facility-administered medications on file prior to visit.    There were no vitals taken for this visit.      Objective:   Physical Exam  General-no acute distress, pleasant, oriented. Lungs- on inspection lungs appear unlabored. Neck- no tracheal deviation or jvd on inspection. Neuro- gross  motor function appears intact.     Assessment & Plan:  You do have recent allergy type signs/symptoms with asthma flare and bronchitis.  Will rx xzyal anthistamine, astelin nasal spray, and hycodan cough med. Rx advisement.  Approaching weekend and will rx tapered medrol dose.  Also rx azithromycin.  If signs/symptoms worsen or change then advise ED evaluation.  If signs and symptoms change or don't improve then recommend covid testing.  Follow up 5-7 days virtual visit follow up or as needed.   Time spent with patient today was 30  minutes which consisted of  chart review, discussing diagnosis  Treatment plan and documentation. Esperanza Richters, PA-C

## 2019-11-08 NOTE — Patient Instructions (Addendum)
You do have recent allergy type signs/symptoms with asthma flare and bronchitis.  Will rx xzyal anthistamine, astelin nasal spray, and hycodan cough med. Rx advisement.  Approaching weekend and will rx tapered medrol dose. Use albuterol if needed.   Also rx azithromycin.  If signs/symptoms worsen or change then advise ED evaluation.  If signs and symptoms change or don't improve then recommend covid testing.  Follow up 5-7 days virtual visit follow up or as needed.

## 2019-11-11 ENCOUNTER — Telehealth: Payer: Self-pay

## 2019-11-11 ENCOUNTER — Encounter: Payer: Self-pay | Admitting: Family Medicine

## 2019-11-11 NOTE — Telephone Encounter (Signed)
Pt called to discuss [place] results. Pt made aware that a message will be sent to the provider. Understanding was voiced.  Zully Frane l Jsoeph Podesta, CMA

## 2019-11-11 NOTE — Telephone Encounter (Signed)
Returned pt's call regarding [place] results. Pt made aware that a cyst may have been seen on her left ovary or it may be where she ovulated. F/U US will be repeated in 6 weeks. Understanding was voiced.  Davan Nawabi l Ranessa Kosta, CMA

## 2019-11-11 NOTE — Telephone Encounter (Signed)
-----   Message from Reva Bores, MD sent at 11/11/2019  2:37 PM EDT ----- Regarding: RE: [place] results There is something seen on the left, which may be a cyst or just where she ovulated. We will follow-up with another ultrasound in 6 weeks. ----- Message ----- From: Mikey Bussing, CMA Sent: 11/11/2019   1:23 PM EDT To: [name], DO Subject: [place] results                                     Pt received [place] results on Mychart and wants to know if the results can be reviewed before her appointment scheduled for 11/29/19.

## 2019-11-12 ENCOUNTER — Other Ambulatory Visit: Payer: Self-pay

## 2019-11-12 DIAGNOSIS — Z8742 Personal history of other diseases of the female genital tract: Secondary | ICD-10-CM

## 2019-11-12 DIAGNOSIS — N858 Other specified noninflammatory disorders of uterus: Secondary | ICD-10-CM

## 2019-11-29 ENCOUNTER — Ambulatory Visit: Payer: BC Managed Care – PPO | Admitting: Family Medicine

## 2019-11-29 DIAGNOSIS — R109 Unspecified abdominal pain: Secondary | ICD-10-CM

## 2020-03-04 ENCOUNTER — Ambulatory Visit (HOSPITAL_BASED_OUTPATIENT_CLINIC_OR_DEPARTMENT_OTHER): Payer: BC Managed Care – PPO

## 2020-03-11 ENCOUNTER — Other Ambulatory Visit: Payer: Self-pay

## 2020-03-11 ENCOUNTER — Ambulatory Visit (HOSPITAL_BASED_OUTPATIENT_CLINIC_OR_DEPARTMENT_OTHER): Payer: BC Managed Care – PPO

## 2020-03-11 ENCOUNTER — Ambulatory Visit (INDEPENDENT_AMBULATORY_CARE_PROVIDER_SITE_OTHER): Payer: BC Managed Care – PPO

## 2020-03-11 DIAGNOSIS — Z8742 Personal history of other diseases of the female genital tract: Secondary | ICD-10-CM | POA: Diagnosis not present

## 2020-03-11 DIAGNOSIS — N858 Other specified noninflammatory disorders of uterus: Secondary | ICD-10-CM

## 2020-03-11 DIAGNOSIS — N939 Abnormal uterine and vaginal bleeding, unspecified: Secondary | ICD-10-CM | POA: Diagnosis not present

## 2020-03-11 DIAGNOSIS — N854 Malposition of uterus: Secondary | ICD-10-CM | POA: Diagnosis not present

## 2020-03-12 ENCOUNTER — Encounter: Payer: Self-pay | Admitting: Family Medicine

## 2020-03-12 NOTE — Telephone Encounter (Signed)
Us was normal

## 2020-03-12 NOTE — Telephone Encounter (Signed)
yes

## 2020-03-31 ENCOUNTER — Telehealth: Payer: Self-pay | Admitting: Family Medicine

## 2020-03-31 NOTE — Telephone Encounter (Signed)
Patient states blood pressure elevated last night, ear and headache. Patient states check Blood Pressure today  And normal but concern about her ear pain.

## 2020-03-31 NOTE — Telephone Encounter (Signed)
Pt needs appt

## 2020-04-01 ENCOUNTER — Other Ambulatory Visit: Payer: Self-pay | Admitting: Family Medicine

## 2020-04-01 DIAGNOSIS — I10 Essential (primary) hypertension: Secondary | ICD-10-CM

## 2020-05-05 ENCOUNTER — Telehealth: Payer: Self-pay | Admitting: Family Medicine

## 2020-05-05 ENCOUNTER — Other Ambulatory Visit: Payer: Self-pay | Admitting: Family Medicine

## 2020-05-05 DIAGNOSIS — I10 Essential (primary) hypertension: Secondary | ICD-10-CM

## 2020-05-05 MED ORDER — METOPROLOL SUCCINATE ER 25 MG PO TB24: 25.0000 mg | ORAL_TABLET | Freq: Every day | ORAL | 0 refills | Status: DC

## 2020-05-05 NOTE — Addendum Note (Signed)
Addended by: Roxanne Gates on: 05/05/2020 04:15 PM   Modules accepted: Orders

## 2020-05-05 NOTE — Telephone Encounter (Signed)
Refill sent.

## 2020-05-05 NOTE — Telephone Encounter (Signed)
Patient  Is requesting a refill until she can get in to see dr Laury Axon   Please advise     Medication:metoprolol succinate (TOPROL-XL) 25 MG 24 hr tablet [[id number]]    Has the patient contacted their pharmacy? No. (If no, request that the patient contact the pharmacy for the refill.) (If yes, when and what did the pharmacy advise?)  Preferred Pharmacy (with phone number or street name): [id number] - [place], [place] - [address]  [address], [place] [address]  Phone:  [phone] Fax:  [phone]   Agent: Please be advised that RX refills may take up to 3 business days. We ask that you follow-up with your pharmacy.

## 2020-05-11 ENCOUNTER — Ambulatory Visit: Payer: BC Managed Care – PPO | Admitting: Obstetrics & Gynecology

## 2020-05-28 ENCOUNTER — Telehealth: Payer: Self-pay | Admitting: *Deleted

## 2020-05-28 ENCOUNTER — Other Ambulatory Visit: Payer: Self-pay

## 2020-05-28 ENCOUNTER — Other Ambulatory Visit (HOSPITAL_COMMUNITY)
Admission: RE | Admit: 2020-05-28 | Discharge: 2020-05-28 | Disposition: A | Discharge status: Home / Self Care | Payer: BC Managed Care – PPO | Source: Ambulatory Visit | Attending: Family Medicine | Admitting: Family Medicine

## 2020-05-28 ENCOUNTER — Ambulatory Visit (INDEPENDENT_AMBULATORY_CARE_PROVIDER_SITE_OTHER): Payer: BC Managed Care – PPO | Admitting: Family Medicine

## 2020-05-28 ENCOUNTER — Encounter: Payer: Self-pay | Admitting: Family Medicine

## 2020-05-28 ENCOUNTER — Other Ambulatory Visit: Payer: Self-pay | Admitting: *Deleted

## 2020-05-28 VITALS — BP 137/87 | HR 109 | Ht 64.0 in | Wt 243.0 lb

## 2020-05-28 DIAGNOSIS — N940 Mittelschmerz: Secondary | ICD-10-CM

## 2020-05-28 DIAGNOSIS — N920 Excessive and frequent menstruation with regular cycle: Secondary | ICD-10-CM | POA: Diagnosis not present

## 2020-05-28 DIAGNOSIS — N979 Female infertility, unspecified: Secondary | ICD-10-CM | POA: Diagnosis not present

## 2020-05-28 DIAGNOSIS — Z124 Encounter for screening for malignant neoplasm of cervix: Secondary | ICD-10-CM | POA: Insufficient documentation

## 2020-05-28 DIAGNOSIS — N946 Dysmenorrhea, unspecified: Secondary | ICD-10-CM

## 2020-05-28 DIAGNOSIS — Z01411 Encounter for gynecological examination (general) (routine) with abnormal findings: Secondary | ICD-10-CM | POA: Diagnosis not present

## 2020-05-28 MED ORDER — DOXYCYCLINE HYCLATE 100 MG PO CAPS: 100.0000 mg | ORAL_CAPSULE | Freq: Two times a day (BID) | ORAL | 0 refills | Status: DC

## 2020-05-28 NOTE — Telephone Encounter (Signed)
So they can be placed during ov-- correct?

## 2020-05-28 NOTE — Assessment & Plan Note (Signed)
NSIADS and consider diagnostic laparoscopy as endometriosis could be culprit and unifying diagnosis.

## 2020-05-28 NOTE — Assessment & Plan Note (Signed)
Trial of doxycycline for presumptive treatment of possible chronic endometritis. Discussed hormonal options, not interested in contraception at this time. Would not be a candidate for estrogen containing options anyway due to BP and FH.

## 2020-05-28 NOTE — Progress Notes (Unsigned)
Pt has lab appt @ 8:30 on 11/22 and CPE w/PCP on same day at 3pm.  Please place future lab orders for 11/22 visit.

## 2020-05-28 NOTE — Telephone Encounter (Addendum)
Pt has lab appt @ 8:30 on 11/23 and CPE w/PCP on 11/22 at 3pm.  Please place future lab orders for 11/23 visit.

## 2020-05-28 NOTE — Assessment & Plan Note (Signed)
Trial of NSAIDS for pain and other symptoms for COX inhibition

## 2020-05-28 NOTE — Patient Instructions (Signed)
Preventive Care 21-33 Years Old, Female °Preventive care refers to visits with your health care provider and lifestyle choices that can promote health and wellness. This includes: °· A yearly physical exam. This may also be called an annual well check. °· Regular dental visits and eye exams. °· Immunizations. °· Screening for certain conditions. °· Healthy lifestyle choices, such as eating a healthy diet, getting regular exercise, not using drugs or products that contain nicotine and tobacco, and limiting alcohol use. °What can I expect for my preventive care visit? °Physical exam °Your health care provider will check your: °· Height and weight. This may be used to calculate body mass index (BMI), which tells if you are at a healthy weight. °· Heart rate and blood pressure. °· Skin for abnormal spots. °Counseling °Your health care provider may ask you questions about your: °· Alcohol, tobacco, and drug use. °· Emotional well-being. °· Home and relationship well-being. °· Sexual activity. °· Eating habits. °· Work and work environment. °· Method of birth control. °· Menstrual cycle. °· Pregnancy history. °What immunizations do I need? ° °Influenza (flu) vaccine °· This is recommended every year. °Tetanus, diphtheria, and pertussis (Tdap) vaccine °· You may need a Td booster every 10 years. °Varicella (chickenpox) vaccine °· You may need this if you have not been vaccinated. °Human papillomavirus (HPV) vaccine °· If recommended by your health care provider, you may need three doses over 6 months. °Measles, mumps, and rubella (MMR) vaccine °· You may need at least one dose of MMR. You may also need a second dose. °Meningococcal conjugate (MenACWY) vaccine °· One dose is recommended if you are age 19-21 years and a first-year college student living in a residence hall, or if you have one of several medical conditions. You may also need additional booster doses. °Pneumococcal conjugate (PCV13) vaccine °· You may need  this if you have certain conditions and were not previously vaccinated. °Pneumococcal polysaccharide (PPSV23) vaccine °· You may need one or two doses if you smoke cigarettes or if you have certain conditions. °Hepatitis A vaccine °· You may need this if you have certain conditions or if you travel or work in places where you may be exposed to hepatitis A. °Hepatitis B vaccine °· You may need this if you have certain conditions or if you travel or work in places where you may be exposed to hepatitis B. °Haemophilus influenzae type b (Hib) vaccine °· You may need this if you have certain conditions. °You may receive vaccines as individual doses or as more than one vaccine together in one shot (combination vaccines). Talk with your health care provider about the risks and benefits of combination vaccines. °What tests do I need? ° °Blood tests °· Lipid and cholesterol levels. These may be checked every 5 years starting at age 20. °· Hepatitis C test. °· Hepatitis B test. °Screening °· Diabetes screening. This is done by checking your blood sugar (glucose) after you have not eaten for a while (fasting). °· Sexually transmitted disease (STD) testing. °· BRCA-related cancer screening. This may be done if you have a family history of breast, ovarian, tubal, or peritoneal cancers. °· Pelvic exam and Pap test. This may be done every 3 years starting at age 21. Starting at age 30, this may be done every 5 years if you have a Pap test in combination with an HPV test. °Talk with your health care provider about your test results, treatment options, and if necessary, the need for more tests. °  Follow these instructions at home: °Eating and drinking ° °· Eat a diet that includes fresh fruits and vegetables, whole grains, lean protein, and low-fat dairy. °· Take vitamin and mineral supplements as recommended by your health care provider. °· Do not drink alcohol if: °? Your health care provider tells you not to drink. °? You are  pregnant, may be pregnant, or are planning to become pregnant. °· If you drink alcohol: °? Limit how much you have to 0-1 drink a day. °? Be aware of how much alcohol is in your drink. In the [place] one drink equals one 12 oz bottle of beer (355 mL), one 5 oz glass of wine (148 mL), or one 1½ oz glass of hard liquor (44 mL). °Lifestyle °· Take daily care of your teeth and gums. °· Stay active. Exercise for at least 30 minutes on 5 or more days each week. °· Do not use any products that contain nicotine or tobacco, such as cigarettes, e-cigarettes, and chewing tobacco. If you need help quitting, ask your health care provider. °· If you are sexually active, practice safe sex. Use a condom or other form of birth control (contraception) in order to prevent pregnancy and STIs (sexually transmitted infections). If you plan to become pregnant, see your health care provider for a preconception visit. °What's next? °· Visit your health care provider once a year for a well check visit. °· Ask your health care provider how often you should have your eyes and teeth checked. °· Stay up to date on all vaccines. °This information is not intended to replace advice given to you by your health care provider. Make sure you discuss any questions you have with your health care provider. °Document Revised: 03/08/2018 Document Reviewed: 03/08/2018 °Elsevier Patient Education © 2020 [employer] ° °

## 2020-05-28 NOTE — Progress Notes (Signed)
  Subjective:     Sherry Villegas is a 34 y.o. female and is here for a comprehensive physical exam. The patient reports problems - heavy cycles with pain.  Cycles are regular, monthly and last 7 days. They are extremely heavy in the last 3 months. Worse pain on right and very shrap and last 10-15 minutes and then has a clot come out. Has pain related to ovulation x 2-3 days. Becomes very gassy and cannot push the gas out. Pain radiates to rectum. Takes tylenol which doesn't work. Has not been able to get pregnant. Does have h/o pelvic infection with GC and Chlamydia but no PID. Partner with 2 kids.No pregnancy x 8 years.  The following portions of the patient's history were reviewed and updated as appropriate: allergies, current medications, past family history, past medical history, past social history, past surgical history and problem list.  Review of Systems Pertinent items noted in HPI and remainder of comprehensive ROS otherwise negative.   Objective:    BP 137/87   Pulse (!) 109   Ht $R'5\' 4"'pE$  (1.626 m)   Wt 243 lb (110.2 kg)   LMP 05/09/2020 (Exact Date)   BMI 41.71 kg/m  General appearance: alert, cooperative, appears stated age and moderately obese Head: Normocephalic, without obvious abnormality, atraumatic Neck: no adenopathy, supple, symmetrical, trachea midline and thyroid not enlarged, symmetric, no tenderness/mass/nodules Lungs: clear to auscultation bilaterally Breasts: normal appearance, no masses or tenderness, pendulous Heart: regular rate and rhythm, S1, S2 normal, no murmur, click, rub or gallop Abdomen: soft, non-tender; bowel sounds normal; no masses,  no organomegaly Pelvic: cervix normal in appearance, external genitalia normal, no adnexal masses or tenderness, no cervical motion tenderness, uterus normal size, shape, and consistency and vagina normal without discharge Extremities: extremities normal, atraumatic, no cyanosis or edema Pulses: 2+ and symmetric Skin:  Skin color, texture, turgor normal. No rashes or lesions Lymph nodes: Cervical, supraclavicular, and axillary nodes normal. Neurologic: Grossly normal    Assessment:    GYN female exam.      Plan:   Problem List Items Addressed This Visit      Unprioritized   Ovulation pain    Trial of NSAIDS for pain and other symptoms for COX inhibition      Infertility, female    Check ovulation prediction kit, HSG. Consider semen analysis as needed.      Relevant Orders   DG Hysterogram (HSG)   Menorrhagia with regular cycle - Primary    Trial of doxycycline for presumptive treatment of possible chronic endometritis. Discussed hormonal options, not interested in contraception at this time. Would not be a candidate for estrogen containing options anyway due to BP and FH.      Relevant Medications   doxycycline (VIBRAMYCIN) 100 MG capsule   Dysmenorrhea    NSIADS and consider diagnostic laparoscopy as endometriosis could be culprit and unifying diagnosis.       Other Visit Diagnoses    Screening for malignant neoplasm of cervix       Relevant Orders   Cytology - PAP( [facility])   Encounter for gynecological examination with abnormal finding         Return in 1 year (on 05/28/2021).    See After Visit Summary for Counseling Recommendations

## 2020-05-28 NOTE — Assessment & Plan Note (Signed)
Check ovulation prediction kit, HSG. Consider semen analysis as needed.

## 2020-05-29 ENCOUNTER — Other Ambulatory Visit: Payer: Self-pay | Admitting: Family Medicine

## 2020-05-29 DIAGNOSIS — Z0000 Encounter for general adult medical examination without abnormal findings: Secondary | ICD-10-CM

## 2020-05-29 NOTE — Telephone Encounter (Signed)
They can be placed during the office visit but they will need to be placed as future so they will show up in future orders when pt comes in for lab visit, otherwise we will not be able to see the orders.

## 2020-06-01 ENCOUNTER — Encounter: Payer: BC Managed Care – PPO | Admitting: Family Medicine

## 2020-06-02 ENCOUNTER — Other Ambulatory Visit: Payer: BC Managed Care – PPO

## 2020-06-02 LAB — CYTOLOGY - PAP
Chlamydia: NEGATIVE
Comment: NEGATIVE
Comment: NEGATIVE
Comment: NORMAL
Diagnosis: NEGATIVE
High risk HPV: NEGATIVE
Neisseria Gonorrhea: NEGATIVE

## 2020-06-12 ENCOUNTER — Encounter: Payer: BC Managed Care – PPO | Admitting: Family

## 2020-06-20 ENCOUNTER — Other Ambulatory Visit: Payer: Self-pay | Admitting: Family Medicine

## 2020-06-20 DIAGNOSIS — I10 Essential (primary) hypertension: Secondary | ICD-10-CM

## 2020-06-30 ENCOUNTER — Encounter: Payer: Self-pay | Admitting: Family

## 2020-06-30 ENCOUNTER — Ambulatory Visit (INDEPENDENT_AMBULATORY_CARE_PROVIDER_SITE_OTHER): Payer: BC Managed Care – PPO | Admitting: Family

## 2020-06-30 ENCOUNTER — Other Ambulatory Visit: Payer: Self-pay

## 2020-06-30 VITALS — BP 130/87 | HR 94 | Temp 98.5°F | Resp 16 | Ht 64.0 in | Wt 245.0 lb

## 2020-06-30 DIAGNOSIS — E785 Hyperlipidemia, unspecified: Secondary | ICD-10-CM

## 2020-06-30 DIAGNOSIS — I10 Essential (primary) hypertension: Secondary | ICD-10-CM

## 2020-06-30 DIAGNOSIS — Z803 Family history of malignant neoplasm of breast: Secondary | ICD-10-CM | POA: Diagnosis not present

## 2020-06-30 DIAGNOSIS — D649 Anemia, unspecified: Secondary | ICD-10-CM | POA: Diagnosis not present

## 2020-06-30 DIAGNOSIS — Z113 Encounter for screening for infections with a predominantly sexual mode of transmission: Secondary | ICD-10-CM | POA: Diagnosis not present

## 2020-06-30 DIAGNOSIS — Z2821 Immunization not carried out because of patient refusal: Secondary | ICD-10-CM

## 2020-06-30 DIAGNOSIS — Z0000 Encounter for general adult medical examination without abnormal findings: Secondary | ICD-10-CM | POA: Diagnosis not present

## 2020-06-30 NOTE — Progress Notes (Signed)
Subjective:    Patient ID: Sherry Villegas, female    DOB: 08/02/1985, 34 y.o.   MRN: [id number]  HPI  Patient presents today for complete physical.  Immunizations:  tdap 2018-11-23, declines flu shot, declines covid vaccine Diet: needs improvement  Wt Readings from Last 3 Encounters:  06/30/20 245 lb (111.1 kg)  05/28/20 243 lb (110.2 kg)  05/10/19 238 lb 6.4 oz (108.1 kg)  lives with boyfriend, cousin, step daughter Exercise: not currently.  Was walking earlier in the year.  She bought some exercise equipment for her hous.  Pap Smear:05/28/2020 Vision: scheduled Dental:  Up to date  Aunt was in her 11/23/22- died from breast cancer.    Maternal gm had breast cancer- she thinks in her late 40's.  She is unsure if they every had any genetic testing.   Reports increased anxiety- financial stressors, father has dementia and may be moving in with her. Family in in [place]. Brother is his primary care giver. She has not considered counseling.     Review of Systems  Constitutional: Negative for unexpected weight change.  HENT: Negative for hearing loss and rhinorrhea.   Eyes: Negative for visual disturbance.  Respiratory: Negative for cough and shortness of breath.   Cardiovascular: Negative for leg swelling.  Gastrointestinal: Negative for constipation and diarrhea.  Genitourinary: Negative for dysuria, frequency and hematuria.  Musculoskeletal: Positive for back pain (has history of low back pain, occasional shocking pain in her right leg).       Twisted her left ankle back in October- it is Mold[place] a little sore.  Neurological: Positive for headaches (rarely).  Hematological: Negative for adenopathy.  Psychiatric/Behavioral:       Denies depression, + increased anxiety.     Past Medical History:  Diagnosis Date  . Asthma   . Migraine   . Vaginal Pap smear, abnormal      Social History   Socioeconomic History  . Marital status: Single    Spouse name: Not on file  . Number of  children: Not on file  . Years of education: Not on file  . Highest education level: Associate degree: occupational, Scientist, product/process development, or vocational program  Occupational History  . Occupation: customer service rep   Tobacco Use  . Smoking status: Former Smoker    Packs/day: 0.25    Years: 9.00    Pack years: 2.25    Types: Cigarettes    Quit date: 02/14/2016    Years since quitting: 4.3  . Smokeless tobacco: Never Used  Vaping Use  . Vaping Use: Never used  Substance and Sexual Activity  . Alcohol use: Yes    Alcohol/week: 6.0 standard drinks    Types: 6 Shots of liquor per week  . Drug use: Yes    Types: Marijuana    Comment: special occassions only-- 1 x q16months  . Sexual activity: Yes    Partners: Male    Birth control/protection: None  Other Topics Concern  . Not on file  Social History Narrative  . Not on file   Social Determinants of Health   Financial Resource Strain: Not on file  Food Insecurity: Not on file  Transportation Needs: Not on file  Physical Activity: Not on file  Stress: Not on file  Social Connections: Not on file  Intimate Partner Violence: Not on file    No past surgical history on file.  Family History  Problem Relation Age of Onset  . Heart disease Mother 85  MI  . Stroke Mother   . Stroke Father   . Hypertension Father   . Breast cancer Maternal Grandmother   . Breast cancer Maternal Aunt   . Breast cancer Paternal Grandmother     Allergies  Allergen Reactions  . Dulera [Mometasone Furo-Formoterol Fum] Hives  . Amoxil [Amoxicillin] Swelling    Current Outpatient Medications on File Prior to Visit  Medication Sig Dispense Refill  . acetaminophen (TYLENOL) 325 MG tablet Take 650 mg by mouth every 6 (six) hours as needed.    Marland Kitchen albuterol (VENTOLIN HFA) 108 (90 Base) MCG/ACT inhaler Inhale 2 puffs into the lungs every 6 (six) hours as needed for wheezing or shortness of breath. 1 g 2  . doxycycline (VIBRAMYCIN) 100 MG capsule Take 1  capsule (100 mg total) by mouth 2 (two) times daily. 20 capsule 0  . famotidine (PEPCID) 20 MG tablet Take 1 tablet (20 mg total) by mouth daily. 30 tablet 0  . metoprolol succinate (TOPROL-XL) 25 MG 24 hr tablet Take 1 tablet by mouth once daily 30 tablet 0  . valACYclovir (VALTREX) 1000 MG tablet Take 1 tablet (1,000 mg total) by mouth 2 (two) times daily. 60 tablet 2   No current facility-administered medications on file prior to visit.    BP 130/87 (BP Location: Right Arm, Patient Position: Sitting, Cuff Size: Large)   Pulse 94   Temp 98.5 F (36.9 C) (Temporal)   Resp 16   Ht 5\' 4"  (1.626 m)   Wt 245 lb (111.1 kg)   SpO2 100%   BMI 42.05 kg/m       Objective:   Physical Exam Physical Exam  Constitutional: She is oriented to person, place, and time. She appears well-developed and well-nourished. No distress.  HENT:  Head: Normocephalic and atraumatic.  Right Ear: Tympanic membrane and ear canal normal.  Left Ear: Tympanic membrane and ear canal normal.  Mouth/Throat: Not examined- pt wearing mask Eyes: Pupils are equal, round, and reactive to light. No scleral icterus.  Neck: Normal range of motion. No thyromegaly present.  Cardiovascular: Normal rate and regular rhythm.   No murmur heard. Pulmonary/Chest: Effort normal and breath sounds normal. No respiratory distress. He has no wheezes. She has no rales. She exhibits no tenderness.  Abdominal: Soft. Bowel sounds are normal. She exhibits no distension and no mass. There is no tenderness. There is no rebound and no guarding.  Musculoskeletal: She exhibits no edema.  Lymphadenopathy:    She has no cervical adenopathy.  Neurological: She is alert and oriented to person, place, and time. She has normal patellar reflexes. She exhibits normal muscle tone. Coordination normal.  Skin: Skin is warm and dry.  Psychiatric: She has a normal mood and affect. Her behavior is normal. Judgment and thought content normal.   Breast/pelvic: deferred to GYN           Assessment & Plan:   Preventative care- discussed healthy diet, exercise and weight loss.  Pap up to date. Counseled pt on importance of covid-19 vaccination. She declines. She also declines flu shot- states that it caused her itching previously.   Family hx of breast cancer- will refer for genetic counseling due to her strong family history of breast cancer.  Anxiety- uncontrolled.  Largely situational it seems due to multiple stressors. Recommended that she consider counseling and and I gave her a number to call. Recommended that she follow up with PCP if symptoms worsen or if she does not find improvement in  her symptoms with counseling to discuss trial of medication.  HTN- bp is stable. Check renal function, continue toprol xl 25mg  once daily.  Anemia- check cbc,serum iron/ferritin.   STD screening- pt requests STD screening (via blood work). States she already had vaginal swab at GYN.  We did discuss that insurance is not always covering cpx related labs and that some of her lab work may not be covered by insurance.  This visit occurred during the SARS-CoV-2 public health emergency.  Safety protocols were in place, including screening questions prior to the visit, additional usage of staff PPE, and extensive cleaning of exam room while observing appropriate contact time as indicated for disinfecting solutions.        Assessment & Plan:

## 2020-06-30 NOTE — Patient Instructions (Addendum)
Please consider calling [phone] for counseling. Complete lab work prior to leaving.

## 2020-07-01 ENCOUNTER — Telehealth: Payer: Self-pay | Admitting: Genetic Counselor

## 2020-07-01 LAB — IRON: Iron: 23 ug/dL — ABNORMAL LOW (ref 42–145)

## 2020-07-01 LAB — CBC WITH DIFFERENTIAL/PLATELET
Basophils Absolute: 0.1 10*3/uL (ref 0.0–0.1)
Basophils Relative: 1.2 % (ref 0.0–3.0)
Eosinophils Absolute: 0.1 10*3/uL (ref 0.0–0.7)
Eosinophils Relative: 1.8 % (ref 0.0–5.0)
HCT: 30.8 % — ABNORMAL LOW (ref 36.0–46.0)
Hemoglobin: 9.4 g/dL — ABNORMAL LOW (ref 12.0–15.0)
Lymphocytes Relative: 25.1 % (ref 12.0–46.0)
Lymphs Abs: 1.7 10*3/uL (ref 0.7–4.0)
MCHC: 30.6 g/dL (ref 30.0–36.0)
MCV: 69.3 fl — ABNORMAL LOW (ref 78.0–100.0)
Monocytes Absolute: 0.4 10*3/uL (ref 0.1–1.0)
Monocytes Relative: 6 % (ref 3.0–12.0)
Neutro Abs: 4.4 10*3/uL (ref 1.4–7.7)
Neutrophils Relative %: 65.9 % (ref 43.0–77.0)
Platelets: 406 10*3/uL — ABNORMAL HIGH (ref 150.0–400.0)
RBC: 4.45 Mil/uL (ref 3.87–5.11)
RDW: 18 % — ABNORMAL HIGH (ref 11.5–15.5)
WBC: 6.6 10*3/uL (ref 4.0–10.5)

## 2020-07-01 LAB — COMPREHENSIVE METABOLIC PANEL
ALT: 10 U/L (ref 0–35)
AST: 13 U/L (ref 0–37)
Albumin: 4.1 g/dL (ref 3.5–5.2)
Alkaline Phosphatase: 75 U/L (ref 39–117)
BUN: 10 mg/dL (ref 6–23)
CO2: 25 mEq/L (ref 19–32)
Calcium: 9.5 mg/dL (ref 8.4–10.5)
Chloride: 104 mEq/L (ref 96–112)
Creatinine, Ser: 0.96 mg/dL (ref 0.40–1.20)
GFR: 77.94 mL/min (ref >60.00)
Glucose, Bld: 90 mg/dL (ref 70–99)
Potassium: 3.8 mEq/L (ref 3.5–5.1)
Sodium: 137 mEq/L (ref 135–145)
Total Bilirubin: 0.3 mg/dL (ref 0.2–1.2)
Total Protein: 7.5 g/dL (ref 6.0–8.3)

## 2020-07-01 LAB — LIPID PANEL
Cholesterol: 196 mg/dL (ref 0–200)
HDL: 71.3 mg/dL (ref >39.00)
LDL Cholesterol: 102 mg/dL — ABNORMAL HIGH (ref 0–99)
NonHDL: 124.71
Total CHOL/HDL Ratio: 3
Triglycerides: 114 mg/dL (ref 0.0–149.0)
VLDL: 22.8 mg/dL (ref 0.0–40.0)

## 2020-07-01 LAB — FERRITIN: Ferritin: 9.7 ng/mL — ABNORMAL LOW (ref 10.0–291.0)

## 2020-07-01 LAB — TSH: TSH: 2.62 u[IU]/mL (ref 0.35–4.50)

## 2020-07-01 NOTE — Telephone Encounter (Signed)
Received a genetic counseling referral from [facility] for fhx of breast cancer. Sherry Villegas has been cld and scheduled to see Irving Burton on 1/27 at 3pm.

## 2020-07-02 ENCOUNTER — Telehealth: Payer: Self-pay | Admitting: Family

## 2020-07-02 DIAGNOSIS — D509 Iron deficiency anemia, unspecified: Secondary | ICD-10-CM | POA: Insufficient documentation

## 2020-07-02 LAB — RPR+HSVIGM+HBSAG+HSV2(IGG)+...
HIV Screen 4th Generation wRfx: NONREACTIVE
HSV 2 IgG, Type Spec: 4.97 index — ABNORMAL HIGH (ref 0.00–0.90)
HSVI/II Comb IgM: <0.91 Ratio (ref 0.00–0.90)
Hepatitis B Surface Ag: NEGATIVE
RPR Ser Ql: NONREACTIVE

## 2020-07-02 LAB — HSV-2 IGG SUPPLEMENTAL TEST: HSV-2 IgG Supplemental Test: POSITIVE — AB

## 2020-07-02 MED ORDER — IRON 325 (65 FE) MG PO TABS: 1.0000 | ORAL_TABLET | Freq: Every day | ORAL | 0 refills | Status: DC

## 2020-07-02 NOTE — Telephone Encounter (Signed)
See mychart messages

## 2020-07-21 ENCOUNTER — Other Ambulatory Visit: Payer: Self-pay | Admitting: Family Medicine

## 2020-07-21 DIAGNOSIS — I10 Essential (primary) hypertension: Secondary | ICD-10-CM

## 2020-07-21 MED ORDER — METOPROLOL SUCCINATE ER 25 MG PO TB24: 25.0000 mg | ORAL_TABLET | Freq: Every day | ORAL | 5 refills | Status: DC

## 2020-08-06 ENCOUNTER — Inpatient Hospital Stay: Payer: BC Managed Care – PPO

## 2020-08-06 ENCOUNTER — Inpatient Hospital Stay: Payer: BC Managed Care – PPO | Attending: Genetic Counselor | Admitting: Licensed Clinical Social Worker

## 2020-08-20 DIAGNOSIS — R109 Unspecified abdominal pain: Secondary | ICD-10-CM | POA: Diagnosis not present

## 2020-08-20 DIAGNOSIS — R1031 Right lower quadrant pain: Secondary | ICD-10-CM | POA: Diagnosis not present

## 2020-08-20 DIAGNOSIS — Z20822 Contact with and (suspected) exposure to covid-19: Secondary | ICD-10-CM | POA: Diagnosis not present

## 2020-08-20 DIAGNOSIS — R110 Nausea: Secondary | ICD-10-CM | POA: Diagnosis not present

## 2020-08-20 DIAGNOSIS — K5900 Constipation, unspecified: Secondary | ICD-10-CM | POA: Diagnosis not present

## 2020-08-20 DIAGNOSIS — Z3202 Encounter for pregnancy test, result negative: Secondary | ICD-10-CM | POA: Diagnosis not present

## 2020-08-20 DIAGNOSIS — R197 Diarrhea, unspecified: Secondary | ICD-10-CM | POA: Diagnosis not present

## 2020-08-20 DIAGNOSIS — N838 Other noninflammatory disorders of ovary, fallopian tube and broad ligament: Secondary | ICD-10-CM | POA: Diagnosis not present

## 2020-08-21 DIAGNOSIS — N838 Other noninflammatory disorders of ovary, fallopian tube and broad ligament: Secondary | ICD-10-CM | POA: Diagnosis not present

## 2020-08-21 DIAGNOSIS — R109 Unspecified abdominal pain: Secondary | ICD-10-CM | POA: Diagnosis not present

## 2020-09-16 ENCOUNTER — Ambulatory Visit: Payer: BC Managed Care – PPO | Admitting: Obstetrics & Gynecology

## 2020-12-02 DIAGNOSIS — R1030 Lower abdominal pain, unspecified: Secondary | ICD-10-CM | POA: Diagnosis not present

## 2020-12-02 DIAGNOSIS — R1013 Epigastric pain: Secondary | ICD-10-CM | POA: Diagnosis not present

## 2020-12-02 DIAGNOSIS — K625 Hemorrhage of anus and rectum: Secondary | ICD-10-CM | POA: Diagnosis not present

## 2020-12-02 DIAGNOSIS — D509 Iron deficiency anemia, unspecified: Secondary | ICD-10-CM | POA: Diagnosis not present

## 2020-12-08 ENCOUNTER — Telehealth: Payer: Self-pay | Admitting: Family Medicine

## 2020-12-08 NOTE — Telephone Encounter (Signed)
Medication:  valACYclovir (VALTREX) 1000 MG tablet [[id number]]      Has the patient contacted their pharmacy? yes (If no, request that the patient contact the pharmacy for the refill.) (If yes, when and what did the pharmacy advise?) Call your doc    Preferred Pharmacy (with phone number or street name): [id number] - [place], [place] - [address]  [address], [place] [address]  Phone:  [phone] Fax:  [phone]      Agent: Please be advised that RX refills may take up to 3 business days. We ask that you follow-up with your pharmacy.

## 2020-12-09 ENCOUNTER — Other Ambulatory Visit: Payer: Self-pay

## 2020-12-09 MED ORDER — VALACYCLOVIR HCL 1 G PO TABS: 1000.0000 mg | ORAL_TABLET | Freq: Two times a day (BID) | ORAL | 2 refills | Status: DC

## 2020-12-09 NOTE — Telephone Encounter (Signed)
Refill sent.

## 2020-12-14 ENCOUNTER — Ambulatory Visit (INDEPENDENT_AMBULATORY_CARE_PROVIDER_SITE_OTHER): Payer: BC Managed Care – PPO | Admitting: Family Medicine

## 2020-12-14 ENCOUNTER — Other Ambulatory Visit: Payer: Self-pay

## 2020-12-14 ENCOUNTER — Ambulatory Visit (HOSPITAL_BASED_OUTPATIENT_CLINIC_OR_DEPARTMENT_OTHER)
Admission: RE | Admit: 2020-12-14 | Discharge: 2020-12-14 | Disposition: A | Discharge status: Home / Self Care | Payer: BC Managed Care – PPO | Source: Ambulatory Visit | Attending: Family Medicine | Admitting: Family Medicine

## 2020-12-14 ENCOUNTER — Encounter: Payer: Self-pay | Admitting: Family Medicine

## 2020-12-14 VITALS — BP 134/84 | HR 103 | Temp 99.0°F | Resp 18 | Wt 254.6 lb

## 2020-12-14 DIAGNOSIS — J449 Chronic obstructive pulmonary disease, unspecified: Secondary | ICD-10-CM

## 2020-12-14 DIAGNOSIS — R059 Cough, unspecified: Secondary | ICD-10-CM | POA: Diagnosis not present

## 2020-12-14 DIAGNOSIS — K219 Gastro-esophageal reflux disease without esophagitis: Secondary | ICD-10-CM | POA: Diagnosis not present

## 2020-12-14 DIAGNOSIS — B354 Tinea corporis: Secondary | ICD-10-CM | POA: Diagnosis not present

## 2020-12-14 DIAGNOSIS — L509 Urticaria, unspecified: Secondary | ICD-10-CM

## 2020-12-14 DIAGNOSIS — K649 Unspecified hemorrhoids: Secondary | ICD-10-CM | POA: Insufficient documentation

## 2020-12-14 DIAGNOSIS — I10 Essential (primary) hypertension: Secondary | ICD-10-CM | POA: Diagnosis not present

## 2020-12-14 MED ORDER — NAFTIFINE HCL 2 % EX CREA: TOPICAL_CREAM | CUTANEOUS | 2 refills | Status: DC

## 2020-12-14 MED ORDER — METOPROLOL SUCCINATE ER 50 MG PO TB24: 50.0000 mg | ORAL_TABLET | Freq: Every day | ORAL | 3 refills | Status: DC

## 2020-12-14 MED ORDER — PANTOPRAZOLE SODIUM 40 MG PO TBEC: 40.0000 mg | DELAYED_RELEASE_TABLET | Freq: Two times a day (BID) | ORAL | 3 refills | Status: DC

## 2020-12-14 NOTE — Progress Notes (Signed)
Patient ID: Sherry Villegas, female    DOB: 1985/07/25  Age: 35 y.o. MRN: [id number]    Subjective:  Subjective  HPI Sherry Villegas presents for an office visit today. She complains of hives x1 month around the oral region and upper lips. She notes that she may be allergic to something. She endorses taking Zyrtec and it had help relieved her symptoms. She notes that she has bumps under her breast due to the heat. She reports that she had not used any OTC or ointment for the bumps under her breast. She complains of abd complications. She notes that her GI had dx her with GERD and bright red blood per rectum. She also complains of hemorrhoids. She reports that the hemorrhoids began after her visit with her GI on 12/02/2020. She states that she had been using OTC to help relieved her hemorrhoids. She endorse eating an increase amount of salad and shake. She reports that the hemorrhoids itches.   She denies any chest pain, fever, , chills, sore throat, dysuria, urinary incontinence, back pain, or HA at this time. [facility]. She notes that she had not taken 25 mg of metoprolol succinate PO Daily for her dx of HTN today.  She also c/o cough and sob with rattling in her chest esp with exertion -- -she has not had f/u with pulmonary since 2017-- she was supposed to see them in 6 weeks.     BP Readings from Last 3 Encounters:  12/14/20 134/84  06/30/20 130/87  05/28/20 137/87     Review of Systems  Constitutional: Negative for chills, fatigue and fever.  HENT: Negative for ear pain, rhinorrhea, sinus pressure, sinus pain, sore throat and tinnitus.   Eyes: Negative for pain.  Respiratory: Negative for cough, shortness of breath and wheezing.   Cardiovascular: Negative for chest pain.  Gastrointestinal:       (+) GERD (+) abdominal complications   Genitourinary: Negative for flank pain.       (+) hemorrhoids   Musculoskeletal: Negative for back pain  and neck pain.  Skin:       (+) hives (+) bumps under breast   Allergic/Immunologic: Positive for environmental allergies and food allergies.  Neurological: Negative for seizures, weakness, light-headedness, numbness and headaches.    History Past Medical History:  Diagnosis Date  . Asthma   . Migraine   . Vaginal Pap smear, abnormal     She has no past surgical history on file.   Her family history includes Breast cancer in her maternal aunt, maternal grandmother, and paternal grandmother; Heart disease (age of onset: 28) in her mother; Hypertension in her father; Stroke in her father and mother.She reports that she quit smoking about 4 years ago. Her smoking use included cigarettes. She has a 2.25 pack-year smoking history. She has never used smokeless tobacco. She reports current alcohol use of about 6.0 standard drinks of alcohol per week. She reports current drug use. Drug: Marijuana.  Current Outpatient Medications on File Prior to Visit  Medication Sig Dispense Refill  . acetaminophen (TYLENOL) 325 MG tablet Take 650 mg by mouth every 6 (six) hours as needed.    Marland Kitchen albuterol (VENTOLIN HFA) 108 (90 Base) MCG/ACT inhaler Inhale 2 puffs into the lungs every 6 (six) hours as needed for wheezing or shortness of breath. 1 g 2  . doxycycline (VIBRAMYCIN) 100 MG capsule Take 1 capsule (100 mg total) by mouth 2 (two) times daily.  20 capsule 0  . famotidine (PEPCID) 20 MG tablet Take 1 tablet (20 mg total) by mouth daily. 30 tablet 0  . Ferrous Sulfate (IRON) 325 (65 Fe) MG TABS Take 1 tablet (325 mg total) by mouth daily. 30 tablet 0  . valACYclovir (VALTREX) 1000 MG tablet Take 1 tablet (1,000 mg total) by mouth 2 (two) times daily. 60 tablet 2   No current facility-administered medications on file prior to visit.     Objective:  Objective  Physical Exam Vitals and nursing note reviewed.  Constitutional:      General: She is not in acute distress.    Appearance: Normal  appearance. She is well-developed. She is not ill-appearing.  HENT:     Head: Normocephalic and atraumatic.     Right Ear: Tympanic membrane, ear canal and external ear normal.     Left Ear: Tympanic membrane, ear canal and external ear normal.     Nose: Nose normal.  Eyes:     General:        Right eye: No discharge.        Left eye: No discharge.     Extraocular Movements: Extraocular movements intact.     Pupils: Pupils are equal, round, and reactive to light.  Cardiovascular:     Rate and Rhythm: Normal rate and regular rhythm.     Pulses: Normal pulses.     Heart sounds: Normal heart sounds. No murmur heard. No friction rub. No gallop.   Pulmonary:     Effort: Pulmonary effort is normal. No respiratory distress.     Breath sounds: Normal breath sounds. No stridor. No wheezing, rhonchi or rales.  Chest:     Chest wall: No tenderness.  Abdominal:     General: Bowel sounds are normal. There is no distension.     Palpations: Abdomen is soft. There is no mass.     Tenderness: There is no abdominal tenderness. There is no guarding or rebound.     Hernia: No hernia is present.  Musculoskeletal:        General: Normal range of motion.     Cervical back: Normal range of motion and neck supple.     Right lower leg: No edema.     Left lower leg: No edema.  Skin:    General: Skin is warm and dry.  Neurological:     Mental Status: She is alert and oriented to person, place, and time.  Psychiatric:        Behavior: Behavior normal.        Thought Content: Thought content normal.    BP 134/84   Pulse (!) 103   Temp 99 F (37.2 C)   Resp 18   Wt 254 lb 9.6 oz (115.5 kg)   SpO2 99%   BMI 43.70 kg/m  Wt Readings from Last 3 Encounters:  12/14/20 254 lb 9.6 oz (115.5 kg)  06/30/20 245 lb (111.1 kg)  05/28/20 243 lb (110.2 kg)     Lab Results  Component Value Date   WBC 6.6 06/30/2020   HGB 9.4 (L) 06/30/2020   HCT 30.8 (L) 06/30/2020   PLT 406.0 (H) 06/30/2020    GLUCOSE 90 06/30/2020   CHOL 196 06/30/2020   TRIG 114.0 06/30/2020   HDL 71.30 06/30/2020   LDLCALC 102 (H) 06/30/2020   ALT 10 06/30/2020   AST 13 06/30/2020   NA 137 06/30/2020   K 3.8 06/30/2020   CL 104 06/30/2020   CREATININE 0.96  06/30/2020   BUN 10 06/30/2020   CO2 25 06/30/2020   TSH 2.62 06/30/2020   MICROALBUR 7.0 (H) 08/25/2015    No results found.   Assessment & Plan:  Plan    Meds ordered this encounter  Medications  . pantoprazole (PROTONIX) 40 MG tablet    Sig: Take 1 tablet (40 mg total) by mouth 2 (two) times daily.    Dispense:  60 tablet    Refill:  3  . Naftifine HCl 2 % CREA    Sig: Apply qd    Dispense:  60 g    Refill:  2  . metoprolol succinate (TOPROL-XL) 50 MG 24 hr tablet    Sig: Take 1 tablet (50 mg total) by mouth daily. Take with or immediately following a meal.    Dispense:  90 tablet    Refill:  3    Problem List Items Addressed This Visit      Unprioritized   Asthmatic bronchitis , chronic (HCC)    F/u pulmonary con't albuterol She started smoking again  She will work on stopping again  Check cxr      Cough   Relevant Orders   DG Chest 2 View   Gastroesophageal reflux disease    Per GI Pt starts protonix bid today       Relevant Medications   pantoprazole (PROTONIX) 40 MG tablet   Hemorrhoids    Pt using otc and they are improving Very small per pt F/u GI Inc fiber in diet Stool softener        Relevant Medications   metoprolol succinate (TOPROL-XL) 50 MG 24 hr tablet   Morbid obesity (HCC)    Refer to [facility]      Relevant Orders   Amb Ref to Medical Weight Management   Primary hypertension    Poorly controlled will alter medications, encouraged DASH diet, minimize caffeine and obtain adequate sleep. Report concerning symptoms and follow up as directed and as needed Pt also tachycardic  Inc toprol 50 mg daily       Relevant Medications   metoprolol succinate (TOPROL-XL) 50 MG 24 hr tablet   Tinea  corporis    Under breast  naftin cream rto prn       Relevant Medications   Naftifine HCl 2 % CREA   Urticaria - Primary    con't zyrtec daily  Refer to allergy      Relevant Orders   Ambulatory referral to Allergy    Other Visit Diagnoses    Hives          Follow-up: Return in about 6 months (around 06/15/2021), or if symptoms worsen or fail to improve, for hypertension.   I,Gordon Zheng,acting as a Neurosurgeon for [employer], DO.,have documented all relevant documentation on the behalf of Donato Schultz, DO,as directed by  Donato Schultz, DO while in the presence of Donato Schultz, DO.   I, Donato Schultz, DO, have reviewed all documentation for this visit. The documentation on 12/14/20 for the exam, diagnosis, procedures, and orders are all accurate and complete.  Pt visit >40 min with >50 % face to face reviewing chart/ care everywhere and discussing her multiple problems

## 2020-12-14 NOTE — Assessment & Plan Note (Signed)
Refer to [facility]  

## 2020-12-14 NOTE — Assessment & Plan Note (Signed)
con't zyrtec daily  Refer to allergy

## 2020-12-14 NOTE — Assessment & Plan Note (Signed)
Per GI Pt starts protonix bid today

## 2020-12-14 NOTE — Assessment & Plan Note (Signed)
Pt using otc and they are improving Very small per pt F/u GI Inc fiber in diet Stool softener

## 2020-12-14 NOTE — Assessment & Plan Note (Signed)
F/u pulmonary con't albuterol She started smoking again  She will work on stopping again  Check cxr

## 2020-12-14 NOTE — Assessment & Plan Note (Signed)
Poorly controlled will alter medications, encouraged DASH diet, minimize caffeine and obtain adequate sleep. Report concerning symptoms and follow up as directed and as needed Pt also tachycardic  Inc toprol 50 mg daily

## 2020-12-14 NOTE — Assessment & Plan Note (Signed)
Under breast  naftin cream rto prn

## 2020-12-14 NOTE — Patient Instructions (Signed)
Hemorrhoids Hemorrhoids are swollen veins in and around the rectum or anus. There are two types of hemorrhoids:  Internal hemorrhoids. These occur in the veins that are just inside the rectum. They may poke through to the outside and become irritated and painful.  External hemorrhoids. These occur in the veins that are outside the anus and can be felt as a painful swelling or hard lump near the anus. Most hemorrhoids do not cause serious problems, and they can be managed with home treatments such as diet and lifestyle changes. If home treatments do not help the symptoms, procedures can be done to shrink or remove the hemorrhoids. What are the causes? This condition is caused by increased pressure in the anal area. This pressure may result from various things, including:  Constipation.  Straining to have a bowel movement.  Diarrhea.  Pregnancy.  Obesity.  Sitting for long periods of time.  Heavy lifting or other activity that causes you to strain.  Anal sex.  Riding a bike for a long period of time. What are the signs or symptoms? Symptoms of this condition include:  Pain.  Anal itching or irritation.  Rectal bleeding.  Leakage of stool (feces).  Anal swelling.  One or more lumps around the anus. How is this diagnosed? This condition can often be diagnosed through a visual exam. Other exams or tests may also be done, such as:  An exam that involves feeling the rectal area with a gloved hand (digital rectal exam).  An exam of the anal canal that is done using a small tube (anoscope).  A blood test, if you have lost a significant amount of blood.  A test to look inside the colon using a flexible tube with a camera on the end (sigmoidoscopy or colonoscopy). How is this treated? This condition can usually be treated at home. However, various procedures may be done if dietary changes, lifestyle changes, and other home treatments do not help your symptoms. These  procedures can help make the hemorrhoids smaller or remove them completely. Some of these procedures involve surgery, and others do not. Common procedures include:  Rubber band ligation. Rubber bands are placed at the base of the hemorrhoids to cut off their blood supply.  Sclerotherapy. Medicine is injected into the hemorrhoids to shrink them.  Infrared coagulation. A type of light energy is used to get rid of the hemorrhoids.  Hemorrhoidectomy surgery. The hemorrhoids are surgically removed, and the veins that supply them are tied off.  Stapled hemorrhoidopexy surgery. The surgeon staples the base of the hemorrhoid to the rectal wall. Follow these instructions at home: Eating and drinking  Eat foods that have a lot of fiber in them, such as whole grains, beans, nuts, fruits, and vegetables.  Ask your health care provider about taking products that have added fiber (fiber supplements).  Reduce the amount of fat in your diet. You can do this by eating low-fat dairy products, eating less red meat, and avoiding processed foods.  Drink enough fluid to keep your urine pale yellow.   Managing pain and swelling  Take warm sitz baths for 20 minutes, 3-4 times a day to ease pain and discomfort. You may do this in a bathtub or using a portable sitz bath that fits over the toilet.  If directed, apply ice to the affected area. Using ice packs between sitz baths may be helpful. ? Put ice in a plastic bag. ? Place a towel between your skin and the bag. ? Leave   the ice on for 20 minutes, 2-3 times a day.   General instructions  Take over-the-counter and prescription medicines only as told by your health care provider.  Use medicated creams or suppositories as told.  Get regular exercise. Ask your health care provider how much and what kind of exercise is best for you. In general, you should do moderate exercise for at least 30 minutes on most days of the week (150 minutes each week). This can  include activities such as walking, biking, or yoga.  Go to the bathroom when you have the urge to have a bowel movement. Do not wait.  Avoid straining to have bowel movements.  Keep the anal area dry and clean. Use wet toilet paper or moist towelettes after a bowel movement.  Do not sit on the toilet for long periods of time. This increases blood pooling and pain.  Keep all follow-up visits as told by your health care provider. This is important. Contact a health care provider if you have:  Increasing pain and swelling that are not controlled by treatment or medicine.  Difficulty having a bowel movement, or you are unable to have a bowel movement.  Pain or inflammation outside the area of the hemorrhoids. Get help right away if you have:  Uncontrolled bleeding from your rectum. Summary  Hemorrhoids are swollen veins in and around the rectum or anus.  Most hemorrhoids can be managed with home treatments such as diet and lifestyle changes.  Taking warm sitz baths can help ease pain and discomfort.  In severe cases, procedures or surgery can be done to shrink or remove the hemorrhoids. This information is not intended to replace advice given to you by your health care provider. Make sure you discuss any questions you have with your health care provider. Document Revised: 11/23/2018 Document Reviewed: 11/16/2017 Elsevier Patient Education  2021 Else[employer] Inc.  Food Choices for Gastroesophageal Reflux Disease, Adult When you have gastroesophageal reflux disease (GERD), the foods you eat and your eating habits are very important. Choosing the right foods can help ease your discomfort. Think about working with a food expert (dietitian) to help you make good choices. What are tips for following this plan? Reading food labels  Look for foods that are low in saturated fat. Foods that may help with your symptoms include: ? Foods that have less than 5% of daily value (DV) of  fat. ? Foods that have 0 grams of trans fat. Cooking  Do not fry your food.  Cook your food by baking, steaming, grilling, or broiling. These are all methods that do not need a lot of fat for cooking.  To add flavor, try to use herbs that are low in spice and acidity. Meal planning  Choose healthy foods that are low in fat, such as: ? Fruits and vegetables. ? Whole grains. ? Low-fat dairy products. ? Lean meats, fish, and poultry.  Eat small meals often instead of eating 3 large meals each day. Eat your meals slowly in a place where you are relaxed. Avoid bending over or lying down until 2-3 hours after eating.  Limit high-fat foods such as fatty meats or fried foods.  Limit your intake of fatty foods, such as oils, butter, and shortening.  Avoid the following as told by your doctor: ? Foods that cause symptoms. These may be different for different people. Keep a food diary to keep track of foods that cause symptoms. ? Alcohol. ? Drinking a lot of liquid with meals. ?  Eating meals during the 2-3 hours before bed.   Lifestyle  Stay at a healthy weight. Ask your doctor what weight is healthy for you. If you need to lose weight, work with your doctor to do so safely.  Exercise for at least 30 minutes on 5 or more days each week, or as told by your doctor.  Wear loose-fitting clothes.  Do not smoke or use any products that contain nicotine or tobacco. If you need help quitting, ask your doctor.  Sleep with the head of your bed higher than your feet. Use a wedge under the mattress or blocks under the bed frame to raise the head of the bed.  Chew sugar-free gum after meals. What foods should eat? Eat a healthy, well-balanced diet of fruits, vegetables, whole grains, low-fat dairy products, lean meats, fish, and poultry. Each person is different. Foods that may cause symptoms in one person may not cause any symptoms in another person. Work with your doctor to find foods that are  safe for you. The items listed above may not be a complete list of what you can eat and drink. Contact a food expert for more options.   What foods should I avoid? Limiting some of these foods may help in managing the symptoms of GERD. Everyone is different. Talk with a food expert or your doctor to help you find the exact foods to avoid, if any. Fruits Any fruits prepared with added fat. Any fruits that cause symptoms. For some people, this may include citrus fruits, such as oranges, grapefruit, pineapple, and lemons. Vegetables Deep-fried vegetables. [place] fries. Any vegetables prepared with added fat. Any vegetables that cause symptoms. For some people, this may include tomatoes and tomato products, chili peppers, onions and garlic, and horseradish. Grains Pastries or quick breads with added fat. Meats and other proteins High-fat meats, such as fatty beef or pork, hot dogs, ribs, ham, sausage, salami, and bacon. Fried meat or protein, including fried fish and fried chicken. Nuts and nut butters, in large amounts. Dairy Whole milk and chocolate milk. Sour cream. Cream. Ice cream. Cream cheese. Milkshakes. Fats and oils Butter. Margarine. Shortening. Ghee. Beverages Coffee and tea, with or without caffeine. Carbonated beverages. Sodas. Energy drinks. Fruit juice made with acidic fruits, such as orange or grapefruit. Tomato juice. Alcoholic drinks. Sweets and desserts Chocolate and cocoa. Donuts. Seasonings and condiments Pepper. Peppermint and spearmint. Added salt. Any condiments, herbs, or seasonings that cause symptoms. For some people, this may include curry, hot sauce, or vinegar-based salad dressings. The items listed above may not be a complete list of what you should not eat and drink. Contact a food expert for more options. Questions to ask your doctor Diet and lifestyle changes are often the first steps that are taken to manage symptoms of GERD. If diet and lifestyle changes do  not help, talk with your doctor about taking medicines. Where to find more information  International [employer] for Gastrointestinal Disorders: aboutgerd.org Summary  When you have GERD, food and lifestyle choices are very important in easing your symptoms.  Eat small meals often instead of 3 large meals a day. Eat your meals slowly and in a place where you are relaxed.  Avoid bending over or lying down until 2-3 hours after eating.  Limit high-fat foods such as fatty meats or fried foods. This information is not intended to replace advice given to you by your health care provider. Make sure you discuss any questions you have with your health  care provider. Document Revised: 01/06/2020 Document Reviewed: 01/06/2020 Elsevier Patient Education  2021 [employer].

## 2020-12-16 ENCOUNTER — Other Ambulatory Visit: Payer: Self-pay

## 2020-12-16 ENCOUNTER — Telehealth: Payer: Self-pay

## 2020-12-16 MED ORDER — QVAR REDIHALER 40 MCG/ACT IN AERB: 2.0000 | INHALATION_SPRAY | Freq: Two times a day (BID) | RESPIRATORY_TRACT | 2 refills | Status: DC

## 2020-12-16 MED ORDER — AZITHROMYCIN 250 MG PO TABS: ORAL_TABLET | ORAL | 0 refills | Status: AC

## 2020-12-16 NOTE — Telephone Encounter (Signed)
PA stared via CoverMyMeds:  Jaquaya Gelb  Key: Ross Ludwig - PA   Case ID: [id number]  Need help? Call us at [phone]  Status Sent to [employer]  Drug Naftifine HCl 2% cream  Form Photographer PA Form [phone] [facility])

## 2020-12-16 NOTE — Telephone Encounter (Signed)
Pt called stating she was in on Monday to see PCP and the ointment/cream she was prescribed was $500 and she is wanting to know if there is something less expensive she can be prescribed to use for the rash or an OTC treatment she can use for it.  Please advise.

## 2020-12-17 ENCOUNTER — Other Ambulatory Visit: Payer: Self-pay | Admitting: Family Medicine

## 2020-12-17 DIAGNOSIS — B354 Tinea corporis: Secondary | ICD-10-CM

## 2020-12-17 MED ORDER — NYSTATIN 100000 UNIT/GM EX CREA: 1.0000 "application " | TOPICAL_CREAM | Freq: Two times a day (BID) | CUTANEOUS | 0 refills | Status: DC

## 2020-12-17 NOTE — Telephone Encounter (Signed)
Pt advised. Pt also advised that Nystatin needed a PA. Will let her know when or if it is approved.

## 2020-12-22 NOTE — Telephone Encounter (Signed)
The Nafitidine was denied coverage. But the pt was able to pick up the Nystatin with no issues and says her sxs have resolved. Appeal to the PA is not needed for the Nafitidine

## 2021-01-14 DIAGNOSIS — Z8711 Personal history of peptic ulcer disease: Secondary | ICD-10-CM | POA: Diagnosis not present

## 2021-01-14 DIAGNOSIS — B9681 Helicobacter pylori [H. pylori] as the cause of diseases classified elsewhere: Secondary | ICD-10-CM | POA: Diagnosis not present

## 2021-01-14 DIAGNOSIS — R1030 Lower abdominal pain, unspecified: Secondary | ICD-10-CM | POA: Diagnosis not present

## 2021-01-14 DIAGNOSIS — K625 Hemorrhage of anus and rectum: Secondary | ICD-10-CM | POA: Diagnosis not present

## 2021-01-14 DIAGNOSIS — R197 Diarrhea, unspecified: Secondary | ICD-10-CM | POA: Diagnosis not present

## 2021-01-14 DIAGNOSIS — K648 Other hemorrhoids: Secondary | ICD-10-CM | POA: Diagnosis not present

## 2021-01-14 DIAGNOSIS — R1013 Epigastric pain: Secondary | ICD-10-CM | POA: Diagnosis not present

## 2021-01-14 DIAGNOSIS — K2950 Unspecified chronic gastritis without bleeding: Secondary | ICD-10-CM | POA: Diagnosis not present

## 2021-01-14 DIAGNOSIS — D509 Iron deficiency anemia, unspecified: Secondary | ICD-10-CM | POA: Diagnosis not present

## 2021-02-11 ENCOUNTER — Ambulatory Visit: Payer: BC Managed Care – PPO | Admitting: Allergy & Immunology

## 2021-03-16 ENCOUNTER — Other Ambulatory Visit: Payer: Self-pay

## 2021-03-16 ENCOUNTER — Ambulatory Visit (INDEPENDENT_AMBULATORY_CARE_PROVIDER_SITE_OTHER): Payer: BC Managed Care – PPO | Admitting: Family Medicine

## 2021-03-16 ENCOUNTER — Encounter (INDEPENDENT_AMBULATORY_CARE_PROVIDER_SITE_OTHER): Payer: Self-pay | Admitting: Family Medicine

## 2021-03-16 VITALS — BP 110/76 | HR 76 | Temp 98.0°F | Ht 64.0 in | Wt 247.0 lb

## 2021-03-16 DIAGNOSIS — Z9189 Other specified personal risk factors, not elsewhere classified: Secondary | ICD-10-CM

## 2021-03-16 DIAGNOSIS — R0602 Shortness of breath: Secondary | ICD-10-CM | POA: Diagnosis not present

## 2021-03-16 DIAGNOSIS — D509 Iron deficiency anemia, unspecified: Secondary | ICD-10-CM | POA: Diagnosis not present

## 2021-03-16 DIAGNOSIS — I10 Essential (primary) hypertension: Secondary | ICD-10-CM

## 2021-03-16 DIAGNOSIS — R5383 Other fatigue: Secondary | ICD-10-CM

## 2021-03-16 DIAGNOSIS — Z1331 Encounter for screening for depression: Secondary | ICD-10-CM

## 2021-03-16 DIAGNOSIS — Z6841 Body Mass Index (BMI) 40.0 and over, adult: Secondary | ICD-10-CM

## 2021-03-16 DIAGNOSIS — Z0289 Encounter for other administrative examinations: Secondary | ICD-10-CM

## 2021-03-16 DIAGNOSIS — E785 Hyperlipidemia, unspecified: Secondary | ICD-10-CM | POA: Diagnosis not present

## 2021-03-16 NOTE — Progress Notes (Signed)
Chief Complaint:   OBESITY Sherry Villegas (MR# [id number]) is a 35 y.o. female who presents for evaluation and treatment of obesity and related comorbidities. Current BMI is Body mass index is 43.7 kg/m. Sherry Villegas has been struggling with her weight for many years and has been unsuccessful in either losing weight, maintaining weight loss, or reaching her healthy weight goal.  Sherry Villegas is currently in the action stage of change and ready to dedicate time achieving and maintaining a healthier weight. Sherry Villegas is interested in becoming our patient and working on intensive lifestyle modifications including (but not limited to) diet and exercise for weight loss.  Sherry Villegas's habits were reviewed today and are as follows: Her family eats meals together, she thinks her family will eat healthier with her, her desired weight loss is 47 lbs, she started gaining weight 2-3 years ago, she is a picky eater and doesn't like to eat healthier foods, she has significant food cravings issues, she skips meals frequently, she is frequently drinking liquids with calories, she frequently makes poor food choices, she frequently eats larger portions than normal, and she struggles with emotional eating.  Depression Screen Sherry Villegas's Food and Mood (modified PHQ-9) score was 7.  Depression screen PHQ 2/9 03/16/2021  Decreased Interest 1  Down, Depressed, Hopeless 1  PHQ - 2 Score 2  Altered sleeping 0  Tired, decreased energy 3  Change in appetite 1  Feeling bad or failure about yourself  0  Trouble concentrating 0  Moving slowly or fidgety/restless 1  Suicidal thoughts 0  PHQ-9 Score 7  Difficult doing work/chores Not difficult at all   Subjective:   1. Other fatigue Sherry Villegas admits to daytime somnolence and admits to waking up Sherry Villegas tired. Patent has a history of symptoms of daytime fatigue. Sherry Villegas generally gets 4 or 6 hours of sleep per night, and states that she has difficulty falling asleep. Snoring is  present. Apneic episodes are not present. Epworth Sleepiness Score is 3.  2. SOB (shortness of breath) on exertion Sherry Villegas notes increasing shortness of breath with exercising and seems to be worsening over time with weight gain. She notes getting out of breath sooner with activity than she used to. This has not gotten worse recently. Sherry Villegas denies shortness of breath at rest or orthopnea.  3. Primary hypertension Sherry Villegas's blood pressure is stable on Toprol XL, and she is working on diet and exercise.  4. Hyperlipidemia, unspecified hyperlipidemia type Sherry Villegas is ready to work on diet to improve cholesterol.  5. Iron deficiency anemia, unspecified iron deficiency anemia type Sherry Villegas is not on iron currently, and she is followed by GI. She notes fatigue.  6. At risk for heart disease Sherry Villegas is at a higher than average risk for cardiovascular disease due to obesity.   Assessment/Plan:   1. Other fatigue Sherry Villegas does feel that her weight is causing her energy to be lower than it should be. Fatigue may be related to obesity, depression or many other causes. Labs will be ordered, and in the meanwhile, Sherry Villegas will focus on self care including making healthy food choices, increasing physical activity and focusing on stress reduction.  - Insulin, random - Hemoglobin A1c - Vitamin B12 - VITAMIN D 25 Hydroxy (Vit-D Deficiency, Fractures) - TSH - Folate - T3 - EKG 12-Lead - T4, free  2. SOB (shortness of breath) on exertion Sherry Villegas does feel that she gets out of breath more easily that she used to when she exercises. Sherry Villegas's shortness of breath appears  to be obesity related and exercise induced. She has agreed to work on weight loss and gradually increase exercise to treat her exercise induced shortness of breath. Will continue to monitor closely.  3. Primary hypertension Sherry Villegas will continue working on healthy weight loss and exercise to improve blood pressure control. We will check  labs today, and will watch for signs of hypotension as she continues her lifestyle modifications.  - Comprehensive metabolic panel  4. Hyperlipidemia, unspecified hyperlipidemia type Cardiovascular risk and specific lipid/LDL goals reviewed. We discussed several lifestyle modifications today. We will check labs today. Sherry Villegas will continue to work on diet, exercise and weight loss efforts. Orders and follow up as documented in patient record.   - Lipid Panel With LDL/HDL Ratio  5. Iron deficiency anemia, unspecified iron deficiency anemia type We will check labs today. Sherry Villegas will start her iron rich diet, and we will follow up at her next visit. Orders and follow up as documented in patient record.  - CBC With Differential  6. Screening for depression Sherry Villegas had a positive depression screening. Depression is commonly associated with obesity and often results in emotional eating behaviors. We will monitor this closely and work on CBT to help improve the non-hunger eating patterns. Referral to Psychology may be required if no improvement is seen as she continues in our clinic.  7. At risk for heart disease Sherry Villegas was given approximately 30 minutes of coronary artery disease prevention counseling today. She is 35 y.o. female and has risk factors for heart disease including obesity. We discussed intensive lifestyle modifications today with an emphasis on specific weight loss instructions and strategies.   Repetitive spaced learning was employed today to elicit superior memory formation and behavioral change.  8. Obesity with current zbmi 42.5 Sherry Villegas is currently in the action stage of change and her goal is to continue with weight loss efforts. I recommend Sherry Villegas begin the structured treatment plan as follows:  She has agreed to the Category 2 Plan + 100 calories.  Exercise goals: No exercise has been prescribed for now, while we concentrate on nutritional changes.   Behavioral  modification strategies: no skipping meals and meal planning and cooking strategies.  She was informed of the importance of frequent follow-up visits to maximize her success with intensive lifestyle modifications for her multiple health conditions. She was informed we would discuss her lab results at her next visit unless there is a critical issue that needs to be addressed sooner. Sherry Villegas agreed to keep her next visit at the agreed upon time to discuss these results.  Objective:   Blood pressure 110/76, pulse 76, temperature 98 F (36.7 C), height 5\' 4"  (1.626 m), SpO2 99 %. Body mass index is 43.7 kg/m.  EKG: Normal sinus rhythm, rate 82 BPM.  Indirect Calorimeter completed today shows a VO2 of 282 and a REE of 1944.  Her calculated basal metabolic rate is thus her basal metabolic rate is better than expected.  General: Cooperative, alert, well developed, in no acute distress. HEENT: Conjunctivae and lids unremarkable. Cardiovascular: Regular rhythm.  Lungs: Normal work of breathing. Neurologic: No focal deficits.   Lab Results  Component Value Date   CREATININE 0.94 03/16/2021   BUN 8 03/16/2021   NA 139 03/16/2021   K 3.9 03/16/2021   CL 101 03/16/2021   CO2 21 03/16/2021   Lab Results  Component Value Date   ALT 14 03/16/2021   AST 19 03/16/2021   ALKPHOS 99 03/16/2021   BILITOT  0.2 03/16/2021   Lab Results  Component Value Date   HGBA1C 5.7 (H) 03/16/2021   Lab Results  Component Value Date   INSULIN 28.7 (H) 03/16/2021   Lab Results  Component Value Date   TSH 2.960 03/16/2021   Lab Results  Component Value Date   CHOL 227 (H) 03/16/2021   HDL 64 03/16/2021   LDLCALC 136 (H) 03/16/2021   TRIG 151 (H) 03/16/2021   CHOLHDL 3 06/30/2020   Lab Results  Component Value Date   WBC 7.1 03/16/2021   HGB 10.1 (L) 03/16/2021   HCT 34.5 03/16/2021   MCV 73 (L) 03/16/2021   PLT 406.0 (H) 06/30/2020   Lab Results  Component Value Date   IRON 23 (L)  06/30/2020   FERRITIN 9.7 (L) 06/30/2020   Attestation Statements:   Reviewed by clinician on day of visit: allergies, medications, problem list, medical history, surgical history, family history, social history, and previous encounter notes.   I, Burt Knack, am acting as transcriptionist for Quillian Quince, MD.  I have reviewed the above documentation for accuracy and completeness, and I agree with the above. - Quillian Quince, MD

## 2021-03-17 LAB — COMPREHENSIVE METABOLIC PANEL
ALT: 14 IU/L (ref 0–32)
AST: 19 IU/L (ref 0–40)
Albumin/Globulin Ratio: 1.3 (ref 1.2–2.2)
Albumin: 4.3 g/dL (ref 3.8–4.8)
Alkaline Phosphatase: 99 IU/L (ref 44–121)
BUN/Creatinine Ratio: 9 (ref 9–23)
BUN: 8 mg/dL (ref 6–20)
Bilirubin Total: 0.2 mg/dL (ref 0.0–1.2)
CO2: 21 mmol/L (ref 20–29)
Calcium: 9.3 mg/dL (ref 8.7–10.2)
Chloride: 101 mmol/L (ref 96–106)
Creatinine, Ser: 0.94 mg/dL (ref 0.57–1.00)
Globulin, Total: 3.2 g/dL (ref 1.5–4.5)
Glucose: 82 mg/dL (ref 65–99)
Potassium: 3.9 mmol/L (ref 3.5–5.2)
Sodium: 139 mmol/L (ref 134–144)
Total Protein: 7.5 g/dL (ref 6.0–8.5)
eGFR: 82 mL/min/{1.73_m2} (ref >59)

## 2021-03-17 LAB — LIPID PANEL WITH LDL/HDL RATIO
Cholesterol, Total: 227 mg/dL — ABNORMAL HIGH (ref 100–199)
HDL: 64 mg/dL (ref >39)
LDL Chol Calc (NIH): 136 mg/dL — ABNORMAL HIGH (ref 0–99)
LDL/HDL Ratio: 2.1 ratio (ref 0.0–3.2)
Triglycerides: 151 mg/dL — ABNORMAL HIGH (ref 0–149)
VLDL Cholesterol Cal: 27 mg/dL (ref 5–40)

## 2021-03-17 LAB — T3: T3, Total: 151 ng/dL (ref 71–180)

## 2021-03-17 LAB — CBC WITH DIFFERENTIAL
Basophils Absolute: 0 10*3/uL (ref 0.0–0.2)
Basos: 0 %
EOS (ABSOLUTE): 0.2 10*3/uL (ref 0.0–0.4)
Eos: 3 %
Hematocrit: 34.5 % (ref 34.0–46.6)
Hemoglobin: 10.1 g/dL — ABNORMAL LOW (ref 11.1–15.9)
Immature Grans (Abs): 0 10*3/uL (ref 0.0–0.1)
Immature Granulocytes: 0 %
Lymphocytes Absolute: 2.5 10*3/uL (ref 0.7–3.1)
Lymphs: 35 %
MCH: 21.5 pg — ABNORMAL LOW (ref 26.6–33.0)
MCHC: 29.3 g/dL — ABNORMAL LOW (ref 31.5–35.7)
MCV: 73 fL — ABNORMAL LOW (ref 79–97)
Monocytes Absolute: 0.4 10*3/uL (ref 0.1–0.9)
Monocytes: 6 %
Neutrophils Absolute: 4 10*3/uL (ref 1.4–7.0)
Neutrophils: 56 %
RBC: 4.7 x10E6/uL (ref 3.77–5.28)
RDW: 18.6 % — ABNORMAL HIGH (ref 11.7–15.4)
WBC: 7.1 10*3/uL (ref 3.4–10.8)

## 2021-03-17 LAB — HEMOGLOBIN A1C
Est. average glucose Bld gHb Est-mCnc: 117 mg/dL
Hgb A1c MFr Bld: 5.7 % — ABNORMAL HIGH (ref 4.8–5.6)

## 2021-03-17 LAB — VITAMIN D 25 HYDROXY (VIT D DEFICIENCY, FRACTURES): Vit D, 25-Hydroxy: 11.1 ng/mL — ABNORMAL LOW (ref 30.0–100.0)

## 2021-03-17 LAB — VITAMIN B12: Vitamin B-12: 214 pg/mL — ABNORMAL LOW (ref 232–1245)

## 2021-03-17 LAB — FOLATE: Folate: 7.4 ng/mL (ref >3.0)

## 2021-03-17 LAB — TSH: TSH: 2.96 u[IU]/mL (ref 0.450–4.500)

## 2021-03-17 LAB — T4, FREE: Free T4: 1.19 ng/dL (ref 0.82–1.77)

## 2021-03-17 LAB — INSULIN, RANDOM: INSULIN: 28.7 u[IU]/mL — ABNORMAL HIGH (ref 2.6–24.9)

## 2021-03-22 ENCOUNTER — Telehealth: Payer: Self-pay | Admitting: Family Medicine

## 2021-03-22 NOTE — Telephone Encounter (Signed)
Pt called regarding what she experienced over the weekend. Pt did not go into depth due to privacy reasons and will like a call back at [phone]. Please advise.

## 2021-03-24 NOTE — Telephone Encounter (Signed)
Spoke with patient. Pt states she Wojcicki having nausea and vomiting the first 2-3 days of her period. Pt states she was seen at GI and had a endo done. She was tested for Celiac and IBS. Both came back negative. She did have H. Pylori and a ulcer. Pt does have a appointment with GYN on October 3rd. Pt states GI referred her back to pcp

## 2021-03-30 ENCOUNTER — Ambulatory Visit (INDEPENDENT_AMBULATORY_CARE_PROVIDER_SITE_OTHER): Payer: BC Managed Care – PPO | Admitting: Family Medicine

## 2021-03-31 NOTE — Telephone Encounter (Signed)
Pt called. LVM 

## 2021-04-04 DIAGNOSIS — R1030 Lower abdominal pain, unspecified: Secondary | ICD-10-CM | POA: Diagnosis not present

## 2021-04-04 DIAGNOSIS — R300 Dysuria: Secondary | ICD-10-CM | POA: Diagnosis not present

## 2021-04-04 DIAGNOSIS — N390 Urinary tract infection, site not specified: Secondary | ICD-10-CM | POA: Diagnosis not present

## 2021-04-12 ENCOUNTER — Ambulatory Visit: Payer: BC Managed Care – PPO | Admitting: Obstetrics & Gynecology

## 2021-05-10 ENCOUNTER — Other Ambulatory Visit: Payer: Self-pay

## 2021-05-10 ENCOUNTER — Ambulatory Visit (INDEPENDENT_AMBULATORY_CARE_PROVIDER_SITE_OTHER): Payer: BC Managed Care – PPO | Admitting: Family Medicine

## 2021-05-10 ENCOUNTER — Other Ambulatory Visit (HOSPITAL_COMMUNITY)
Admission: RE | Admit: 2021-05-10 | Discharge: 2021-05-10 | Disposition: A | Discharge status: Home / Self Care | Payer: BC Managed Care – PPO | Source: Ambulatory Visit | Attending: Obstetrics & Gynecology | Admitting: Obstetrics & Gynecology

## 2021-05-10 VITALS — BP 117/97 | HR 81 | Temp 98.5°F | Wt 250.1 lb

## 2021-05-10 DIAGNOSIS — R102 Pelvic and perineal pain unspecified side: Secondary | ICD-10-CM

## 2021-05-10 DIAGNOSIS — L732 Hidradenitis suppurativa: Secondary | ICD-10-CM | POA: Diagnosis not present

## 2021-05-10 DIAGNOSIS — N898 Other specified noninflammatory disorders of vagina: Secondary | ICD-10-CM | POA: Insufficient documentation

## 2021-05-10 DIAGNOSIS — N946 Dysmenorrhea, unspecified: Secondary | ICD-10-CM | POA: Diagnosis not present

## 2021-05-10 DIAGNOSIS — N979 Female infertility, unspecified: Secondary | ICD-10-CM

## 2021-05-10 MED ORDER — NORETHINDRONE ACETATE 5 MG PO TABS: 5.0000 mg | ORAL_TABLET | Freq: Every day | ORAL | 3 refills | Status: DC

## 2021-05-10 MED ORDER — FLUCONAZOLE 150 MG PO TABS: 150.0000 mg | ORAL_TABLET | Freq: Once | ORAL | 0 refills | Status: AC

## 2021-05-10 MED ORDER — ONDANSETRON HCL 4 MG PO TABS: 4.0000 mg | ORAL_TABLET | Freq: Three times a day (TID) | ORAL | 0 refills | Status: DC | PRN

## 2021-05-10 MED ORDER — HIBICLENS 4 % EX LIQD: Freq: Every day | CUTANEOUS | 3 refills | Status: DC | PRN

## 2021-05-10 NOTE — Progress Notes (Signed)
Subjective:    Patient ID: Sherry Villegas is a 35 y.o. female presenting with Gynecologic Exam  on 05/10/2021  HPI: Reports increasing pain while on her cycle. Previously seen with ovulation pain and infertility. Now pain is with cycles and severe. She has strong f/h breast Ca and is reluctant to use any form of birth control. She is having N/V and cannot eat during the first few days of her cycle. She notes pain and cramping with her cycles. Happens during the first 3 days. She reports pain with intercourse at times. We had previously ordered an HSG which she has not completed yet. Recent Abx for H. Pylori and UTI, with vaginal irritation despite OTC yeast treatment. Also, notes breakouts on her butt cheeks she thinks due to prolonged sitting. It is bilateral.  Review of Systems  Constitutional:  Negative for chills and fever.  Respiratory:  Negative for shortness of breath.   Cardiovascular:  Negative for chest pain.  Gastrointestinal:  Negative for abdominal pain, nausea and vomiting.  Genitourinary:  Negative for dysuria.  Skin:  Negative for rash.     Objective:    BP (!) 117/97 (BP Location: Left Arm, Patient Position: Sitting, Cuff Size: Large)   Pulse 81   Temp 98.5 F (36.9 C) (Oral)   Wt 250 lb 1.3 oz (113.4 kg)   LMP 04/18/2021 (Exact Date)   BMI 42.93 kg/m  Physical Exam Exam conducted with a chaperone present.  Constitutional:      General: She is not in acute distress.    Appearance: She is well-developed.  HENT:     Head: Normocephalic and atraumatic.  Eyes:     General: No scleral icterus. Cardiovascular:     Rate and Rhythm: Normal rate.  Pulmonary:     Effort: Pulmonary effort is normal.  Abdominal:     Palpations: Abdomen is soft.  Genitourinary:    General: Normal vulva.     Exam position: Lithotomy position.     Vagina: Normal.     Cervix: Normal.     Uterus: Normal. Not enlarged and not tender.      Adnexa:        Right: No mass or  tenderness.         Left: No mass or tenderness.    Musculoskeletal:     Cervical back: Neck supple.  Skin:    General: Skin is warm and dry.  Neurological:     Mental Status: She is alert and oriented to person, place, and time.        Assessment & Plan:   Problem List Items Addressed This Visit       Unprioritized   Infertility, female    May schedule HSG on day 5 of cycle.      Relevant Medications   norethindrone (AYGESTIN) 5 MG tablet   Dysmenorrhea    Trial of zofran and aygestin, just with cycles to improve symptoms. NSAIDS as pre-treatment with cycles.      Relevant Medications   norethindrone (AYGESTIN) 5 MG tablet   ondansetron (ZOFRAN) 4 MG tablet   Pelvic pain    Will trial Aygestin + pelvic PT to see if this helps.      Relevant Orders   Ambulatory referral to Physical Therapy   Other Visit Diagnoses     Vaginal discharge    -  Primary   presumptive rx for Diflucan--cultures pending.   Relevant Orders   Cervicovaginal ancillary only   Vaginal irritation  Relevant Orders   Cervicovaginal ancillary only   Hidradenitis       ? possible--will give trial of hibiclens to area   Relevant Medications   chlorhexidine (HIBICLENS) 4 % external liquid      Return in about 3 months (around 08/10/2021).  Reva Bores 05/10/2021 11:22 AM

## 2021-05-11 ENCOUNTER — Encounter: Payer: Self-pay | Admitting: Family Medicine

## 2021-05-11 DIAGNOSIS — R102 Pelvic and perineal pain: Secondary | ICD-10-CM | POA: Insufficient documentation

## 2021-05-11 LAB — CERVICOVAGINAL ANCILLARY ONLY
Bacterial Vaginitis (gardnerella): NEGATIVE
Candida Glabrata: NEGATIVE
Candida Vaginitis: POSITIVE — AB
Chlamydia: NEGATIVE
Comment: NEGATIVE
Comment: NEGATIVE
Comment: NEGATIVE
Comment: NEGATIVE
Comment: NEGATIVE
Comment: NORMAL
Neisseria Gonorrhea: NEGATIVE
Trichomonas: NEGATIVE

## 2021-05-11 NOTE — Assessment & Plan Note (Signed)
May schedule HSG on day 5 of cycle.

## 2021-05-11 NOTE — Assessment & Plan Note (Signed)
Trial of zofran and aygestin, just with cycles to improve symptoms. NSAIDS as pre-treatment with cycles.

## 2021-05-11 NOTE — Assessment & Plan Note (Signed)
Will trial Aygestin + pelvic PT to see if this helps.

## 2021-05-27 ENCOUNTER — Other Ambulatory Visit: Payer: Self-pay

## 2021-05-27 ENCOUNTER — Ambulatory Visit (INDEPENDENT_AMBULATORY_CARE_PROVIDER_SITE_OTHER): Payer: BC Managed Care – PPO | Admitting: Family Medicine

## 2021-05-27 ENCOUNTER — Encounter: Payer: Self-pay | Admitting: Family Medicine

## 2021-05-27 VITALS — BP 143/97 | HR 90 | Wt 255.0 lb

## 2021-05-27 DIAGNOSIS — N920 Excessive and frequent menstruation with regular cycle: Secondary | ICD-10-CM | POA: Diagnosis not present

## 2021-05-27 DIAGNOSIS — L732 Hidradenitis suppurativa: Secondary | ICD-10-CM

## 2021-05-27 DIAGNOSIS — N979 Female infertility, unspecified: Secondary | ICD-10-CM | POA: Diagnosis not present

## 2021-05-27 DIAGNOSIS — N946 Dysmenorrhea, unspecified: Secondary | ICD-10-CM

## 2021-05-27 MED ORDER — TRANEXAMIC ACID 650 MG PO TABS
1300.0000 mg | ORAL_TABLET | Freq: Three times a day (TID) | ORAL | 2 refills | Status: DC
Start: 2021-05-27 — End: 2021-10-06

## 2021-05-27 MED ORDER — HIBICLENS 4 % EX LIQD: Freq: Every day | CUTANEOUS | 3 refills | Status: DC | PRN

## 2021-05-27 NOTE — Progress Notes (Signed)
   Subjective:    Patient ID: Sherry Villegas, female    DOB: [birth year], 35 y.o.   MRN: [id number]  HPI Patient seen for follow-up of menorrhagia and dysmenorrhea.  Patient trialed taking Aygestin just during her cycles to see if this was helpful.  She bled for approximately 14 days with a lot of cramping and a lot of pain.  Her cycle was pretty heavy.  She just took the Aygestin for 5 or 6 days because that is how long her periods are normally.  She has not gotten the HSG yet.   Review of Systems     Objective:   Physical Exam Vitals reviewed.  Constitutional:      Appearance: Normal appearance.  Skin:    General: Skin is warm and dry.     Capillary Refill: Capillary refill takes less than 2 seconds.  Neurological:     General: No focal deficit present.     Mental Status: She is alert.       Assessment & Plan:  1. Hidradenitis - chlorhexidine (HIBICLENS) 4 % external liquid; Apply topically daily as needed.  Dispense: 120 mL; Refill: 3  2. Infertility, female Recommended the HSG.  If her fallopian tubes are patent, we could trial Femara to see if this would be helpful.  Did recommend getting an ovulation kits to see if she is ovulating  3. Menorrhagia with regular cycle At this point, the patient does not want to do hormonal birth control that contains estrogen mainly because of strong family history of breast cancer, cardiac disease, stroke.  Additionally, she does not want to do continuous progesterone as she would like to get pregnant.  We will trial Lysteda to see if this is helpful.  4. Dysmenorrhea

## 2021-06-17 IMAGING — US US PELVIS COMPLETE WITH TRANSVAGINAL
1 series · 13 of 25 positions shown · non-contrast
Comparison: None

CLINICAL DATA: Heavy vaginal bleeding, history of ovarian cysts,
LMP 10/24/2019

EXAM:
TRANSABDOMINAL AND TRANSVAGINAL ULTRASOUND OF PELVIS
TECHNIQUE: Both transabdominal and transvaginal ultrasound examinations of the
pelvis were performed. Transabdominal technique was performed for
global imaging of the pelvis including uterus, ovaries, adnexal
regions, and pelvic cul-de-sac. It was necessary to proceed with
endovaginal exam following the transabdominal exam to visualize the
uterus, endometrium and ovaries.

[Series 1: us pelvis complete with transvaginal · 13 of 36 slices shown]
[im 1/36]
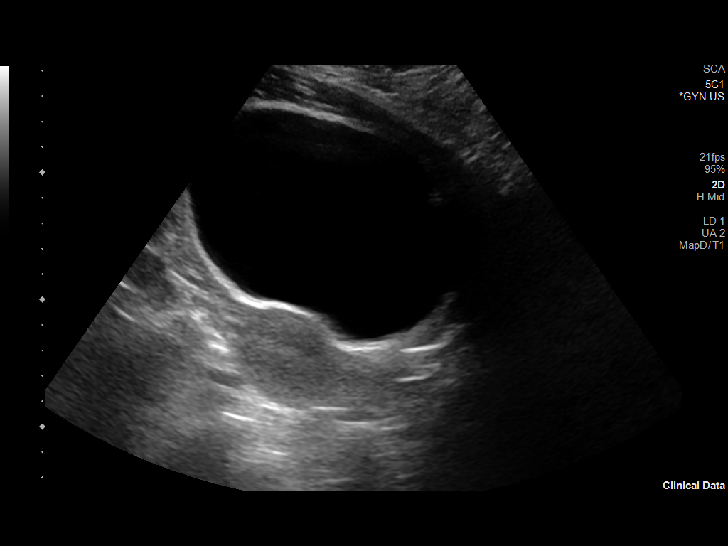
[im 3/36]
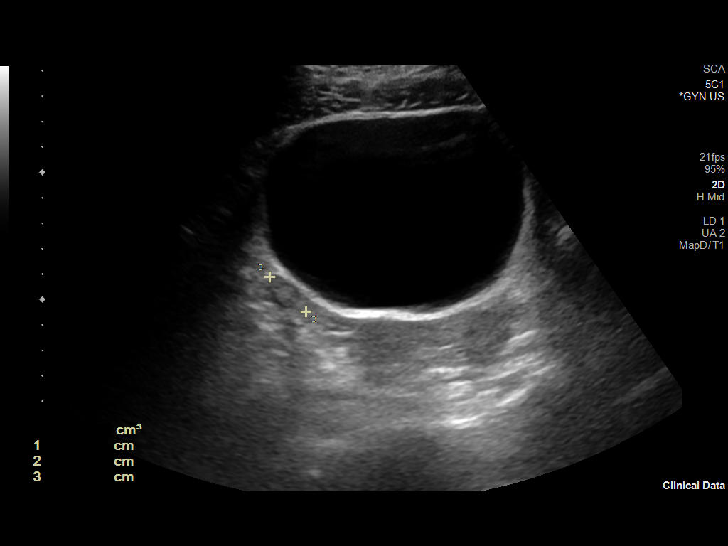
[im 6/36]
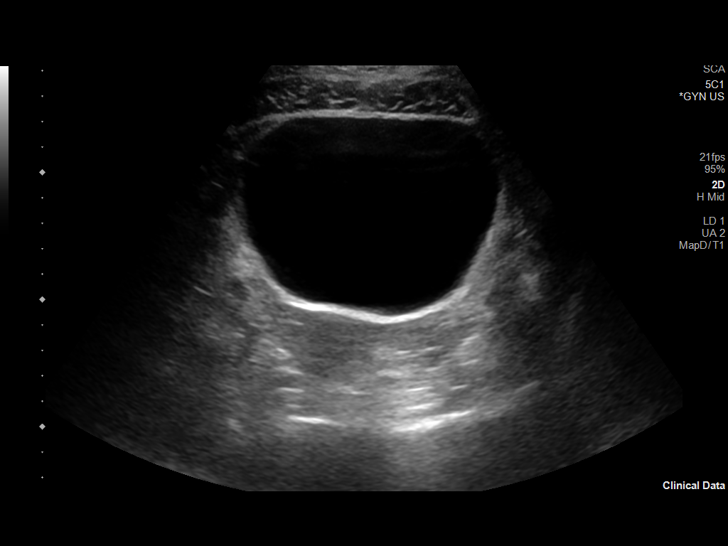
[im 9/36]
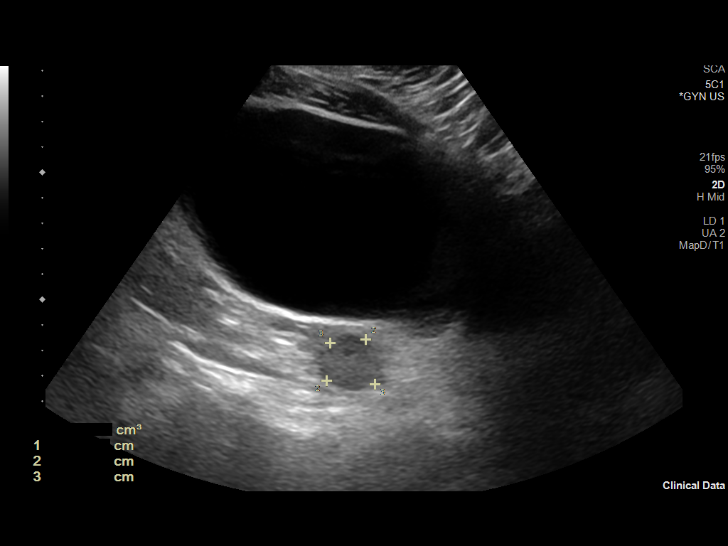
[im 12/36]
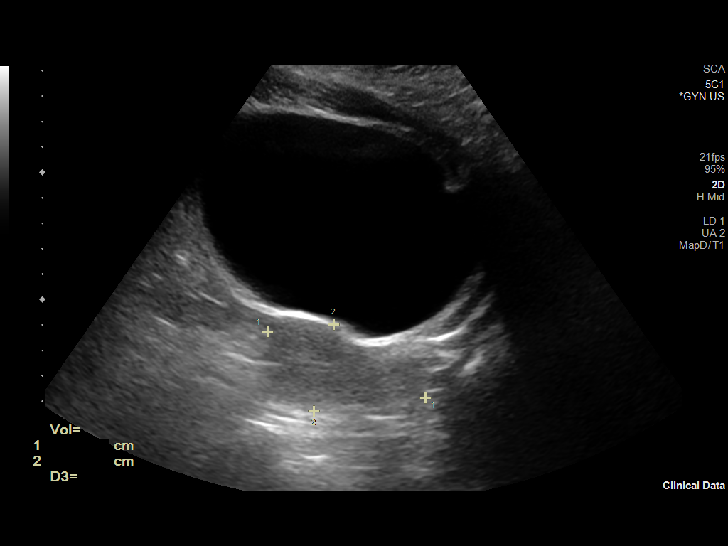
[im 15/36]
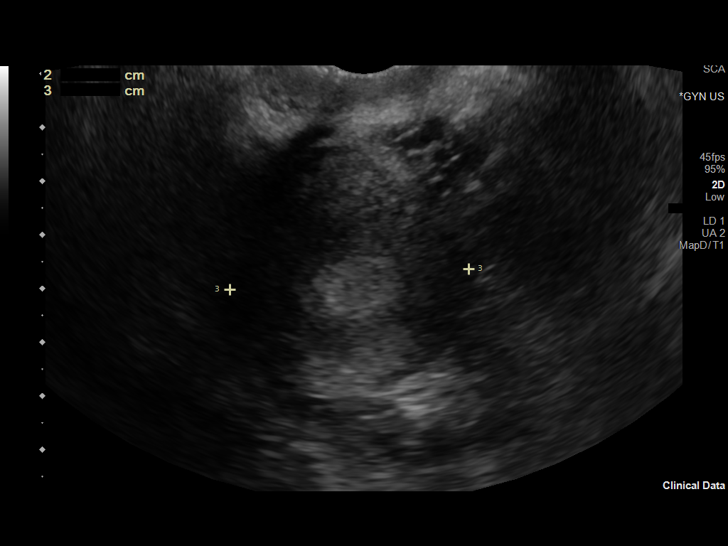
[im 18/36]
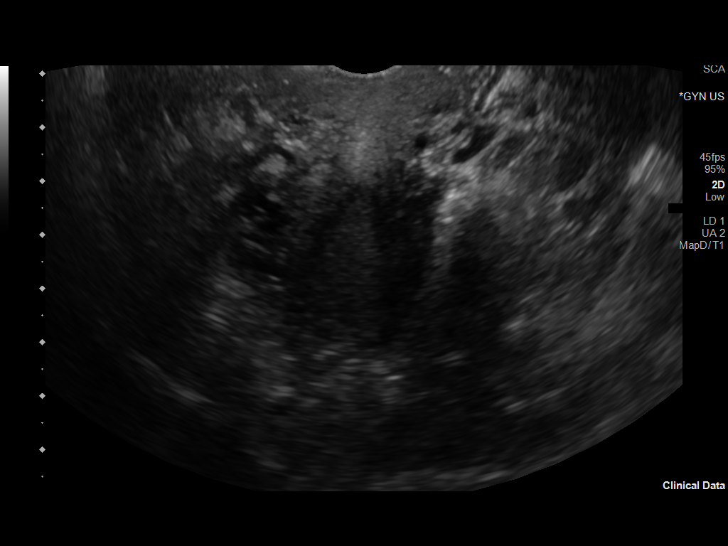
[im 21/36]
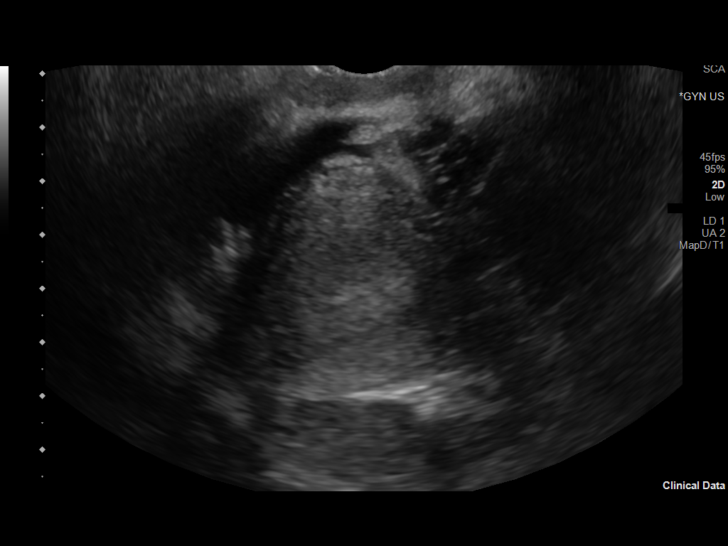
[im 24/36]
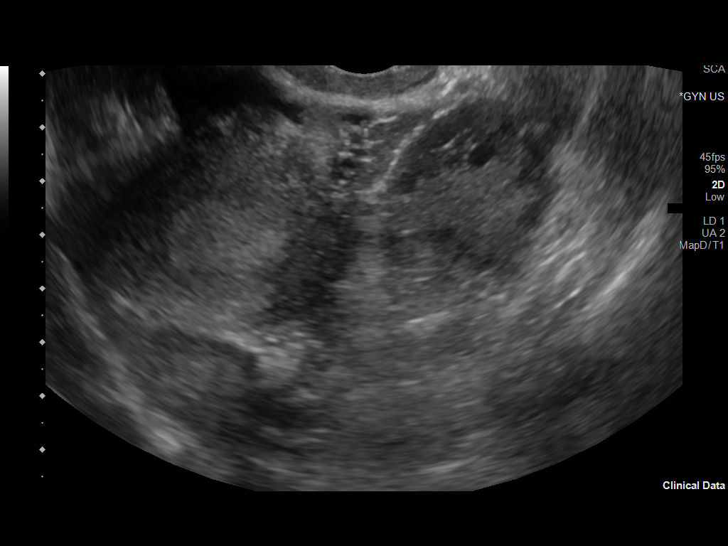
[im 27/36]
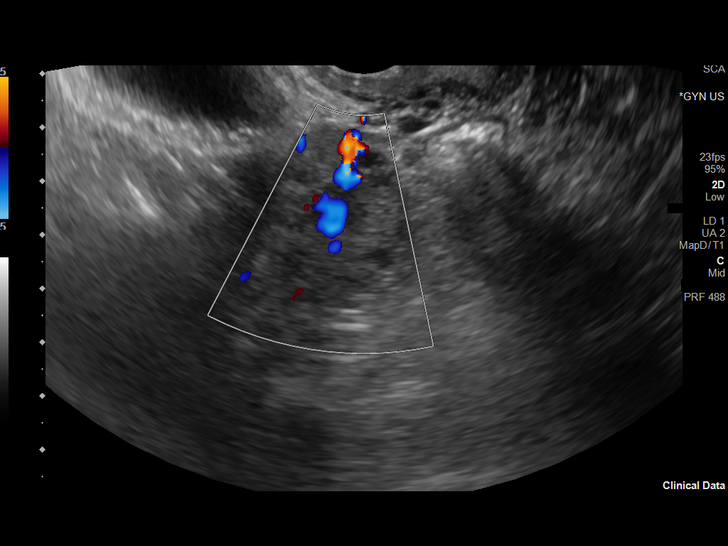
[im 30/36]
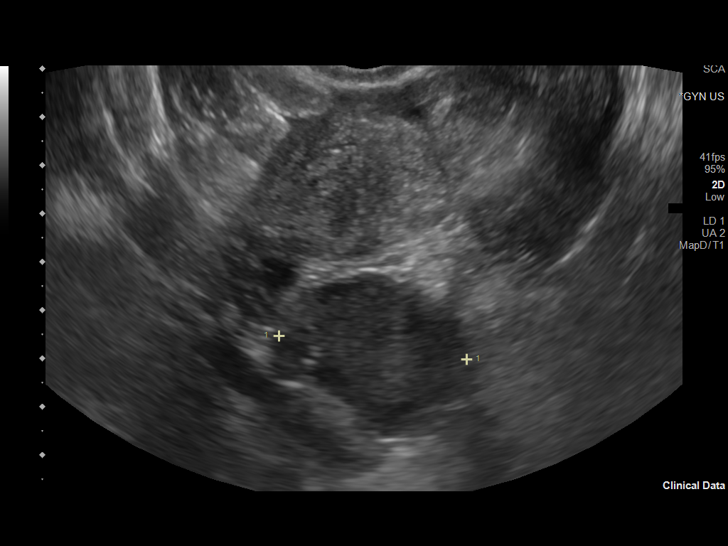
[im 33/36]
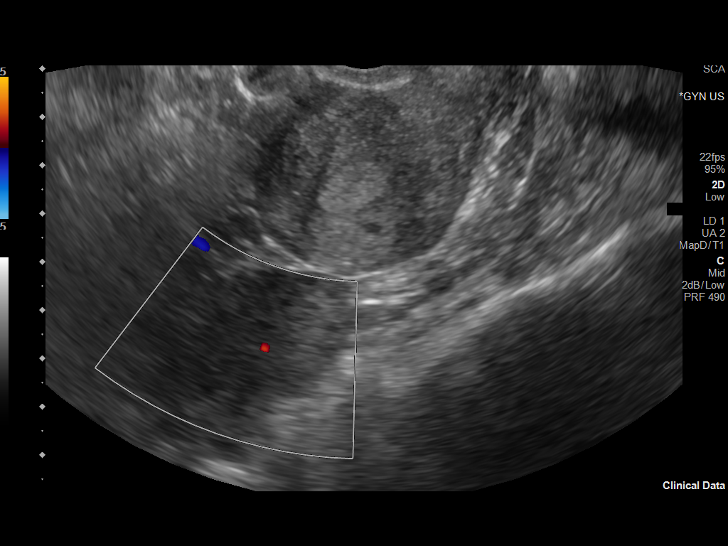
[im 36/36]
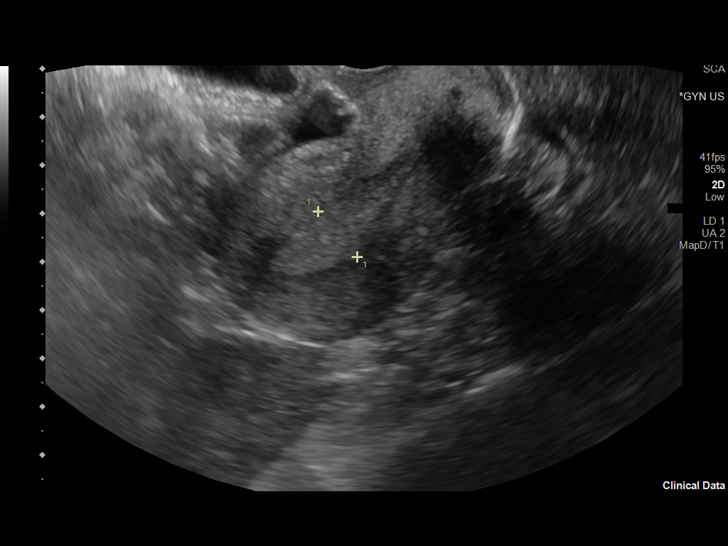

[13 of 25 positions shown; findings below may reference images not displayed]

FINDINGS: Uterus

Measurements: 7.6 x 3.8 x 4.5 cm = volume: 67.6 mL. No fibroids or
other mass visualized.

Endometrium

Thickness: 14 mm, within normal limits for secretory phase. No
focal abnormality visualized.

Right ovary

Measurements: 3.7 x 3.6 x 2.4 cm = volume: 16.9 mL. Normal
appearance/no adnexal mass.

Left ovary

Measurements: 3.8 x 3.7 x 3.1 cm = volume: 22.8 mL. Lobular,
echogenic structure within the left ovarian parenchyma measuring
x 1.3 x 2.5 cm. No internal color flow or significant posterior
shadowing.

Other findings

Trace anechoic free fluid in the pelvis, within physiologic normal.
IMPRESSION: Echogenic 1.9 cm focus in the left ovary, could reflect an
involuting corpus luteum/corpus albicans in the absence of color
flow or posterior shadowing to suggest or worrisome lesion or
dermoid. Hemorrhagic cyst is possible is considered less likely as
these are typically hypoechoic. Could consider 6-8 week follow-up to
assess for resolution.

## 2021-07-09 ENCOUNTER — Other Ambulatory Visit: Payer: Self-pay | Admitting: Family Medicine

## 2021-07-09 DIAGNOSIS — I10 Essential (primary) hypertension: Secondary | ICD-10-CM

## 2021-08-08 ENCOUNTER — Other Ambulatory Visit: Payer: Self-pay | Admitting: Family Medicine

## 2021-08-08 DIAGNOSIS — I10 Essential (primary) hypertension: Secondary | ICD-10-CM

## 2021-08-09 MED ORDER — METOPROLOL SUCCINATE ER 50 MG PO TB24: 50.0000 mg | ORAL_TABLET | Freq: Every day | ORAL | 0 refills | Status: DC

## 2021-08-26 ENCOUNTER — Ambulatory Visit: Payer: BC Managed Care – PPO | Admitting: Family Medicine

## 2021-08-31 ENCOUNTER — Ambulatory Visit (INDEPENDENT_AMBULATORY_CARE_PROVIDER_SITE_OTHER): Payer: BC Managed Care – PPO | Admitting: Internal Medicine

## 2021-08-31 ENCOUNTER — Encounter: Payer: Self-pay | Admitting: Internal Medicine

## 2021-08-31 ENCOUNTER — Other Ambulatory Visit: Payer: Self-pay | Admitting: *Deleted

## 2021-08-31 ENCOUNTER — Other Ambulatory Visit: Payer: Self-pay

## 2021-08-31 VITALS — BP 112/76 | HR 99 | Temp 98.2°F | Ht 64.0 in | Wt 264.4 lb

## 2021-08-31 DIAGNOSIS — J4540 Moderate persistent asthma, uncomplicated: Secondary | ICD-10-CM | POA: Diagnosis not present

## 2021-08-31 DIAGNOSIS — K219 Gastro-esophageal reflux disease without esophagitis: Secondary | ICD-10-CM | POA: Diagnosis not present

## 2021-08-31 DIAGNOSIS — F17210 Nicotine dependence, cigarettes, uncomplicated: Secondary | ICD-10-CM | POA: Diagnosis not present

## 2021-08-31 DIAGNOSIS — Z720 Tobacco use: Secondary | ICD-10-CM

## 2021-08-31 LAB — NITRIC OXIDE: Nitric Oxide: 10

## 2021-08-31 LAB — POCT EXHALED NITRIC OXIDE: FeNO level (ppb): 10

## 2021-08-31 MED ORDER — ADVAIR HFA 230-21 MCG/ACT IN AERO: 2.0000 | INHALATION_SPRAY | Freq: Two times a day (BID) | RESPIRATORY_TRACT | 5 refills | Status: DC

## 2021-08-31 MED ORDER — PANTOPRAZOLE SODIUM 40 MG PO TBEC: 40.0000 mg | DELAYED_RELEASE_TABLET | Freq: Two times a day (BID) | ORAL | 3 refills | Status: DC

## 2021-08-31 NOTE — Progress Notes (Signed)
The patient has been prescribed the inhaler albuterol and Qvar. Inhaler technique was demonstrated to patient. The patient subsequently demonstrated correct technique.

## 2021-08-31 NOTE — Patient Instructions (Addendum)
Please schedule follow up scheduled with myself in 1 months.  If my schedule is not open yet, we will contact you with a reminder closer to that time. Please call [phone] if you haven't heard from [place] a month before.   Take the albuterol rescue inhaler every 4 to 6 hours as needed for wheezing or shortness of breath. You can also take it 15 minutes before exercise or exertional activity. Side effects include heart racing or pounding, jitters or anxiety. If you have a history of an irregular heart rhythm, it can make this worse. Can also give some patients a hard time sleeping.  Start taking your Qvar 1 puff twice a day. Gargle after use.   Take claritin every day.   Follow up with allergy.  Increase acid reflux  medicine to twice a day.  Try sleeping with a wedge pillow at night to see if that helps nighttime asthma symptoms.   To inhale the aerosol using an inhaler, follow these steps:  Remove the protective dust cap from the end of the mouthpiece. If the dust cap was not placed on the mouthpiece, check the mouthpiece for dirt or other objects. Be sure that the canister is fully and firmly inserted in the mouthpiece. 2. If you are using the inhaler for the first time or if you have not used the inhaler in more than 14 days, you will need to prime it. You may also need to prime the inhaler if it has been dropped. Ask your pharmacist or check the manufacturer's information if this happens. To prime the inhaler, shake it well and then press down on the canister 4 times to release 4 sprays into the air, away from your face. Be careful not to get albuterol in your eyes. 3. Shake the inhaler well. 4. Breathe out as completely as possible through your mouth. 4. Hold the canister with the mouthpiece on the bottom, facing you and the canister pointing upward. Place the open end of the mouthpiece into your mouth. Close your lips tightly around the mouthpiece. 6. Breathe in slowly and deeply  through the mouthpiece.At the same time, press down once on the container to spray the medication into your mouth. 7. Try to hold your breath for 10 seconds. remove the inhaler, and breathe out slowly. 8. If you were told to use 2 puffs, wait 1 minute and then repeat steps 3-7. 9. Replace the protective cap on the inhaler. 10. Clean your inhaler regularly. Follow the manufacturer's directions carefully and ask your doctor or pharmacist if you have any questions about cleaning your inhaler.  Check the back of the inhaler to keep track of the total number of doses left on the inhaler.    By learning about asthma and how it can be controlled, you take an important step toward managing this disease. Work closely with your asthma care team to learn all you can about your asthma, how to avoid triggers, what your medications do, and how to take them correctly. With proper care, you can live free of asthma symptoms and maintain a normal, healthy lifestyle.   What is asthma? Asthma is a chronic disease that affects the airways of the lungs. During normal breathing, the bands of muscle that surround the airways are relaxed and air moves freely. During an asthma episode or "attack," there are three main changes that stop air from moving easily through the airways: The bands of muscle that surround the airways tighten and make the airways  narrow. This tightening is called bronchospasm.  The lining of the airways becomes swollen or inflamed.  The cells that line the airways produce more mucus, which is thicker than normal and clogs the airways.  These three factors - bronchospasm, inflammation, and mucus production - cause symptoms such as difficulty breathing, wheezing, and coughing.  What are the most common symptoms of asthma? Asthma symptoms are not the same for everyone. They can even change from episode to episode in the same person. Also, you may have only one symptom of asthma, such as cough, but  another person may have all the symptoms of asthma. It is important to know all the symptoms of asthma and to be aware that your asthma can present in any of these ways at any time. The most common symptoms include: Coughing, especially at night  Shortness of breath  Wheezing  Chest tightness, pain, or pressure   Who is affected by asthma? Asthma affects 22 million Americans; about 6 million of these are children under age 35. People who have a family history of asthma have an increased risk of developing the disease. Asthma is also more common in people who have allergies or who are exposed to tobacco smoke. However, anyone can develop asthma at any time. Some people may have asthma all of their lives, while others may develop it as adults.  What causes asthma? The airways in a person with asthma are very sensitive and react to many things, or "triggers." Contact with these triggers causes asthma symptoms. One of the most important parts of asthma control is to identify your triggers and then avoid them when possible. The only trigger you do not want to avoid is exercise. Pre-treatment with medicines before exercise can allow you to stay active yet avoid asthma symptoms. Common asthma triggers include: Infections (colds, viruses, flu, sinus infections)  Exercise  Weather (changes in temperature and/or humidity, cold air)  Tobacco smoke  Allergens (dust mites, pollens, pets, mold spores, cockroaches, and sometimes foods)  Irritants (strong odors from cleaning products, perfume, wood smoke, air pollution)  Strong emotions such as crying or laughing hard  Some medications   How is asthma diagnosed? To diagnose asthma, your doctor will first review your medical history, family history, and symptoms. Your doctor will want to know any past history of breathing problems you may have had, as well as a family history of asthma, allergies, eczema (a bumpy, itchy skin rash caused by allergies), or other  lung disease. It is important that you describe your symptoms in detail (cough, wheeze, shortness of breath, chest tightness), including when and how often they occur. The doctor will perform a physical examination and listen to your heart and lungs. He or she may also order breathing tests, allergy tests, blood tests, and chest and sinus X-rays. The tests will find out if you do have asthma and if there are any other conditions that are contributing factors.  How is asthma treated? Asthma can be controlled, but not cured. It is not normal to have frequent symptoms, trouble sleeping, or trouble completing tasks. Appropriate asthma care will prevent symptoms and visits to the emergency room and hospital. Asthma medicines are one of the mainstays of asthma treatment. The drugs used to treat asthma are explained below.  Anti-inflammatories: These are the most important drugs for most people with asthma. Anti-inflammatory drugs reduce swelling and mucus production in the airways. As a result, airways are less sensitive and less likely to react to triggers.  These medications need to be taken daily and may need to be taken for several weeks before they begin to control asthma. Anti-inflammatory medicines lead to fewer symptoms, better airflow, less sensitive airways, less airway damage, and fewer asthma attacks. If taken every day, they CONTROL or prevent asthma symptoms.   Bronchodilators: These drugs relax the muscle bands that tighten around the airways. This action opens the airways, letting more air in and out of the lungs and improving breathing. Bronchodilators also help clear mucus from the lungs. As the airways open, the mucus moves more freely and can be coughed out more easily. In short-acting forms, bronchodilators RELIEVE or stop asthma symptoms by quickly opening the airways and are very helpful during an asthma episode. In long-acting forms, bronchodilators provide CONTROL of asthma symptoms and  prevent asthma episodes.  Asthma drugs can be taken in a variety of ways. Inhaling the medications by using a metered dose inhaler, dry powder inhaler, or nebulizer is one way of taking asthma medicines. Oral medicines (pills or liquids you swallow) may also be prescribed.  Asthma severity Asthma is classified as either "intermittent" (comes and goes) or "persistent" (lasting). Persistent asthma is further described as being mild, moderate, or severe. The severity of asthma is based on how often you have symptoms both during the day and night, as well as by the results of lung function tests and by how well you can perform activities. The "severity" of asthma refers to how "intense" or "strong" your asthma is.  Asthma control Asthma control is the goal of asthma treatment. Regardless of your asthma severity, it may or may not be controlled. Asthma control means: You are able to do everything you want to do at work and home  You have no (or minimal) asthma symptoms  You do not wake up from your sleep or earlier than usual in the morning due to asthma  You rarely need to use your reliever medicine (inhaler)  Another major part of your treatment is that you are happy with your asthma care and believe your asthma is controlled.  Monitoring symptoms A key part of treatment is keeping track of how well your lungs are working. Monitoring your symptoms  what they are, how and when they happen, and how severe they are  is an important part of being able to control your asthma.  Sometimes asthma is monitored using a peak flow meter. A peak flow (PF) meter measures how fast the air comes out of your lungs. It can help you know when your asthma is getting worse, sometimes even before you have symptoms. By taking daily peak flow readings, you can learn when to adjust medications to keep asthma under good control. It is also used to create your asthma action plan (see below). Your doctor can use your peak  flow readings to adjust your treatment plan in some cases.  Asthma Action Plan Based on your history and asthma severity, you and your doctor will develop a care plan called an asthma action plan. The asthma action plan describes when and how to use your medicines, actions to take when asthma worsens, and when to seek emergency care. Make sure you understand this plan. If you do not, ask your asthma care provider any questions you may have. Your asthma action plan is one of the keys to controlling asthma. Keep it readily available to remind you of what you need to do every day to control asthma and what you need to do  when symptoms occur.  Goals of asthma therapy These are the goals of asthma treatment: Live an active, normal life  Prevent chronic and troublesome symptoms  Attend work or school every day  Perform daily activities without difficulty  Stop urgent visits to the doctor, emergency department, or hospital  Use and adjust medications to control asthma with few or no side effects

## 2021-08-31 NOTE — Progress Notes (Signed)
Sherry Villegas    [id number]    01-Oct-1985  Primary Care Physician:Lowne Almeta Monas Grayling Congress, DO  Referring Physician: Zola Button, [place]ling Congress, DO [address] [place],  [place] [address] Reason for Consultation: asthma Date of Consultation: 08/31/2021  Chief complaint:   Chief Complaint  Patient presents with   Consult    Pt states she was diagnosed with asthma around 2011.     HPI: Sherry Villegas is a 36 y.o. woman who presents for new evaluation of asthma. Diagnosed in 2011. Move here from [place] in 2015. Was seen by Dr Sherene Sires in 2016 for asthma.   Childhood allergies and nose bleeds. No bronchitis or pneumonia.   Here with progressing dyspnea on exertion. She is scared to work out because of these symptoms.  Sometimes has itching prior to dyspnea, chest tightness. Has also broken out in hives. Notes a rattle in her chest. She has had hives with dulera many years ago and also has some foods which give her hives.   Start a few months ago by PCP on Qvar. Takes this as needed. She is unclear how to take her medications  Has gained about 60 lbs in the last 6 years and would like to get back into a regular exercise routine but is scared.   Current Regimen: Qvar(once in the last week), albuterol  Asthma Triggers: dust, exertion Exacerbations in the last year: History of hospitalization or intubation: never Allergy Testing: never had.  GERD:  yes, on protonix and famotidine. Has had gastritis and H. Pylori.  Allergic Rhinitis: yes, has post nasal drainage, takes claritin. As needed.  ACT:  Asthma Control Test ACT Total Score  08/31/2021 20   FeNO: obtained 08/31/21 - 10 ppb   Social history:  Occupation: Clinical biochemist, works from home.  Exposures: lives with daughter, boyfriend and daughter. Pet dog.  Smoking history: 15 years x 0.25 packs.   Social History   Occupational History   Occupation: Clinical biochemist rep    Occupation: Customer service  rep  Tobacco Use   Smoking status: Every Day    Packs/day: 0.25    Years: 12.00    Pack years: 3.00    Types: Cigarettes   Smokeless tobacco: Never   Tobacco comments:    Currently smoking about 5cigs per day as of 08/31/21 ep  Vaping Use   Vaping Use: Never used  Substance and Sexual Activity   Alcohol use: Yes    Alcohol/week: 6.0 standard drinks    Types: 6 Shots of liquor per week   Drug use: Yes    Types: Marijuana    Comment: special occassions only-- 1 x q64months   Sexual activity: Yes    Partners: Male    Birth control/protection: None    Relevant family history:  Family History  Problem Relation Age of Onset   Heart failure Mother    Hypertension Mother    Heart disease Mother 36       MI   Stroke Mother    Sudden death Mother    Cancer Father    Heart failure Father    Stroke Father    Hypertension Father    Diabetes Father    Breast cancer Maternal Grandmother    Breast cancer Paternal Grandmother    Breast cancer Maternal Aunt     Past Medical History:  Diagnosis Date   Anemia    Anxiety    Asthma  Back pain    Constipation    GERD (gastroesophageal reflux disease)    Hyperlipidemia    Hypertension    Migraine    Stomach ulcer    Vaginal Pap smear, abnormal     History reviewed. No pertinent surgical history.   Physical Exam: Blood pressure 112/76, pulse 99, temperature 98.2 F (36.8 C), temperature source Oral, height 5\' 4"  (1.626 m), weight 264 lb 6.4 oz (119.9 kg), SpO2 100 %. Gen:      No acute distress ENT:  mallampati IV, no nasal polyps, mucus membranes moist Lungs:    No increased respiratory effort, symmetric chest wall excursion, clear to auscultation bilaterally, no wheezes or crackles CV:         Regular rate and rhythm; no murmurs, rubs, or gallops.  No pedal edema Abd:      + bowel sounds; soft, non-tender; no distension MSK: no acute synovitis of DIP or PIP joints, no mechanics hands.  Skin:      Warm and dry; no  rashes Neuro: normal speech, no focal facial asymmetry Psych: alert and oriented x3, normal mood and affect   Data Reviewed/Medical Decision Making:  Independent interpretation of tests: Imaging:  Review of patient's chest xray June 2022  images revealed no acute process, maybe some mild peribronchial cuffing. The patient's images have been independently reviewed by me.    PFTs: I have personally reviewed the patient's PFTs and spirometry obtained today shows no airflow limitation.  No flowsheet data found.  Labs:  Lab Results  Component Value Date   WBC 7.1 03/16/2021   HGB 10.1 (L) 03/16/2021   HCT 34.5 03/16/2021   MCV 73 (L) 03/16/2021   PLT 406.0 (H) 06/30/2020   Lab Results  Component Value Date   NA 139 03/16/2021   K 3.9 03/16/2021   CL 101 03/16/2021   CO2 21 03/16/2021  No peripheral eosinophilia on cbc with diff    Immunization status:  Immunization History  Administered Date(s) Administered   Tdap 05/10/2019     I reviewed prior external note(s) from dr 05/12/2019, Sherene Sires, family medicine  I reviewed the result(s) of the labs and imaging as noted above.   I have ordered pft  Assessment:  Moderate persistent asthma Tobacco use disorder Urticaria, pruritis GERD with gastritis  Plan/Recommendations: Quit smoking! She is precontemplative.  Continue prn albuterol Start taking qvar 1 puff twice a day, gargle after use.  Increase pantoprazole to BID to improve gerd control Follow up with allergy about urticaria.   Smoking Cessation Counseling:  1. The patient is an everyday smoker and symptomatic due to the following condition asthma 2. The patient is currently precontemplative in quitting smoking. 3. I advised patient to quit smoking. 4. We identified patient specific barriers to change.  5. I personally spent 3 minutes counseling the patient regarding tobacco use disorder. 6. We discussed management of stress and anxiety to help with smoking cessation,  when applicable. 7. We discussed nicotine replacement therapy, Wellbutrin, Chantix as possible options. 8. I advised setting a quit date. 9. Follow?up arranged with our office to continue ongoing discussions. 10.Resources given to patient including quit hotline.    We discussed disease management and progression at length today.   Return to Care: Return in about 4 weeks (around 09/28/2021).  09/30/2021, MD Pulmonary and Critical Care Medicine [facility] Office:[phone]  CC: [facility], Zola Button, *

## 2021-09-07 ENCOUNTER — Telehealth: Payer: Self-pay | Admitting: Internal Medicine

## 2021-09-08 MED ORDER — ALBUTEROL SULFATE HFA 108 (90 BASE) MCG/ACT IN AERS: 2.0000 | INHALATION_SPRAY | Freq: Four times a day (QID) | RESPIRATORY_TRACT | 2 refills | Status: DC | PRN

## 2021-09-08 NOTE — Telephone Encounter (Signed)
Called and spoke with patient and verified what pharmacy she would like Albuterol sent to. Nothing further needed  ?

## 2021-09-28 NOTE — Progress Notes (Signed)
? ?New Patient Note ? ?RE: Sherry Villegas MRN: [id number] DOB: Dec 30, 1985 ?Date of Office Visit: 09/29/2021 ? ?Consult requested by: Ann Held, * ?Primary care provider: Ann Held, DO ? ?Chief Complaint: Asthma (Pt is present today due to asthma, which she's been Dx with for over 10 yrs, and she see's a pulmonologist. Symptoms she's experiencing are severe wheezing, itching, sneezing cough and SOB during physical activities like exercising.), Cough, Wheezing, Shortness of Breath, and Pruritus ? ?History of Present Illness: ?I had the pleasure of seeing Sherry Villegas for initial evaluation at the [facility] on 09/29/2021. She is a 36 y.o. female, who is referred here by Ann Held, DO for the evaluation of urticaria and asthma.  ? ?Rash:  ?Rash started about August 2022. Patient was in the car and noted hives on her face and top lip. ?She took benadryl which helped. ? ?Describes them as itchy, red. Rash resolved with benadryl within the same day. Associated symptoms include: none.  ?Frequency of episodes: this only occurred once. ?Suspected triggers are unknown. Denies any fevers, chills, changes in medications, foods, personal care products or recent infections. She has tried the following therapies: benadryl with good benefit. Systemic steroids no.  ?Previous work up includes: none. ?Previous history of rash/hives: at age 67 with no triggers noted, Dulera caused hives.  ?Patient is up to date with the following cancer screening tests: physical exam, pap smears. ? ?Asthma:  ?Patient follows with pulmonology.  ?She reports symptoms of chest tightness, shortness of breath, coughing, wheezing for 10+ years but worsening lately. Current medications include ventolin prn and Qvar 43mcg 1 puff BID with unknown benefit.  She reports not using aerochamber with inhalers. She tried the following inhalers: Dulera - caused hives. Main triggers are exercise. Gets shortness of  breath with minimal exertion and resolves within a few minutes.  ? ?In the last month, frequency of symptoms: depends. Frequency of SABA use: depends. Interference with physical activity: yes. Sleep is undisturbed. In the last 12 months, emergency room visits/urgent care visits/doctor office visits or hospitalizations due to respiratory issues: no. In the last 12 months, oral steroids courses: one. Lifetime history of hospitalization for respiratory issues: no. Prior intubations: no. History of pneumonia: no. She was evaluated by pulmonologist in the past. Smoking exposure: currently smoking. Up to date with flu vaccine: no. Up to date with COVID-19 vaccine: no. Prior Covid-19 infection: no. ?History of reflux: currently takes pantoprazole 40mg  BID and famotidine prn. Follows with GI - treated for H pylori, gastritis and ulcer.  ? ?Assessment and Plan: ?[name] is a 36 y.o. female with: ?Rash and other nonspecific skin eruption ?1 episode of rash outbreak in August 2022 with no triggers noted.  Resolved with Benadryl.  Denies any changes in diet, medication, personal or recent infections around that time. ?Not sure what caused the rash. ?If it happens again, take pictures and let us know. ?Write down what you come across that day or eaten that day. ?No additional work up needed at this time. ? ?Moderate persistent asthma without complication ?Following with pulmonology and was diagnosed with asthma 10+ years ago.  Concerned as lately she noted worsening shortness of breath with minimal exertion.  Recently started back on Qvar 40 mcg 1 puff twice a day and albuterol prior to exertion with unknown benefit.  Dulera caused hives. ?Today's spirometry was normal. ?Today's skin testing showed: Positive to grass, dust mites and cockroach. Negative to  common foods.  ?Follow up with pulmonology and their recommendations. ?Discussed that some of her DOE symptoms may be due to physical deconditioning.  ?Daily controller  medication(s): Qvar 30mcg 1 puff twice a day and rinse mouth after each use.  ?May use albuterol rescue inhaler 2 puffs or nebulizer every 4 to 6 hours as needed for shortness of breath, chest tightness, coughing, and wheezing. May use albuterol rescue inhaler 2 puffs 5 to 15 minutes prior to strenuous physical activities. Monitor frequency of use.  ? ?Other allergic rhinitis ?Perennial rhinitis symptoms and taking over-the-counter antihistamines with good benefit.  No prior allergy testing. ?Today's skin testing showed: Positive to grass, dust mites and cockroach. ?Start environmental control measures as below. ?Use over the counter antihistamines such as Zyrtec (cetirizine), Claritin (loratadine), Allegra (fexofenadine), or Xyzal (levocetirizine) daily as needed. May take twice a day during allergy flares. May switch antihistamines every few months. ?Start Singulair (montelukast) 10mg  daily at night. This may also help your asthma.  ?Cautioned that in some children/adults can experience behavioral changes including hyperactivity, agitation, depression, sleep disturbances and suicidal ideations. These side effects are rare, but if you notice them you should notify me and discontinue Singulair (montelukast). ?Use saline nasal spray as needed.  ? ?Gastroesophageal reflux disease ?Follows with GI and was treated for H. pylori, gastritis and ulcer. ?See handout for lifestyle and dietary modifications. ?Continue Protonix 40mg  twice a day as prescribed. ?Continue Pepcid 20mg  as prescribed. ?Follow up with GI regarding this.  ? ?Return in about 3 months (around 12/30/2021). ? ?Meds ordered this encounter  ?Medications  ? montelukast (SINGULAIR) 10 MG tablet  ?  Sig: Take 1 tablet (10 mg total) by mouth at bedtime.  ?  Dispense:  30 tablet  ?  Refill:  5  ? ?Lab Orders  ?No laboratory test(s) ordered today  ? ? ?Other allergy screening: ?Rhino conjunctivitis:  sneezing, rhinorrhea and takes OTC antihistamines with good  benefit. ?Currently not on nasal sprays - used to get epistaxis. ?No prior allergy testing or AIT. ? ?Food allergy: no ?Medication allergy: yes ?Hymenoptera allergy: no ?History of recurrent infections suggestive of immunodeficency: no ? ?Diagnostics: ?Spirometry:  ?Tracings reviewed. Her effort: Good reproducible efforts. ?FVC: 2.83L ?FEV1: 2.33L, 85% predicted ?FEV1/FVC ratio: 82% ?Interpretation: Spirometry consistent with normal pattern.  ?Please see scanned spirometry results for details. ? ?Skin Testing: Environmental allergy panel and select foods. ?Positive to grass, dust mites and cockroach. ?Negative to common foods.  ?Results discussed with patient/family. ? Airborne Adult Perc - 09/29/21 1600   ? ? Time Antigen Placed 1600   ? Allergen Manufacturer Lavella Hammock   ? Location Back   ? Number of Test 59   ? 1. Control-Buffer 50% Glycerol Negative   ? 2. Control-Histamine 1 mg/ml 2+   ? 3. Albumin saline Negative   ? 4. Metamora Negative   ? 5. [place] Negative   ? 6. Johnson Negative   ? 7. Rosedale Blue Negative   ? 8. Meadow Fescue Negative   ? 9. Perennial Rye Negative   ? 10. Sweet Vernal Negative   ? 11. Timothy Negative   ? 12. Cocklebur Negative   ? 13. Burweed Marshelder Negative   ? 14. Ragweed, short Negative   ? 15. Ragweed, Giant Negative   ? 16. Plantain,  English Negative   ? 17. Lamb's Quarters Negative   ? 18. Sheep Sorrell Negative   ? 19. Rough Pigweed Negative   ? 20. Marsh Elder, Rough Negative   ?  21. Mugwort, Common Negative   ? 22. Ash mix Negative   ? 23. Wendee Copp mix Negative   ? 24. Beech American Negative   ? 25. Box, Elder Negative   ? 26. Cedar, red Negative   ? 27. Cottonwood, [place] Negative   ? 28. Elm mix Negative   ? 29. Hickory Negative   ? 30. Maple mix Negative   ? 31. Oak, [place] mix Negative   ? 32. Pecan Pollen Negative   ? 33. Pine mix Negative   ? 34. Sycamore Eastern Negative   ? 35. Walnut, Black Pollen Negative   ? 36. Alternaria alternata Negative   ? 15. Cladosporium  Herbarum Negative   ? 38. Aspergillus mix Negative   ? 39. Penicillium mix Negative   ? 40. Bipolaris sorokiniana (Helminthosporium) Negative   ? 41. Drechslera spicifera (Curvularia) Negative   ? 42. Mucor plumbeus

## 2021-09-29 ENCOUNTER — Ambulatory Visit (INDEPENDENT_AMBULATORY_CARE_PROVIDER_SITE_OTHER): Payer: BC Managed Care – PPO | Admitting: Allergy

## 2021-09-29 ENCOUNTER — Encounter: Payer: Self-pay | Admitting: Allergy

## 2021-09-29 ENCOUNTER — Other Ambulatory Visit: Payer: Self-pay

## 2021-09-29 VITALS — BP 122/80 | HR 93 | Temp 98.0°F | Wt 251.4 lb

## 2021-09-29 DIAGNOSIS — J4540 Moderate persistent asthma, uncomplicated: Secondary | ICD-10-CM

## 2021-09-29 DIAGNOSIS — R21 Rash and other nonspecific skin eruption: Secondary | ICD-10-CM | POA: Diagnosis not present

## 2021-09-29 DIAGNOSIS — K219 Gastro-esophageal reflux disease without esophagitis: Secondary | ICD-10-CM | POA: Diagnosis not present

## 2021-09-29 DIAGNOSIS — J3089 Other allergic rhinitis: Secondary | ICD-10-CM

## 2021-09-29 MED ORDER — MONTELUKAST SODIUM 10 MG PO TABS: 10.0000 mg | ORAL_TABLET | Freq: Every day | ORAL | 5 refills | Status: DC

## 2021-09-29 NOTE — Assessment & Plan Note (Signed)
Follows with GI and was treated for H. pylori, gastritis and ulcer. ?? See handout for lifestyle and dietary modifications. ?? Continue Protonix 40mg  twice a day as prescribed. ?? Continue Pepcid 20mg  as prescribed. ?? Follow up with GI regarding this.  ?

## 2021-09-29 NOTE — Patient Instructions (Addendum)
Today's skin testing showed: ?Positive to grass, dust mites and cockroach. ?Negative to common foods.  ? ?Results given. ? ?Environmental allergies ?Start environmental control measures as below. ?Use over the counter antihistamines such as Zyrtec (cetirizine), Claritin (loratadine), Allegra (fexofenadine), or Xyzal (levocetirizine) daily as needed. May take twice a day during allergy flares. May switch antihistamines every few months. ?Start Singulair (montelukast) 10mg  daily at night. This may also help your asthma.  ?Cautioned that in some children/adults can experience behavioral changes including hyperactivity, agitation, depression, sleep disturbances and suicidal ideations. These side effects are rare, but if you notice them you should notify me and discontinue Singulair (montelukast). ?Use saline nasal spray as needed.  ? ?Rash ?Not sure what caused the rash. ?If it happens again, take pictures and let us know. ?Write down what you come across that day or eaten that day. ? ?Asthma ?Follow up with pulmonology and their recommendations. ?Daily controller medication(s): Qvar 11mcg 1 puff twice a day and rinse mouth after each use.  ?May use albuterol rescue inhaler 2 puffs or nebulizer every 4 to 6 hours as needed for shortness of breath, chest tightness, coughing, and wheezing. May use albuterol rescue inhaler 2 puffs 5 to 15 minutes prior to strenuous physical activities. Monitor frequency of use.  ?Asthma control goals:  ?Full participation in all desired activities (may need albuterol before activity) ?Albuterol use two times or less a week on average (not counting use with activity) ?Cough interfering with sleep two times or less a month ?Oral steroids no more than once a year ?No hospitalizations  ? ?Heartburn: ?See handout for lifestyle and dietary modifications. ?Continue Protonix 40mg  twice a day as prescribed. ?Continue Pepcid 20mg  as prescribed. ?Follow up with GI regarding this.  ? ?Follow up in 3  months or sooner if needed.   ? ?Reducing Pollen Exposure ?Pollen seasons: trees (spring), grass (summer) and ragweed/weeds (fall). ?Keep windows closed in your home and car to lower pollen exposure.  ?Install air conditioning in the bedroom and throughout the house if possible.  ?Avoid going out in dry windy days - especially early morning. ?Pollen counts are highest between 5 - 10 AM and on dry, hot and windy days.  ?Save outside activities for late afternoon or after a heavy rain, when pollen levels are lower.  ?Avoid mowing of grass if you have grass pollen allergy. ?Be aware that pollen can also be transported indoors on people and pets.  ?Dry your clothes in an automatic dryer rather than hanging them outside where they might collect pollen.  ?Rinse hair and eyes before bedtime. ?Control of House Dust Mite Allergen ?Dust mite allergens are a common trigger of allergy and asthma symptoms. While they can be found throughout the house, these microscopic creatures thrive in warm, humid environments such as bedding, upholstered furniture and carpeting. ?Because so much time is spent in the bedroom, it is essential to reduce mite levels there.  ?Encase pillows, mattresses, and box springs in special allergen-proof fabric covers or airtight, zippered plastic covers.  ?Bedding should be washed weekly in hot water (130? F) and dried in a hot dryer. Allergen-proof covers are available for comforters and pillows that can?t be regularly washed.  ?Wash the allergy-proof covers every few months. Minimize clutter in the bedroom. Keep pets out of the bedroom.  ?Keep humidity less than 50% by using a dehumidifier or air conditioning. You can buy a humidity measuring device called a hygrometer to monitor this.  ?If possible, replace carpets  with hardwood, linoleum, or washable area rugs. If that's not possible, vacuum frequently with a vacuum that has a HEPA filter. ?Remove all upholstered furniture and non-washable window  drapes from the bedroom. ?Remove all non-washable stuffed toys from the bedroom.  Wash stuffed toys weekly. ?Cockroach Allergen Avoidance ?Cockroaches are often found in the homes of densely populated urban areas, schools or commercial buildings, but these creatures can lurk almost anywhere. This does not mean that you have a dirty house or living area. ?Block all areas where roaches can enter the home. This includes crevices, wall cracks and windows.  ?Cockroaches need water to survive, so fix and seal all leaky faucets and pipes. Have an exterminator go through the house when your family and pets are gone to eliminate any remaining roaches. ?Keep food in lidded containers and put pet food dishes away after your pets are done eating. Vacuum and sweep the floor after meals, and take out garbage and recyclables. Use lidded garbage containers in the kitchen. Wash dishes immediately after use and clean under stoves, refrigerators or toasters where crumbs can accumulate. Wipe off the stove and other kitchen surfaces and cupboards regularly. ? ?

## 2021-09-29 NOTE — Assessment & Plan Note (Signed)
Following with pulmonology and was diagnosed with asthma 10+ years ago.  Concerned as lately she noted worsening shortness of breath with minimal exertion.  Recently started back on Qvar 40 mcg 1 puff twice a day and albuterol prior to exertion with unknown benefit.  Dulera caused hives. ?? Today's spirometry was normal. ?? Today's skin testing showed: Positive to grass, dust mites and cockroach. Negative to common foods.  ?? Follow up with pulmonology and their recommendations. ?? Discussed that some of her DOE symptoms may be due to physical deconditioning.  ?? Daily controller medication(s): Qvar 1 puff twice a day and rinse mouth after each use.  ?? May use albuterol rescue inhaler 2 puffs or nebulizer every 4 to 6 hours as needed for shortness of breath, chest tightness, coughing, and wheezing. May use albuterol rescue inhaler 2 puffs 5 to 15 minutes prior to strenuous physical activities. Monitor frequency of use.  ?

## 2021-09-29 NOTE — Assessment & Plan Note (Signed)
Perennial rhinitis symptoms and taking over-the-counter antihistamines with good benefit.  No prior allergy testing. ?? Today's skin testing showed: Positive to grass, dust mites and cockroach. ?? Start environmental control measures as below. ?? Use over the counter antihistamines such as Zyrtec (cetirizine), Claritin (loratadine), Allegra (fexofenadine), or Xyzal (levocetirizine) daily as needed. May take twice a day during allergy flares. May switch antihistamines every few months. ?? Start Singulair (montelukast) 10mg  daily at night. This may also help your asthma.  ?? Cautioned that in some children/adults can experience behavioral changes including hyperactivity, agitation, depression, sleep disturbances and suicidal ideations. These side effects are rare, but if you notice them you should notify me and discontinue Singulair (montelukast). ?? Use saline nasal spray as needed.  ?

## 2021-09-29 NOTE — Assessment & Plan Note (Signed)
1 episode of rash outbreak in August 2022 with no triggers noted.  Resolved with Benadryl.  Denies any changes in diet, medication, personal or recent infections around that time. ?? Not sure what caused the rash. ?? If it happens again, take pictures and let us know. ?? Write down what you come across that day or eaten that day. ?? No additional work up needed at this time. ?

## 2021-10-06 ENCOUNTER — Other Ambulatory Visit: Payer: Self-pay

## 2021-10-06 ENCOUNTER — Encounter: Payer: Self-pay | Admitting: Family Medicine

## 2021-10-06 ENCOUNTER — Ambulatory Visit (INDEPENDENT_AMBULATORY_CARE_PROVIDER_SITE_OTHER): Payer: BC Managed Care – PPO | Admitting: Family Medicine

## 2021-10-06 VITALS — BP 128/75 | HR 71 | Wt 255.0 lb

## 2021-10-06 DIAGNOSIS — R1115 Cyclical vomiting syndrome unrelated to migraine: Secondary | ICD-10-CM

## 2021-10-06 DIAGNOSIS — N920 Excessive and frequent menstruation with regular cycle: Secondary | ICD-10-CM

## 2021-10-06 DIAGNOSIS — N979 Female infertility, unspecified: Secondary | ICD-10-CM | POA: Diagnosis not present

## 2021-10-06 MED ORDER — MEGESTROL ACETATE 40 MG PO TABS: 40.0000 mg | ORAL_TABLET | Freq: Every day | ORAL | 5 refills | Status: DC

## 2021-10-06 MED ORDER — PROMETHAZINE HCL 25 MG RE SUPP
25.0000 mg | Freq: Four times a day (QID) | RECTAL | 6 refills | Status: DC | PRN
Start: 2021-10-06 — End: 2022-02-21

## 2021-10-06 NOTE — Progress Notes (Signed)
? ?  Subjective:  ? ? Patient ID: Sherry Villegas, female    DOB: 12-30-1985, 36 y.o.   MRN: [id number] ? ?HPI ? ?Patient seen for follow-up of menorrhagia with regular cycle and catamenial cyclical vomiting syndrome.  She has tried taking Aygestin during her cycles, but she had prolonged bleeding.  She also tried taking the Aygestin just during her cycles, but due to the cyclical vomiting syndrome, she has been unable to keep down the medications.  I tried to give her Lysteda last appointment, she was unable to keep that down.  She normally takes Zofran oral tablets during her menses.  She is able to keep it down during the first day, but is unable to keep it down after that. ? ?She continues to struggle with infertility.  She has been trying to get pregnant over the past several years.  We have ordered an HSG, but she has not had that done yet. ? ?Review of Systems ? ?   ?Objective:  ? Physical Exam ?Vitals reviewed.  ?Constitutional:   ?   Appearance: Normal appearance.  ?Cardiovascular:  ?   Rate and Rhythm: Normal rate.  ?Skin: ?   General: Skin is warm and dry.  ?   Capillary Refill: Capillary refill takes less than 2 seconds.  ?Neurological:  ?   General: No focal deficit present.  ?   Mental Status: She is alert.  ?Psychiatric:     ?   Mood and Affect: Mood normal.     ?   Behavior: Behavior normal.     ?   Thought Content: Thought content normal.  ? ?   ?Assessment & Plan:  ?1. Menorrhagia with regular cycle ?Will suppress her periods for the next few month while waiting for referral to [facility]. Megace 40mg  daily prescribed - start after next menses completed. ? ?2. Cyclical vomiting syndrome ?Will trial phenergan. Discussed that this is a sedating medication. Can add in Zofran ODT if needed. ? ?3. Infertility, female ?Referral to [facility] ?- Ambulatory referral to Infertility ? ?

## 2021-10-12 ENCOUNTER — Telehealth: Payer: Self-pay

## 2021-10-12 NOTE — Telephone Encounter (Signed)
Patient had allergy testing done on 09/29/2021 and where she had her intradermals done and she has some little mosquito bite size left on her arm instructed patient to get some hydrocortisone 1% to apply to those mosquito bite size hives. ?

## 2021-10-19 IMAGING — US US PELVIS COMPLETE WITH TRANSVAGINAL
1 series · 14 of 25 positions shown · non-contrast
Comparison: 11/08/2019

CLINICAL DATA: Uterine mass, heavy vaginal bleeding, LMP 02/16/2020



[Series 1: us pelvis complete with transvaginal · 0.22mm/px · 14 of 136 slices shown]
[im 1/136]
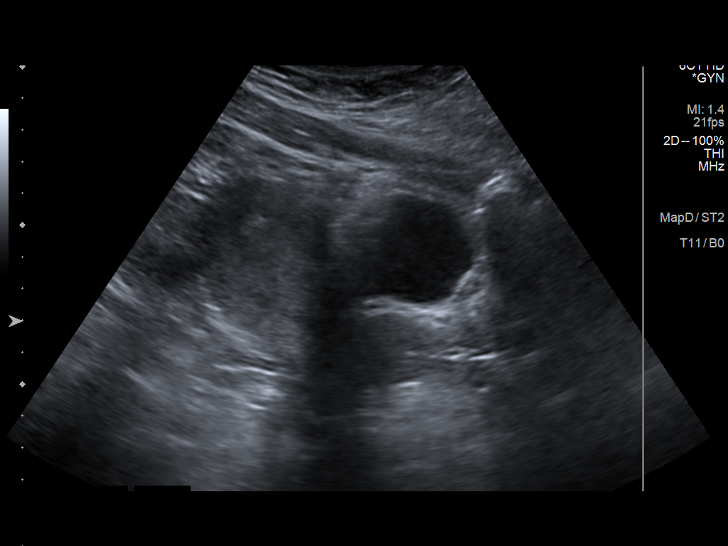
[im 12/136]
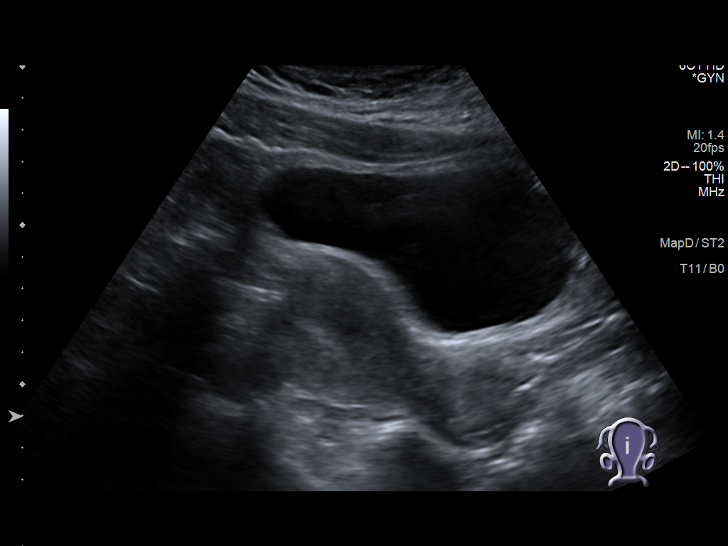
[im 23/136]
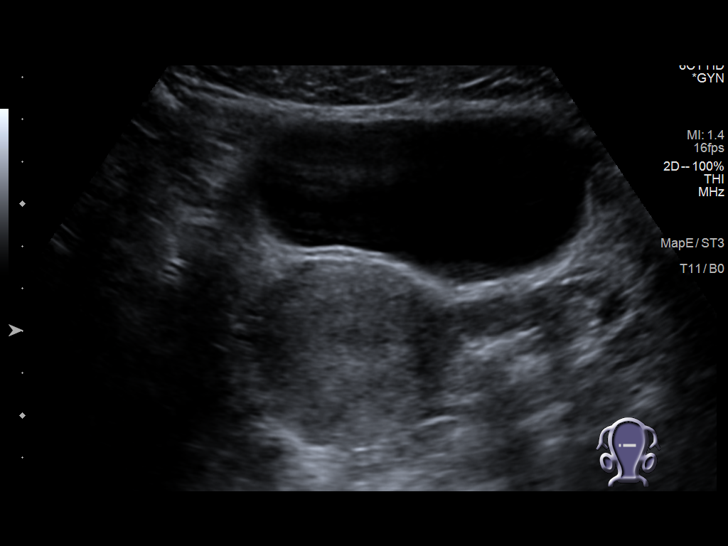
[im 34/136]
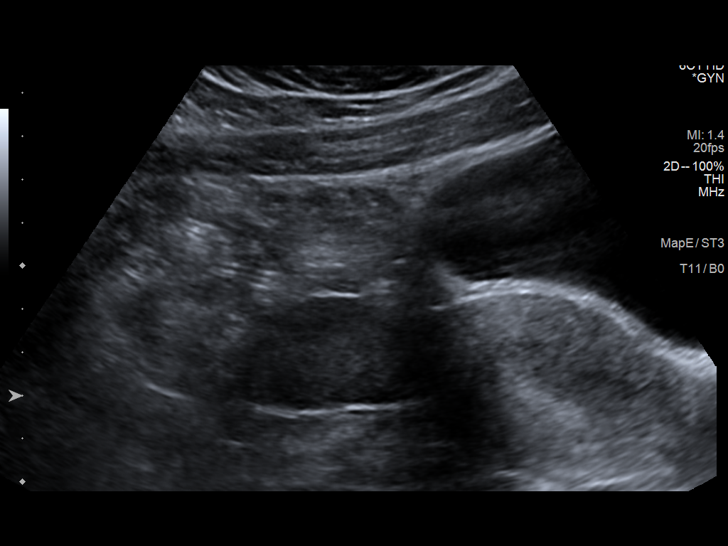
[im 46/136]
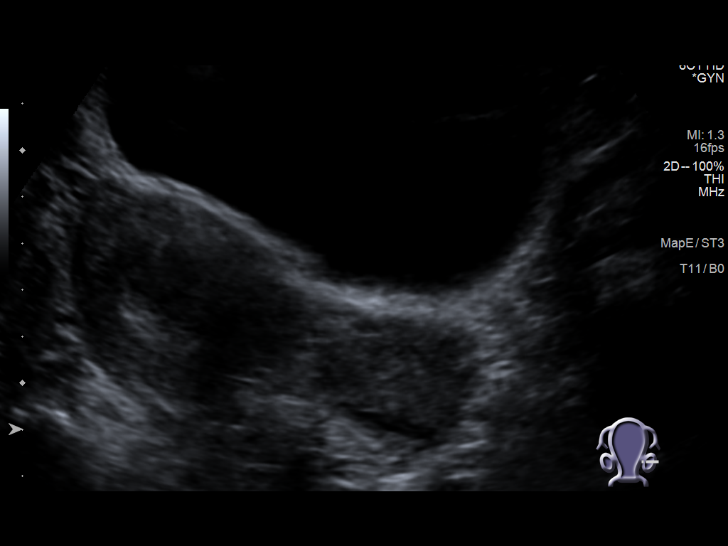
[im 51/136]
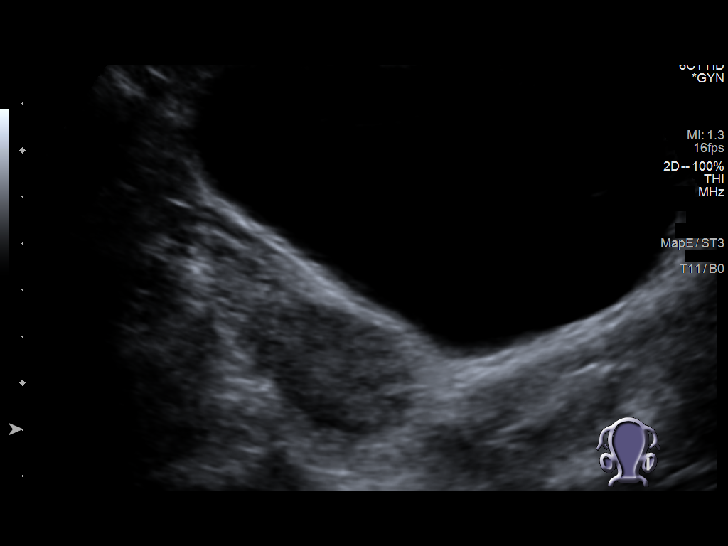
[im 62/136]
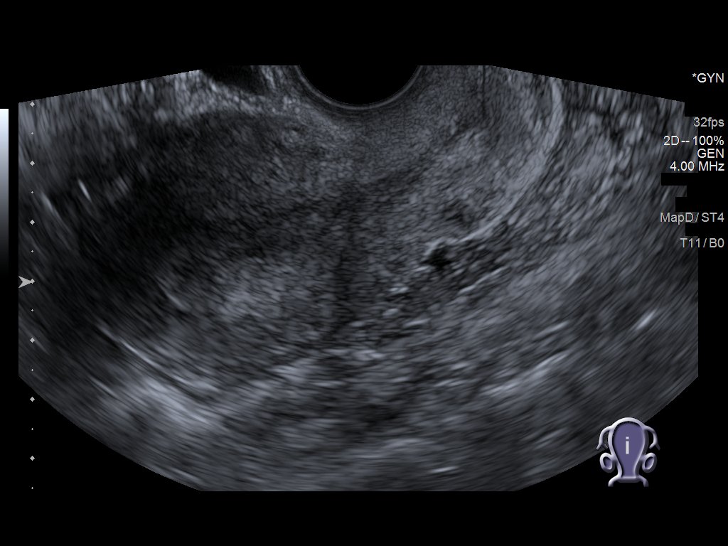
[im 74/136]
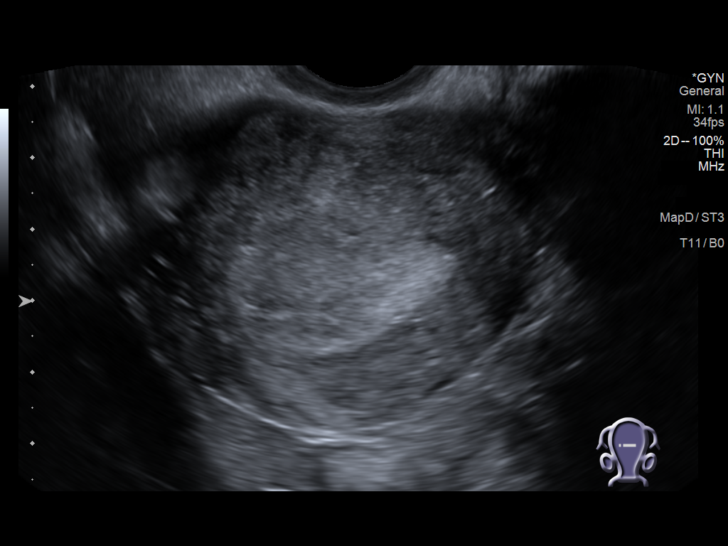
[im 85/136]
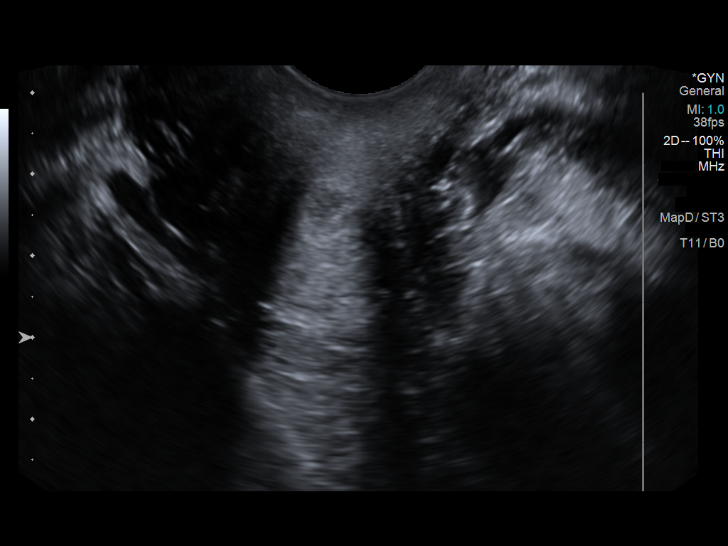
[im 91/136]
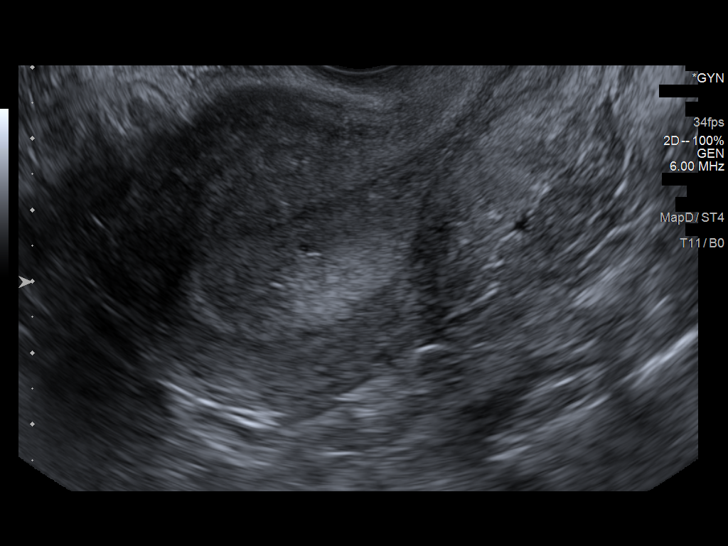
[im 102/136]
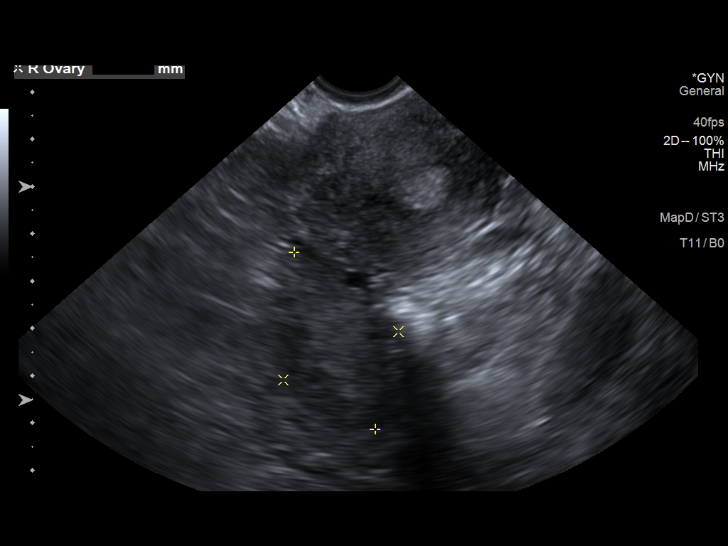
[im 113/136]
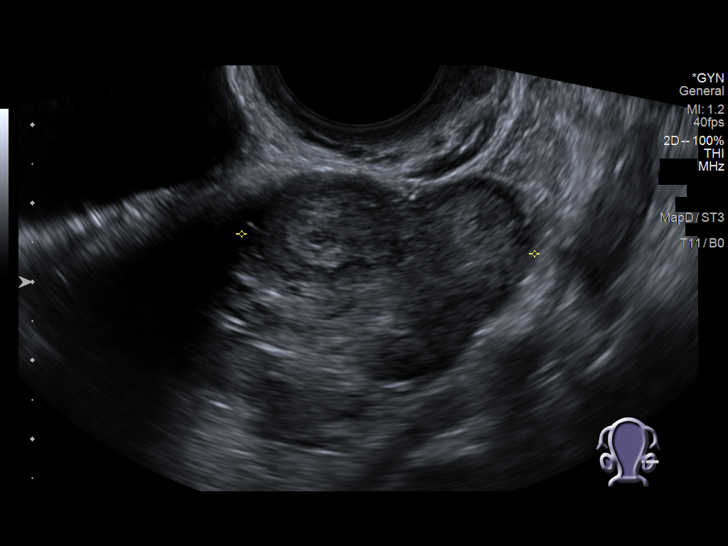
[im 124/136]
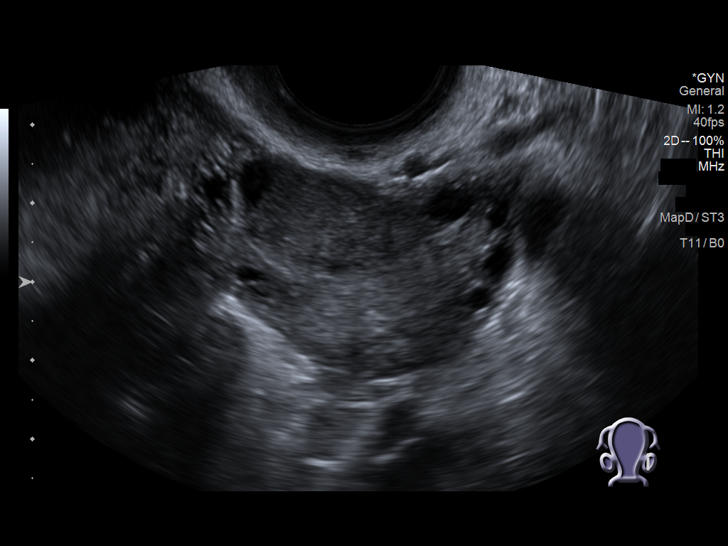
[im 136/136]
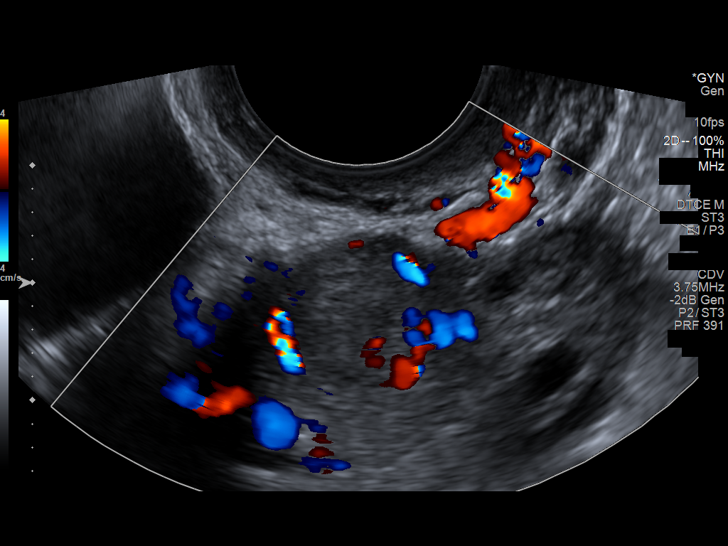

[14 of 25 positions shown; findings below may reference images not displayed]

FINDINGS: Uterus

Measurements: 8.2 x 3.6 x 5.0 cm = volume: 77 mL. Anteverted. Normal
morphology without mass

Endometrium

Thickness: 16 mm.  No endometrial fluid or focal abnormality

Right ovary

Measurements: 4.2 x 2.6 x 2.9 cm = volume: 16 mL. Normal morphology
without mass

Left ovary

Measurements: 3.7 x 2.9 x 3.7 cm = volume: 21 mL. Small resolving
corpus lutea within LEFT ovary. No additional mass.

Other findings

No free pelvic fluid or adnexal masses.
IMPRESSION: No pelvic sonographic abnormalities identified.

## 2021-10-27 ENCOUNTER — Encounter: Payer: Self-pay | Admitting: Medical

## 2021-10-27 ENCOUNTER — Ambulatory Visit (INDEPENDENT_AMBULATORY_CARE_PROVIDER_SITE_OTHER): Payer: BC Managed Care – PPO | Admitting: Medical

## 2021-10-27 VITALS — BP 130/78 | HR 100 | Temp 98.7°F | Resp 18 | Ht 64.0 in | Wt 255.0 lb

## 2021-10-27 DIAGNOSIS — J301 Allergic rhinitis due to pollen: Secondary | ICD-10-CM | POA: Diagnosis not present

## 2021-10-27 DIAGNOSIS — J4541 Moderate persistent asthma with (acute) exacerbation: Secondary | ICD-10-CM | POA: Diagnosis not present

## 2021-10-27 MED ORDER — METHYLPREDNISOLONE 4 MG PO TABS: ORAL_TABLET | ORAL | 0 refills | Status: DC

## 2021-10-27 NOTE — Patient Instructions (Addendum)
Allergic rhinitis and asthma exacerbation(moderate). Continue loratadine OTC and add montelukast. Rx advisement regarding both meds. I do think tapered dose of medrol needed for both conditions presently. ? ?Continue advair and albuterol inhalers. ? ?If signs and symptoms persist notify us. In that event would get cxr. Presently not suspicious for infectious cause. ? ?Follow up in 10-14 days or sooner if needed. ?

## 2021-10-27 NOTE — Progress Notes (Signed)
? ?Subjective:  ? ? Patient ID: Sherry Villegas, female    DOB: Jul 12, 1985, 36 y.o.   MRN: [id number] ? ?HPI ? ?Pt in for one week of probable allergies per pt. States she got nasal congestion with severe pnd. Pnd has been for one month but worse past week. Pt states she saw allergy and asthma specialist. Pt was given montelukast but she states did not start. Some wheezing over the past week. Pt has been using advair and albuterol. She states she has just recently followed up with pulmonologist.  ? ?Pt is not diabetic test.  ? ?Pt has not done covid test. No fever, no chills, no sweats or bodyaches.  ? ?Pnd for one month or more. Allo other symptoms combined for past 10 days. ? ? ?Review of Systems  ?Constitutional:  Negative for chills, fatigue and fever.  ?HENT:  Positive for postnasal drip and rhinorrhea. Negative for congestion, ear discharge, ear pain, hearing loss, sinus pain and sneezing.   ?Respiratory:  Positive for cough and wheezing. Negative for chest tightness and shortness of breath.   ?Cardiovascular:  Negative for chest pain and palpitations.  ?Gastrointestinal:  Negative for abdominal pain, constipation, nausea, rectal pain and vomiting.  ?Genitourinary:  Negative for dysuria.  ?Musculoskeletal:  Negative for back pain, myalgias and neck pain.  ?Skin:  Negative for rash.  ?Neurological:  Negative for dizziness, seizures, weakness and light-headedness.  ?Hematological:  Negative for adenopathy. Does not bruise/bleed easily.  ?Psychiatric/Behavioral:  Negative for behavioral problems.   ? ? ?Past Medical History:  ?Diagnosis Date  ? Anemia   ? Anxiety   ? Asthma   ? Back pain   ? Constipation   ? GERD (gastroesophageal reflux disease)   ? Hyperlipidemia   ? Hypertension   ? Migraine   ? Stomach ulcer   ? Vaginal Pap smear, abnormal   ? ?  ?Social History  ? ?Socioeconomic History  ? Marital status: Single  ?  Spouse name: Not on file  ? Number of children: Not on file  ? Years of education: Not on file   ? Highest education level: Associate degree: occupational, Scientist, product/process development, or vocational program  ?Occupational History  ? Occupation: Clinical biochemist rep   ? Occupation: Clinical biochemist rep  ?Tobacco Use  ? Smoking status: Every Day  ?  Packs/day: 0.25  ?  Years: 12.00  ?  Pack years: 3.00  ?  Types: Cigarettes  ? Smokeless tobacco: Never  ? Tobacco comments:  ?  Currently smoking about 5cigs per day as of 08/31/21 ep  ?Vaping Use  ? Vaping Use: Never used  ?Substance and Sexual Activity  ? Alcohol use: Yes  ?  Alcohol/week: 6.0 standard drinks  ?  Types: 6 Shots of liquor per week  ? Drug use: Yes  ?  Types: Marijuana  ?  Comment: special occassions only-- 1 x q51months  ? Sexual activity: Yes  ?  Partners: Male  ?  Birth control/protection: None  ?Other Topics Concern  ? Not on file  ?Social History Narrative  ? Not on file  ? ?Social Determinants of Health  ? ?Financial Resource Strain: Not on file  ?Food Insecurity: Not on file  ?Transportation Needs: Not on file  ?Physical Activity: Not on file  ?Stress: Not on file  ?Social Connections: Not on file  ?Intimate Partner Violence: Not on file  ? ? ?No past surgical history on file. ? ?Family History  ?Problem Relation Age of Onset  ?  Heart failure Mother   ? Hypertension Mother   ? Heart disease Mother 47  ?     MI  ? Stroke Mother   ? Sudden death Mother   ? Cancer Father   ? Heart failure Father   ? Stroke Father   ? Hypertension Father   ? Diabetes Father   ? Breast cancer Maternal Grandmother   ? Breast cancer Paternal Grandmother   ? Breast cancer Maternal Aunt   ? ? ?Allergies  ?Allergen Reactions  ? Dulera [Mometasone Furo-Formoterol Fum] Hives  ? Influenza Vaccines   ?  "itching"  ? Amoxil [Amoxicillin] Swelling  ? ? ?Current Outpatient Medications on File Prior to Visit  ?Medication Sig Dispense Refill  ? acetaminophen (TYLENOL) 325 MG tablet Take 650 mg by mouth every 6 (six) hours as needed.    ? ADVAIR HFA 230-21 MCG/ACT inhaler     ? albuterol (VENTOLIN  HFA) 108 (90 Base) MCG/ACT inhaler Inhale 2 puffs into the lungs every 6 (six) hours as needed for wheezing or shortness of breath. 1 g 2  ? Aspirin-Acetaminophen-Caffeine (EXCEDRIN PO) Take by mouth. Prn    ? famotidine (PEPCID) 20 MG tablet Take 1 tablet (20 mg total) by mouth daily. 30 tablet 0  ? megestrol (MEGACE) 40 MG tablet Take 1 tablet (40 mg total) by mouth daily. 30 tablet 5  ? metoprolol succinate (TOPROL-XL) 50 MG 24 hr tablet Take 1 tablet (50 mg total) by mouth daily. Take with or immediately following a meal. 30 tablet 0  ? metroNIDAZOLE (FLAGYL) 500 MG tablet Take 500 mg by mouth 2 (two) times daily.    ? montelukast (SINGULAIR) 10 MG tablet Take 1 tablet (10 mg total) by mouth at bedtime. 30 tablet 5  ? ondansetron (ZOFRAN) 4 MG tablet Take 1 tablet (4 mg total) by mouth every 8 (eight) hours as needed for nausea or vomiting. 20 tablet 0  ? pantoprazole (PROTONIX) 40 MG tablet Take 1 tablet (40 mg total) by mouth 2 (two) times daily. 60 tablet 3  ? phenazopyridine (PYRIDIUM) 100 MG tablet Take 100 mg by mouth 3 (three) times daily.    ? promethazine (PHENERGAN) 25 MG suppository Place 1 suppository (25 mg total) rectally every 6 (six) hours as needed for nausea or vomiting. 28 each 6  ? valACYclovir (VALTREX) 1000 MG tablet Take 1 tablet (1,000 mg total) by mouth 2 (two) times daily. 60 tablet 2  ? ?No current facility-administered medications on file prior to visit.  ? ? ?BP (!) 117/10   Pulse 100   Temp 98.7 ?F (37.1 ?C)   Resp 18   Ht 5\' 4"  (1.626 m)   Wt 255 lb (115.7 kg)   SpO2 100%   BMI 43.77 kg/m?  ? 130/78 ?   ?Objective:  ? Physical Exam ? ?General ?Mental Status- Alert. General Appearance- Not in acute distress.  ? ?Skin ?General: Color- Normal Color. Moisture- Normal Moisture. ? ?Neck ?Carotid Arteries- Normal color. Moisture- Normal Moisture. No carotid bruits. No JVD. ? ?Chest and Lung Exam ?Auscultation: ?Breath Sounds: even and unlabored. Mild shallow.   ? ?Cardiovascular ?Auscultation:Rythm- Regular. ?Murmurs & Other Heart Sounds:Auscultation of the heart reveals- No Murmurs. ? ?Abdomen ?Inspection:-Inspeection Normal. ?Palpation/Percussion:Note:No mass. Palpation and Percussion of the abdomen reveal- Non Tender, Non Distended + BS, no rebound or guarding. ? ? ?Neurologic ?Cranial Nerve exam:- CN III-XII intact(No nystagmus), symmetric smile. ?Strength:- 5/5 equal and symmetric strength both upper and lower extremities.  ? ?Heent- no  sinus pressure to palpation. Boggy turibinates. +pnd. ? ?   ?Assessment & Plan:  ? ?Patient Instructions  ?Allergic rhinitis and asthma exacerbation(moderate). Continue loratadine OTC and add montelukast. Rx advisement regarding both meds. I do think tapered dose of medrol needed for both conditions presently. ? ?Continue advair and albuterol inhalers. ? ?If signs and symptoms persist notify us. In that event would get cxr. Presently not suspicious for infectious cause. ? ?Follow up in 10-14 days or sooner if needed.  ? ?Esperanza Richters, PA-C  ?

## 2021-11-01 ENCOUNTER — Ambulatory Visit: Payer: BC Managed Care – PPO | Admitting: Internal Medicine

## 2021-11-05 ENCOUNTER — Ambulatory Visit: Payer: BC Managed Care – PPO | Admitting: Internal Medicine

## 2022-01-04 ENCOUNTER — Encounter: Payer: Self-pay | Admitting: Internal Medicine

## 2022-01-04 ENCOUNTER — Ambulatory Visit (INDEPENDENT_AMBULATORY_CARE_PROVIDER_SITE_OTHER): Payer: BC Managed Care – PPO | Admitting: Internal Medicine

## 2022-01-04 VITALS — BP 140/84 | HR 110 | Temp 98.8°F | Resp 16

## 2022-01-04 DIAGNOSIS — J4541 Moderate persistent asthma with (acute) exacerbation: Secondary | ICD-10-CM

## 2022-01-04 DIAGNOSIS — K219 Gastro-esophageal reflux disease without esophagitis: Secondary | ICD-10-CM

## 2022-01-04 DIAGNOSIS — J3089 Other allergic rhinitis: Secondary | ICD-10-CM

## 2022-01-04 DIAGNOSIS — J4540 Moderate persistent asthma, uncomplicated: Secondary | ICD-10-CM

## 2022-01-04 MED ORDER — IPRATROPIUM BROMIDE 0.06 % NA SOLN
2.0000 | Freq: Four times a day (QID) | NASAL | 12 refills | Status: DC
Start: 2022-01-04 — End: 2022-07-20

## 2022-01-16 ENCOUNTER — Other Ambulatory Visit: Payer: Self-pay | Admitting: Family Medicine

## 2022-01-16 DIAGNOSIS — I10 Essential (primary) hypertension: Secondary | ICD-10-CM

## 2022-02-01 ENCOUNTER — Other Ambulatory Visit: Payer: Self-pay | Admitting: Family Medicine

## 2022-02-10 ENCOUNTER — Ambulatory Visit: Payer: BC Managed Care – PPO | Admitting: Family Medicine

## 2022-02-14 ENCOUNTER — Other Ambulatory Visit: Payer: Self-pay | Admitting: Family Medicine

## 2022-02-14 DIAGNOSIS — I10 Essential (primary) hypertension: Secondary | ICD-10-CM

## 2022-02-16 ENCOUNTER — Encounter (INDEPENDENT_AMBULATORY_CARE_PROVIDER_SITE_OTHER): Payer: Self-pay

## 2022-02-18 ENCOUNTER — Telehealth: Payer: Self-pay

## 2022-02-18 NOTE — Telephone Encounter (Signed)
Patient called and stated she doesn't think it's a food allergy but she has this episodes with itching and breaking out in hives all over her body, she starts wheezing. It happens six months ago and her last reaction was last night 02/17/22. The allergy episode triggers has asthma. This morning her throat was hurting she had a bad coughing, hives are pretty much gone. She took 2 tbs of children tylenol. Every time she swallowed it felt like the food was getting stuck in her chest and it was very painful. She doesn't have an inhaler of her own but she has used her nephews inhaler before and it has calmed her down.  [name] [phone]

## 2022-02-21 ENCOUNTER — Other Ambulatory Visit: Payer: Self-pay | Admitting: Family Medicine

## 2022-02-21 MED ORDER — PROMETHAZINE HCL 25 MG PO TABS: 25.0000 mg | ORAL_TABLET | Freq: Four times a day (QID) | ORAL | 2 refills | Status: DC | PRN

## 2022-02-21 NOTE — Telephone Encounter (Signed)
Oh no I am sorry to hear this.  Please have her start Zyrtec 10 mg twice daily for prevention and schedule follow-up with me in clinic.  Thanks!

## 2022-03-02 ENCOUNTER — Other Ambulatory Visit: Payer: Self-pay

## 2022-03-08 ENCOUNTER — Encounter: Payer: Self-pay | Admitting: Family Medicine

## 2022-03-08 ENCOUNTER — Ambulatory Visit (INDEPENDENT_AMBULATORY_CARE_PROVIDER_SITE_OTHER): Payer: BC Managed Care – PPO | Admitting: Family Medicine

## 2022-03-08 VITALS — BP 118/90 | HR 95 | Temp 97.8°F | Resp 18 | Ht 64.0 in | Wt 261.4 lb

## 2022-03-08 DIAGNOSIS — K219 Gastro-esophageal reflux disease without esophagitis: Secondary | ICD-10-CM

## 2022-03-08 DIAGNOSIS — D649 Anemia, unspecified: Secondary | ICD-10-CM

## 2022-03-08 DIAGNOSIS — Z0000 Encounter for general adult medical examination without abnormal findings: Secondary | ICD-10-CM | POA: Diagnosis not present

## 2022-03-08 DIAGNOSIS — J449 Chronic obstructive pulmonary disease, unspecified: Secondary | ICD-10-CM

## 2022-03-08 DIAGNOSIS — Z0001 Encounter for general adult medical examination with abnormal findings: Secondary | ICD-10-CM

## 2022-03-08 DIAGNOSIS — I10 Essential (primary) hypertension: Secondary | ICD-10-CM | POA: Diagnosis not present

## 2022-03-08 DIAGNOSIS — J4489 Other specified chronic obstructive pulmonary disease: Secondary | ICD-10-CM

## 2022-03-08 LAB — POC URINALSYSI DIPSTICK (AUTOMATED)
Bilirubin, UA: NEGATIVE
Blood, UA: NEGATIVE
Glucose, UA: NEGATIVE
Ketones, UA: NEGATIVE
Leukocytes, UA: NEGATIVE
Nitrite, UA: NEGATIVE
Protein, UA: NEGATIVE
Spec Grav, UA: 1.01 (ref 1.010–1.025)
Urobilinogen, UA: 0.2 E.U./dL
pH, UA: 6.5 (ref 5.0–8.0)

## 2022-03-08 LAB — POCT URINE PREGNANCY: Preg Test, Ur: NEGATIVE

## 2022-03-08 MED ORDER — HYDROCHLOROTHIAZIDE 25 MG PO TABS: 25.0000 mg | ORAL_TABLET | Freq: Every day | ORAL | 3 refills | Status: DC

## 2022-03-08 MED ORDER — AMLODIPINE BESYLATE 5 MG PO TABS: 5.0000 mg | ORAL_TABLET | Freq: Every day | ORAL | 1 refills | Status: DC

## 2022-03-08 MED ORDER — FLUTICASONE FUROATE-VILANTEROL 200-25 MCG/ACT IN AEPB: 1.0000 | INHALATION_SPRAY | Freq: Every day | RESPIRATORY_TRACT | 11 refills | Status: DC

## 2022-03-08 MED ORDER — METOPROLOL SUCCINATE ER 50 MG PO TB24: ORAL_TABLET | ORAL | 1 refills | Status: DC

## 2022-03-08 MED ORDER — PREDNISONE 10 MG PO TABS: ORAL_TABLET | ORAL | 0 refills | Status: DC

## 2022-03-08 NOTE — Patient Instructions (Signed)

## 2022-03-08 NOTE — Progress Notes (Signed)
Subjective:     Sherry Villegas is a 36 y.o. female and is here for a comprehensive physical exam. The patient reports problems - she had a rash around her mouth that re appeared after her advair inh dose was increased.  Benadryl helped it  . Her asthma has been worse and she feels like the advair is not helping   she also c/o suprapubic tenderness Social History   Socioeconomic History   Marital status: Single    Spouse name: Not on file   Number of children: Not on file   Years of education: Not on file   Highest education level: Associate degree: occupational, Hotel manager, or vocational program  Occupational History   Occupation: customer service rep    Occupation: Customer service rep  Tobacco Use   Smoking status: Every Day    Packs/day: 0.25    Years: 15.00    Total pack years: 3.75    Types: Cigarettes   Smokeless tobacco: Never   Tobacco comments:    Currently smoking about 5cigs per day as of 08/31/21 ep  Vaping Use   Vaping Use: Never used  Substance and Sexual Activity   Alcohol use: Yes    Alcohol/week: 6.0 standard drinks of alcohol    Types: 6 Shots of liquor per week   Drug use: Yes    Types: Marijuana    Comment: special occassions only-- 1 x q6 months   Sexual activity: Yes    Partners: Male  Other Topics Concern   Not on file  Social History Narrative   Exercise--- no due to asthma   Social Determinants of Health   Financial Resource Strain: Not on file  Food Insecurity: Not on file  Transportation Needs: Not on file  Physical Activity: Not on file  Stress: Not on file  Social Connections: Not on file  Intimate Partner Violence: Not on file   Health Maintenance  Topic Date Due   COVID-19 Vaccine (1) Never done   INFLUENZA VACCINE  02/08/2022   PAP SMEAR-Modifier  05/29/2023   TETANUS/TDAP  05/09/2029   Hepatitis C Screening  Completed   HIV Screening  Completed   HPV VACCINES  Aged Out    The following portions of the patient's history were  reviewed and updated as appropriate: She  has a past medical history of Anemia, Anxiety, Asthma, Back pain, Constipation, GERD (gastroesophageal reflux disease), Hyperlipidemia, Hypertension, Migraine, Stomach ulcer, and Vaginal Pap smear, abnormal. She does not have any pertinent problems on file. She  has no past surgical history on file. Her family history includes Breast cancer in her maternal aunt, maternal grandmother, and paternal grandmother; Cancer in her father; Dementia in her father; Diabetes in her father; Heart attack in her father; Heart disease (age of onset: 10) in her mother; Heart failure in her father and mother; Hypertension in her father and mother; Stroke in her father and mother; Sudden death in her mother. She  reports that she has been smoking cigarettes. She has a 3.75 pack-year smoking history. She has never used smokeless tobacco. She reports current alcohol use of about 6.0 standard drinks of alcohol per week. She reports current drug use. Drug: Marijuana. She has a current medication list which includes the following prescription(s): acetaminophen, albuterol, amlodipine, aspirin-acetaminophen-caffeine, famotidine, fluticasone furoate-vilanterol, hydrochlorothiazide, ipratropium, pantoprazole, prednisone, promethazine, and valacyclovir. Current Outpatient Medications on File Prior to Visit  Medication Sig Dispense Refill   acetaminophen (TYLENOL) 325 MG tablet Take 650 mg by mouth every 6 (six)  hours as needed.     albuterol (VENTOLIN HFA) 108 (90 Base) MCG/ACT inhaler Inhale 2 puffs into the lungs every 6 (six) hours as needed for wheezing or shortness of breath. 1 g 2   Aspirin-Acetaminophen-Caffeine (EXCEDRIN PO) Take by mouth. Prn     famotidine (PEPCID) 20 MG tablet Take 1 tablet (20 mg total) by mouth daily. 30 tablet 0   ipratropium (ATROVENT) 0.06 % nasal spray Place 2 sprays into both nostrils 4 (four) times daily. 15 mL 12   pantoprazole (PROTONIX) 40 MG tablet  Take 1 tablet (40 mg total) by mouth 2 (two) times daily. 60 tablet 3   promethazine (PHENERGAN) 25 MG tablet Take 1 tablet (25 mg total) by mouth every 6 (six) hours as needed for nausea or vomiting. 30 tablet 2   valACYclovir (VALTREX) 1000 MG tablet Take 1 tablet by mouth twice daily 30 tablet 0   No current facility-administered medications on file prior to visit.   She is allergic to dulera [mometasone furo-formoterol fum], influenza vaccines, and amoxil [amoxicillin]..  Review of Systems Review of Systems  Constitutional: Negative for activity change, appetite change and fatigue.  HENT: Negative for hearing loss, congestion, tinnitus and ear discharge.  dentist q38m Eyes: Negative for visual disturbance (see optho q1y -- vision corrected to 20/20 with glasses).  Respiratory: +cough, wheezing and sob   Cardiovascular: Negative for chest pain, palpitations +leg swelling  Gastrointestinal: Negative for abdominal pain, diarrhea, constipation and abdominal distention. + suprapubic tenderness  Genitourinary: Negative for urgency, frequency, decreased urine volume and difficulty urinating.  Musculoskeletal: Negative for back pain, arthralgias and gait problem.  Skin: Negative for color change, pallor   + rash around mouth Neurological: Negative for dizziness, light-headedness,  and headaches. + numbness L arm  Hematological: Negative for adenopathy. Does not bruise/bleed easily.  Psychiatric/Behavioral: Negative for suicidal ideas, confusion, sleep disturbance, self-injury, dysphoric mood, decreased concentration and agitation.      Objective:    BP (!) 118/90 (BP Location: Left Arm, Patient Position: Sitting, Cuff Size: Large)   Pulse 95   Temp 97.8 F (36.6 C) (Oral)   Resp 18   Ht 5\' 4"  (1.626 m)   Wt 261 lb 6.4 oz (118.6 kg)   LMP 02/18/2022   SpO2 98%   BMI 44.87 kg/m  General appearance: alert, cooperative, appears stated age, and no distress Head: Normocephalic, without  obvious abnormality, atraumatic Eyes: conjunctivae/corneas clear. PERRL, EOM's intact. Fundi benign. Ears: normal TM's and external ear canals both ears Nose: Nares normal. Septum midline. Mucosa normal. No drainage or sinus tenderness. Throat: lips, mucosa, and tongue normal; teeth and gums normal Neck: no adenopathy, no carotid bruit, no JVD, supple, symmetrical, trachea midline, and thyroid not enlarged, symmetric, no tenderness/mass/nodules Back: symmetric, no curvature. ROM normal. No CVA tenderness. Lungs: diminished breath sounds bilaterally and wheezes bilaterally Heart: regular rate and rhythm, S1, S2 normal, no murmur, click, rub or gallop Abdomen: soft, non-tender; bowel sounds normal; no masses,  no organomegaly Extremities: extremities normal, atraumatic, no cyanosis or edema Pulses: 2+ and symmetric Skin: Skin color, texture, turgor normal. No rashes or lesions Lymph nodes: Cervical, supraclavicular, and axillary nodes normal. Neurologic: Alert and oriented X 3, normal strength and tone. Normal symmetric reflexes. Normal coordination and gait    Assessment:    Healthy female exam.      Plan:    Ghm utd Check labs  See After Visit Summary for Counseling Recommendations   1. Primary hypertension Change metoprolol to  norvasc and hctz F/u 2-3 weeks bp check  - CBC with Differential/Platelet - Comprehensive metabolic panel - Lipid panel - TSH - Vitamin B12 - VITAMIN D 25 Hydroxy (Vit-D Deficiency, Fractures) - POCT urine pregnancy - POCT Urinalysis Dipstick (Automated) - IBC panel - amLODipine (NORVASC) 5 MG tablet; Take 1 tablet (5 mg total) by mouth daily. D/c metoprolol  Dispense: 90 tablet; Refill: 1 - hydrochlorothiazide (HYDRODIURIL) 25 MG tablet; Take 1 tablet (25 mg total) by mouth daily.  Dispense: 90 tablet; Refill: 3  2. Anemia, unspecified type Check labs  - CBC with Differential/Platelet  3. Morbid obesity (HCC) Check labs  - IBC panel - Insulin,  random  4. Preventative health care Ghm utd Check labs  - CBC with Differential/Platelet - Comprehensive metabolic panel - Lipid panel - TSH - Vitamin B12 - VITAMIN D 25 Hydroxy (Vit-D Deficiency, Fractures) - POCT urine pregnancy - POCT Urinalysis Dipstick (Automated) - IBC panel  5. Asthmatic bronchitis , chronic (HCC) D/c advair, con't albuterol and add breo  - fluticasone furoate-vilanterol (BREO ELLIPTA) 200-25 MCG/ACT AEPB; Inhale 1 puff into the lungs daily.  Dispense: 1 each; Refill: 11 - predniSONE (DELTASONE) 10 MG tablet; TAKE 3 TABLETS PO QD FOR 3 DAYS THEN TAKE 2 TABLETS PO QD FOR 3 DAYS THEN TAKE 1 TABLET PO QD FOR 3 DAYS THEN TAKE 1/2 TAB PO QD FOR 3 DAYS  Dispense: 20 tablet; Refill: 0  6. Gastroesophageal reflux disease, unspecified whether esophagitis present Con't PPI  - H. pylori breath test

## 2022-03-09 ENCOUNTER — Encounter: Payer: Self-pay | Admitting: Internal Medicine

## 2022-03-09 ENCOUNTER — Ambulatory Visit (INDEPENDENT_AMBULATORY_CARE_PROVIDER_SITE_OTHER): Payer: BC Managed Care – PPO | Admitting: Internal Medicine

## 2022-03-09 VITALS — BP 120/70 | HR 105 | Temp 98.0°F | Resp 20 | Ht 65.0 in | Wt 260.9 lb

## 2022-03-09 DIAGNOSIS — L501 Idiopathic urticaria: Secondary | ICD-10-CM

## 2022-03-09 DIAGNOSIS — J4540 Moderate persistent asthma, uncomplicated: Secondary | ICD-10-CM | POA: Diagnosis not present

## 2022-03-09 DIAGNOSIS — J3089 Other allergic rhinitis: Secondary | ICD-10-CM

## 2022-03-09 DIAGNOSIS — T782XXA Anaphylactic shock, unspecified, initial encounter: Secondary | ICD-10-CM | POA: Diagnosis not present

## 2022-03-09 DIAGNOSIS — J4541 Moderate persistent asthma with (acute) exacerbation: Secondary | ICD-10-CM

## 2022-03-09 LAB — LIPID PANEL
Cholesterol: 233 mg/dL — ABNORMAL HIGH (ref 0–200)
HDL: 76.5 mg/dL (ref >39.00)
LDL Cholesterol: 124 mg/dL — ABNORMAL HIGH (ref 0–99)
NonHDL: 156.22
Total CHOL/HDL Ratio: 3
Triglycerides: 160 mg/dL — ABNORMAL HIGH (ref 0.0–149.0)
VLDL: 32 mg/dL (ref 0.0–40.0)

## 2022-03-09 LAB — INSULIN, RANDOM: Insulin: 41.4 u[IU]/mL — ABNORMAL HIGH

## 2022-03-09 LAB — COMPREHENSIVE METABOLIC PANEL
ALT: 9 U/L (ref 0–35)
AST: 14 U/L (ref 0–37)
Albumin: 4.2 g/dL (ref 3.5–5.2)
Alkaline Phosphatase: 76 U/L (ref 39–117)
BUN: 12 mg/dL (ref 6–23)
CO2: 23 mEq/L (ref 19–32)
Calcium: 9.1 mg/dL (ref 8.4–10.5)
Chloride: 103 mEq/L (ref 96–112)
Creatinine, Ser: 0.95 mg/dL (ref 0.40–1.20)
GFR: 78 mL/min (ref >60.00)
Glucose, Bld: 89 mg/dL (ref 70–99)
Potassium: 4.1 mEq/L (ref 3.5–5.1)
Sodium: 137 mEq/L (ref 135–145)
Total Bilirubin: 0.3 mg/dL (ref 0.2–1.2)
Total Protein: 7.9 g/dL (ref 6.0–8.3)

## 2022-03-09 LAB — TSH: TSH: 1.91 u[IU]/mL (ref 0.35–5.50)

## 2022-03-09 LAB — IBC PANEL
Iron: 23 ug/dL — ABNORMAL LOW (ref 42–145)
Saturation Ratios: 4.1 % — ABNORMAL LOW (ref 20.0–50.0)
TIBC: 564.2 ug/dL — ABNORMAL HIGH (ref 250.0–450.0)
Transferrin: 403 mg/dL — ABNORMAL HIGH (ref 212.0–360.0)

## 2022-03-09 LAB — VITAMIN D 25 HYDROXY (VIT D DEFICIENCY, FRACTURES): VITD: 9.5 ng/mL — ABNORMAL LOW (ref 30.00–100.00)

## 2022-03-09 LAB — VITAMIN B12: Vitamin B-12: 102 pg/mL — ABNORMAL LOW (ref 211–911)

## 2022-03-09 MED ORDER — EPINEPHRINE 0.3 MG/0.3ML IJ SOAJ: 0.3000 mg | INTRAMUSCULAR | 1 refills | Status: DC | PRN

## 2022-03-09 NOTE — Patient Instructions (Addendum)
Allergic Rhinitis  Previous testing 09/29/2021: Positive to grass, dust mites, roach Continue Ipatropium nasal spray: 1 spray per nostril as needed for runny nose  Use saline nasal spray as needed.    Allergic Reaction: Unsure if this was anaphylaxis or Hives+uncontrolled asthma, we wil lcover for both Continue Allergra 180mg  twice day We will get Hive labs: CBC w diff, CMP, tryptase, TSH, hive panel, alpha-gal panel, inflammatory markers - for SKIN only reaction, okay to take Benadryl 25  mg  every 6 hours - for SKIN + ANY additional symptoms, OR IF concern for LIFE THREATENING reaction = Epipen Autoinjector EpiPen 0.3 mg. - If using Epinephrine autoinjector, call 911 - A food allergy action plan has been provided and discussed. - Medic Alert identification is recommended.   Asthma: not well controlled  Daily controller medication(s): Breo 1 puff  a day and rinse mouth after each use.  May use albuterol rescue inhaler 2 puffs or nebulizer every 4 to 6 hours as needed for shortness of breath, chest tightness, coughing, and wheezing. May use albuterol rescue inhaler 2 puffs 5 to 15 minutes prior to strenuous physical activities. Monitor frequency of use.  Asthma control goals:  Full participation in all desired activities (may need albuterol before activity) Albuterol use two times or less a week on average (not counting use with activity) Cough interfering with sleep two times or less a month Oral steroids no more than once a year No hospitalizations   Heartburn: Continue lifestyle and dietary modifications Continue Protonix 40mg  twice a day as prescribed. Continue Pepcid 20mg  as prescribed. Follow up with GI regarding this.   Follow up: 4 weeks   Thank you so much for letting me partake in your care today.  Don't hesitate to reach out if you have any additional concerns!  , MD  [facility] [place], [place]

## 2022-03-09 NOTE — Progress Notes (Signed)
Follow Up Note RE: Sherry Villegas MRN: [id number] DOB: 11-25-1985 Date of Office Visit: 03/09/2022  Referring provider: Carollee Herter, Alferd Apa, * Primary care provider: Carollee Herter, Alferd Apa, DO  Chief Complaint: Allergic Rhinitis   History of Present Illness: I had the pleasure of seeing Sherry Villegas for a follow up visit at the [facility] [place] [facility]  on 03/09/2022. She is a 36 y.o. female, who is being followed for asthma, allergic rhinitis, GERD. Her previous allergy office visit was on 01/04/2022 with Dr. Edison Pace. Today is a regular follow up visit.  History obtained from patient, chart review.  ASTHMA - Medical therapy: There have been multiple changes to her asthma therapies she is followed by pulmonary 2.  Previously on Qvar and then switched to Advair.  She reports getting "breaking out" around her mouth with use of Advair.  Yesterday she was seen by either primary care pulmonary its unclear and was started on Breo 200 mcg.  She was also given prednisolone for worsening cough, dyspnea - Rescue inhaler use: Daily - Symptoms: She reports increased cough, dyspnea on exertion, chest tightness she presented to either primary care or pulmonary for symptoms yesterday - Exacerbation history: 0 ABX for respiratory illness since last visit, 2 OCS, 0ED, 1 UC visits in the past year  - ACT: 16/25 - Adverse effects of medication: Qvar starting causing bumps  - Previous FEV1: 2.22 L, 80% - Biologic Labs not done  Concern for Food Allergy:  Food of concern: meatballs with [place] breadcrumb,   History of reaction: After 5 minutes of eating she develops hand itching, which progresses to diffuse urticaria, wheezing.  This is happened a couple times in the past although she denies red meat exposure prior to this.  She also endorses worsening asthma control over this period of time.  She denies episodes of urticaria outside of wheezing, but she has not had urticaria outside of a period of  worsening asthma control Previous allergy testing no Eats egg, dairy, wheat, soy, fish, shellfish, peanuts, tree nuts, sesame without reactions Carries an epinephrine autoinjector: no Has food allergy action plan no   Assessment and Plan: [name] is a 36 y.o. female with: Anaphylaxis, initial encounter - Plan: CBC with Differential, Comprehensive metabolic panel, Tryptase, TSH, Chronic Urticaria, Alpha-Gal Panel, Sed Rate (ESR), C-reactive protein  Idiopathic urticaria - Plan: CBC with Differential, Comprehensive metabolic panel, Tryptase, TSH, Chronic Urticaria, Alpha-Gal Panel, Sed Rate (ESR), C-reactive protein  Moderate persistent asthma with (acute) exacerbation  Other allergic rhinitis Plan: Patient Instructions  Allergic Rhinitis  Previous testing 09/29/2021: Positive to grass, dust mites, roach Continue Ipatropium nasal spray: 1 spray per nostril as needed for runny nose  Use saline nasal spray as needed.    Allergic Reaction: Unsure if this was anaphylaxis or Hives+uncontrolled asthma, we wil lcover for both Continue Allergra 120m twice day We will get Hive labs: CBC w diff, CMP, tryptase, TSH, hive panel, alpha-gal panel, inflammatory markers - for SKIN only reaction, okay to take Benadryl 25  mg  every 6 hours - for SKIN + ANY additional symptoms, OR IF concern for LIFE THREATENING reaction = Epipen Autoinjector EpiPen 0.3 mg. - If using Epinephrine autoinjector, call 911 - A food allergy action plan has been provided and discussed. - Medic Alert identification is recommended.   Asthma: not well controlled  Daily controller medication(s): Breo 2065m 1 puff  a day and rinse mouth after each use.  May use albuterol rescue inhaler 2 puffs  or nebulizer every 4 to 6 hours as needed for shortness of breath, chest tightness, coughing, and wheezing. May use albuterol rescue inhaler 2 puffs 5 to 15 minutes prior to strenuous physical activities. Monitor frequency of use.   Asthma control goals:  Full participation in all desired activities (may need albuterol before activity) Albuterol use two times or less a week on average (not counting use with activity) Cough interfering with sleep two times or less a month Oral steroids no more than once a year No hospitalizations   Heartburn: Continue lifestyle and dietary modifications Continue Protonix 48m twice a day as prescribed. Continue Pepcid 268mas prescribed. Follow up with GI regarding this.   Follow up: 4 weeks   Thank you so much for letting me partake in your care today.  Don't hesitate to reach out if you have any additional concerns!  EvRoney MarionMD  [facility] [place], [place]   No follow-ups on file.  Meds ordered this encounter  Medications   EPINEPHrine 0.3 mg/0.3 mL IJ SOAJ injection    Sig: Inject 0.3 mg into the muscle as needed for anaphylaxis.    Dispense:  1 each    Refill:  1    Lab Orders         CBC with Differential         Comprehensive metabolic panel         Tryptase         TSH         Chronic Urticaria         Alpha-Gal Panel         Sed Rate (ESR)         C-reactive protein     Diagnostics: Not done  Medication List:  Current Outpatient Medications  Medication Sig Dispense Refill   acetaminophen (TYLENOL) 325 MG tablet Take 650 mg by mouth every 6 (six) hours as needed.     albuterol (VENTOLIN HFA) 108 (90 Base) MCG/ACT inhaler Inhale 2 puffs into the lungs every 6 (six) hours as needed for wheezing or shortness of breath. 1 g 2   amLODipine (NORVASC) 5 MG tablet Take 1 tablet (5 mg total) by mouth daily. D/c metoprolol 90 tablet 1   EPINEPHrine 0.3 mg/0.3 mL IJ SOAJ injection Inject 0.3 mg into the muscle as needed for anaphylaxis. 1 each 1   famotidine (PEPCID) 20 MG tablet Take 1 tablet (20 mg total) by mouth daily. 30 tablet 0   fluticasone furoate-vilanterol (BREO ELLIPTA) 200-25 MCG/ACT AEPB Inhale 1 puff into the lungs daily. 1  each 11   hydrochlorothiazide (HYDRODIURIL) 25 MG tablet Take 1 tablet (25 mg total) by mouth daily. 90 tablet 3   pantoprazole (PROTONIX) 40 MG tablet Take 1 tablet (40 mg total) by mouth 2 (two) times daily. 60 tablet 3   predniSONE (DELTASONE) 10 MG tablet TAKE 3 TABLETS PO QD FOR 3 DAYS THEN TAKE 2 TABLETS PO QD FOR 3 DAYS THEN TAKE 1 TABLET PO QD FOR 3 DAYS THEN TAKE 1/2 TAB PO QD FOR 3 DAYS 20 tablet 0   promethazine (PHENERGAN) 25 MG tablet Take 1 tablet (25 mg total) by mouth every 6 (six) hours as needed for nausea or vomiting. 30 tablet 2   Aspirin-Acetaminophen-Caffeine (EXCEDRIN PO) Take by mouth. Prn (Patient not taking: Reported on 03/09/2022)     ipratropium (ATROVENT) 0.06 % nasal spray Place 2 sprays into both nostrils 4 (four) times daily. (Patient not taking:  Reported on 03/09/2022) 15 mL 12   valACYclovir (VALTREX) 1000 MG tablet Take 1 tablet by mouth twice daily (Patient not taking: Reported on 03/09/2022) 30 tablet 0   No current facility-administered medications for this visit.   Allergies: Allergies  Allergen Reactions   Dulera [Mometasone Furo-Formoterol Fum] Hives   Influenza Vaccines     "itching"   Amoxil [Amoxicillin] Swelling   I reviewed her past medical history, social history, family history, and environmental history and no significant changes have been reported from her previous visit.  ROS: All others negative except as noted per HPI.   Objective: BP 120/70   Pulse (!) 105   Temp 98 F (36.7 C) (Temporal)   Resp 20   Ht 5' 5" (1.651 m)   Wt 260 lb 14.4 oz (118.3 kg)   LMP 02/18/2022   SpO2 96%   BMI 43.42 kg/m  Body mass index is 43.42 kg/m. General Appearance:  Alert, cooperative, no distress, appears stated age  Head:  Normocephalic, without obvious abnormality, atraumatic  Eyes:  Conjunctiva clear, EOM's intact  Nose: Nares normal,  erythematous nasal mucosa, hypertrophic turbinates, no visible anterior polyps, and septum midline  Throat:  Lips, tongue normal; teeth and gums normal, normal posterior oropharynx and no tonsillar exudate  Neck: Supple, symmetrical  Lungs:   clear to auscultation bilaterally, Respirations unlabored, no coughing  Heart:  regular rate and rhythm and no murmur, Appears well perfused  Extremities: No edema  Skin: Skin color, texture, turgor normal, no rashes or lesions on visualized portions of skin   Neurologic: No gross deficits   Previous notes and tests were reviewed. The plan was reviewed with the patient/family, and all questions/concerned were addressed.  It was my pleasure to see Annamary today and participate in her care. Please feel free to contact me with any questions or concerns.  Sincerely,  Roney Marion, MD  Allergy & Immunology  [facility] [place] [facility] Office: [phone]

## 2022-03-10 ENCOUNTER — Telehealth: Payer: Self-pay | Admitting: Family Medicine

## 2022-03-10 DIAGNOSIS — T782XXA Anaphylactic shock, unspecified, initial encounter: Secondary | ICD-10-CM | POA: Diagnosis not present

## 2022-03-10 DIAGNOSIS — L501 Idiopathic urticaria: Secondary | ICD-10-CM | POA: Diagnosis not present

## 2022-03-10 LAB — CBC WITH DIFFERENTIAL/PLATELET
Basophils Absolute: 0.1 10*3/uL (ref 0.0–0.1)
Basophils Relative: 1.2 % (ref 0.0–3.0)
Eosinophils Absolute: 0.1 10*3/uL (ref 0.0–0.7)
Eosinophils Relative: 1.9 % (ref 0.0–5.0)
HCT: 30 % — ABNORMAL LOW (ref 36.0–46.0)
Hemoglobin: 8.7 g/dL — ABNORMAL LOW (ref 12.0–15.0)
Lymphocytes Relative: 20.5 % (ref 12.0–46.0)
Lymphs Abs: 1.5 10*3/uL (ref 0.7–4.0)
MCHC: 29 g/dL — ABNORMAL LOW (ref 30.0–36.0)
MCV: 66.2 fl — ABNORMAL LOW (ref 78.0–100.0)
Monocytes Absolute: 0.5 10*3/uL (ref 0.1–1.0)
Monocytes Relative: 6.1 % (ref 3.0–12.0)
Neutro Abs: 5.3 10*3/uL (ref 1.4–7.7)
Neutrophils Relative %: 70.3 % (ref 43.0–77.0)
Platelets: 443 10*3/uL — ABNORMAL HIGH (ref 150.0–400.0)
RBC: 4.54 Mil/uL (ref 3.87–5.11)
RDW: 19.6 % — ABNORMAL HIGH (ref 11.5–15.5)
WBC: 7.5 10*3/uL (ref 4.0–10.5)

## 2022-03-10 LAB — H. PYLORI BREATH TEST: H. pylori Breath Test: NOT DETECTED

## 2022-03-10 NOTE — Telephone Encounter (Signed)
Pt called stated that she was having some issues with the med changes that were done on her prior visit. She stated that it caused her BP levels to rise and she unsure what to do now. Please Advise.

## 2022-03-11 ENCOUNTER — Encounter: Payer: Self-pay | Admitting: Family Medicine

## 2022-03-11 ENCOUNTER — Other Ambulatory Visit: Payer: Self-pay

## 2022-03-11 ENCOUNTER — Other Ambulatory Visit: Payer: Self-pay | Admitting: Family Medicine

## 2022-03-11 DIAGNOSIS — D649 Anemia, unspecified: Secondary | ICD-10-CM

## 2022-03-11 MED ORDER — VITAMIN D (ERGOCALCIFEROL) 1.25 MG (50000 UNIT) PO CAPS: 50000.0000 [IU] | ORAL_CAPSULE | ORAL | 1 refills | Status: DC

## 2022-03-11 NOTE — Telephone Encounter (Signed)
Nurse Assessment Nurse: Doree Barthel, RN, Danica Date/Time (Eastern Time): 03/10/2022 5:23:44 PM Confirm and document reason for call. If symptomatic, describe symptoms. ---Caller states she started a new BP medicine yesterday, feels like it is not working. BP and HR have been high all day. Latest BP was 139/90, HR 107. Last night BP was 189/102 and HR was 151. had a headache earlier. Does the patient have any new or worsening symptoms? ---Yes Will a triage be completed? ---Yes Related visit to physician within the last 2 weeks? ---Yes Does the PT have any chronic conditions? (i.e. diabetes, asthma, this includes High risk factors for pregnancy, etc.) ---Yes List chronic conditions. ---htn, asthma, allergies Is the patient pregnant or possibly pregnant? (Ask all females between the ages of 65-55) ---No Is this a behavioral health or substance abuse call? ---No Guidelines Guideline Title Affirmed Question Affirmed Notes Nurse Date/Time (Eastern Time) Blood Pressure - High Systolic BP >= 180 OR Diastolic >= 110 Bringas, RN, Danica 03/10/2022 5:27:59 PM PLEASE NOTE: All timestamps contained within this report are represented as [place] Standard Time. CONFIDENTIALTY NOTICE: This fax transmission is intended only for the addressee. It contains information that is legally privileged, confidential or otherwise protected from use or disclosure. If you are not the intended recipient, you are strictly prohibited from reviewing, disclosing, copying using or disseminating any of this information or taking any action in reliance on or regarding this information. If you have received this fax in error, please notify us immediately by telephone so that we can arrange for its return to [place]. Phone: [phone], Toll-Free: [phone], Fax: [phone] Page: 2 of 2 Call Id: [id number] Disp. Time Sherry Villegas Time) Disposition Final User 03/10/2022 5:31:42 PM See PCP within 24 Hours Yes Bringas, RN,  Danica Final Disposition 03/10/2022 5:31:42 PM See PCP within 24 Hours Yes Bringas, RN, Nicholes Mango Caller Disagree/Comply Comply Caller Understands Yes PreDisposition Did not know what to do Care Advice Given Per Guideline SEE PCP WITHIN 24 HOURS: * IF OFFICE WILL BE OPEN: You need to be examined within the next 24 hours. Call your doctor (or NP/PA) when the office opens and make an appointment. CALL BACK IF: * Weakness or numbness of the face, arm or leg on one side of the body occurs * Difficulty walking, difficulty talking, or severe headache occurs * Chest pain or difficulty breathing occurs * You become worse CARE ADVICE given per High Blood Pressure (Adult) guideline. Comments User: Lorrene Reid, RN Date/Time Sherry Villegas Time): 03/10/2022 5:32:16 PM caller states she called the office earlier when they were open and nobody called her back Referrals REFERRED TO PCP OFFICE

## 2022-03-15 ENCOUNTER — Ambulatory Visit (INDEPENDENT_AMBULATORY_CARE_PROVIDER_SITE_OTHER): Payer: BC Managed Care – PPO

## 2022-03-15 DIAGNOSIS — D649 Anemia, unspecified: Secondary | ICD-10-CM | POA: Diagnosis not present

## 2022-03-15 DIAGNOSIS — E538 Deficiency of other specified B group vitamins: Secondary | ICD-10-CM

## 2022-03-15 MED ORDER — CYANOCOBALAMIN 1000 MCG/ML IJ SOLN
1000.0000 ug | Freq: Once | INTRAMUSCULAR | Status: AC
  Administered 2022-03-15: 1000 ug via INTRAMUSCULAR

## 2022-03-15 NOTE — Telephone Encounter (Signed)
Spoke with pt Friday afternoon

## 2022-03-15 NOTE — Telephone Encounter (Signed)
Spoke with patient on Friday. At the time of the call. Pts blood pressure was 153/99, HR 112. Pt was advised per Dr. Laury Axon to restart the Metoprolol and only take 1 pill of the Norvasc. Pt was advised to let us know her blood pressures this week and was advised if her sxs come back and blood pressure remains high to go to the ED over the weekend

## 2022-03-15 NOTE — Progress Notes (Signed)
Martena Lorah is a 36 y.o. female presents to the office today for 1/4 injections, per physician's orders. Original order: 03/08/22: "B12 low--- may benefit from b12 weekly x4 then monthly " B12 given IM was administered L Deltoid  (location) today. Patient tolerated injection. Patient due for follow up labs/provider appt: No.  Patient next injection due: 1 week, appt made Yes  Creft, Melton Alar L

## 2022-03-22 LAB — CBC WITH DIFFERENTIAL/PLATELET
Basophils Absolute: 0 10*3/uL (ref 0.0–0.2)
Basos: 0 %
EOS (ABSOLUTE): 0 10*3/uL (ref 0.0–0.4)
Eos: 0 %
Hematocrit: 29.1 % — ABNORMAL LOW (ref 34.0–46.6)
Hemoglobin: 8.5 g/dL — ABNORMAL LOW (ref 11.1–15.9)
Immature Grans (Abs): 0 10*3/uL (ref 0.0–0.1)
Immature Granulocytes: 0 %
Lymphocytes Absolute: 1 10*3/uL (ref 0.7–3.1)
Lymphs: 12 %
MCH: 19.1 pg — ABNORMAL LOW (ref 26.6–33.0)
MCHC: 29.2 g/dL — ABNORMAL LOW (ref 31.5–35.7)
MCV: 65 fL — ABNORMAL LOW (ref 79–97)
Monocytes Absolute: 0.2 10*3/uL (ref 0.1–0.9)
Monocytes: 2 %
Neutrophils Absolute: 7.2 10*3/uL — ABNORMAL HIGH (ref 1.4–7.0)
Neutrophils: 86 %
Platelets: 446 10*3/uL (ref 150–450)
RBC: 4.46 x10E6/uL (ref 3.77–5.28)
RDW: 18.2 % — ABNORMAL HIGH (ref 11.7–15.4)
WBC: 8.4 10*3/uL (ref 3.4–10.8)

## 2022-03-22 LAB — TSH: TSH: 0.429 u[IU]/mL — ABNORMAL LOW (ref 0.450–4.500)

## 2022-03-22 LAB — COMPREHENSIVE METABOLIC PANEL
ALT: 10 IU/L (ref 0–32)
AST: 16 IU/L (ref 0–40)
Albumin/Globulin Ratio: 1.5 (ref 1.2–2.2)
Albumin: 4.9 g/dL (ref 3.9–4.9)
Alkaline Phosphatase: 91 IU/L (ref 44–121)
BUN/Creatinine Ratio: 10 (ref 9–23)
BUN: 11 mg/dL (ref 6–20)
Bilirubin Total: <0.2 mg/dL (ref 0.0–1.2)
CO2: 19 mmol/L — ABNORMAL LOW (ref 20–29)
Calcium: 10.2 mg/dL (ref 8.7–10.2)
Chloride: 101 mmol/L (ref 96–106)
Creatinine, Ser: 1.07 mg/dL — ABNORMAL HIGH (ref 0.57–1.00)
Globulin, Total: 3.2 g/dL (ref 1.5–4.5)
Glucose: 102 mg/dL — ABNORMAL HIGH (ref 70–99)
Potassium: 4.3 mmol/L (ref 3.5–5.2)
Sodium: 138 mmol/L (ref 134–144)
Total Protein: 8.1 g/dL (ref 6.0–8.5)
eGFR: 70 mL/min/{1.73_m2} (ref >59)

## 2022-03-22 LAB — C-REACTIVE PROTEIN: CRP: 6 mg/L (ref 0–10)

## 2022-03-22 LAB — CHRONIC URTICARIA: cu index: 13.7 — ABNORMAL HIGH (ref <10)

## 2022-03-22 LAB — ALPHA-GAL PANEL
Allergen Lamb IgE: <0.1 kU/L
Beef IgE: <0.1 kU/L
IgE (Immunoglobulin E), Serum: 119 IU/mL (ref 6–495)
O215-IgE Alpha-Gal: <0.1 kU/L
Pork IgE: <0.1 kU/L

## 2022-03-22 LAB — SEDIMENTATION RATE: Sed Rate: 108 mm/hr — ABNORMAL HIGH (ref 0–32)

## 2022-03-22 LAB — TRYPTASE: Tryptase: 5 ug/L (ref 2.2–13.2)

## 2022-03-23 ENCOUNTER — Ambulatory Visit (INDEPENDENT_AMBULATORY_CARE_PROVIDER_SITE_OTHER): Payer: BC Managed Care – PPO

## 2022-03-23 ENCOUNTER — Encounter: Payer: Self-pay | Admitting: Family Medicine

## 2022-03-23 DIAGNOSIS — E538 Deficiency of other specified B group vitamins: Secondary | ICD-10-CM | POA: Diagnosis not present

## 2022-03-23 MED ORDER — CYANOCOBALAMIN 1000 MCG/ML IJ SOLN
1000.0000 ug | Freq: Once | INTRAMUSCULAR | Status: AC
  Administered 2022-03-23: 1000 ug via INTRAMUSCULAR

## 2022-03-23 NOTE — Progress Notes (Signed)
Lab work returned positive for the chronic hive antibody and negative for red meat allergy or spontaneous anaphylaxis.  I suspect reaction was an exacerbation from chronic hives and asthma versus anaphylaxis.  Her labs also showed low blood counts, which primary care is currently treating. However her thyroid level was lower indicating a possible overactive thyroid and inflammatory markers (ESR) was elevated.  She should follow up with primary care in regards to further work up of these things.  Can someone let patient know?

## 2022-03-23 NOTE — Progress Notes (Signed)
Sherry Villegas is a 36 y.o. female presents to the office today for 2/4 injections, per physician's orders. Original order: 03/08/22: "B12 low--- may benefit from b12 weekly x4 then monthly " B12 given IM was administered L Deltoid (location) today. Patient tolerated injection. Patient due for follow up labs/provider appt: No.

## 2022-03-24 ENCOUNTER — Ambulatory Visit: Payer: BC Managed Care – PPO

## 2022-03-30 ENCOUNTER — Ambulatory Visit (INDEPENDENT_AMBULATORY_CARE_PROVIDER_SITE_OTHER): Payer: BC Managed Care – PPO

## 2022-03-30 DIAGNOSIS — E538 Deficiency of other specified B group vitamins: Secondary | ICD-10-CM

## 2022-03-30 MED ORDER — CYANOCOBALAMIN 1000 MCG/ML IJ SOLN
1000.0000 ug | Freq: Once | INTRAMUSCULAR | Status: AC
  Administered 2022-03-30: 1000 ug via INTRAMUSCULAR

## 2022-03-30 NOTE — Progress Notes (Signed)
Sherry Villegas is a 36 y.o. female presents to the office today for 34 injections, per physician's orders. Original order: 03/08/22: "B12 low--- may benefit from b12 weekly x4 then monthly " B12 1022mcg given IM was administered L Deltoid (location) today. Patient tolerated injection. Patient due for follow up labs/provider appt:yes pt has appt on 04/05/22 with PCP and will get next B-12 shot

## 2022-03-31 ENCOUNTER — Encounter: Payer: Self-pay | Admitting: Family Medicine

## 2022-03-31 ENCOUNTER — Ambulatory Visit (INDEPENDENT_AMBULATORY_CARE_PROVIDER_SITE_OTHER): Payer: BC Managed Care – PPO | Admitting: Family Medicine

## 2022-03-31 VITALS — BP 132/85 | HR 116 | Wt 252.0 lb

## 2022-03-31 DIAGNOSIS — N83201 Unspecified ovarian cyst, right side: Secondary | ICD-10-CM

## 2022-03-31 MED ORDER — DICLOFENAC SODIUM 75 MG PO TBEC: 75.0000 mg | DELAYED_RELEASE_TABLET | Freq: Two times a day (BID) | ORAL | 2 refills | Status: DC

## 2022-03-31 MED ORDER — KETOROLAC TROMETHAMINE 30 MG/ML IJ SOLN
30.0000 mg | Freq: Once | INTRAMUSCULAR | Status: AC
  Administered 2022-03-31: 30 mg via INTRAMUSCULAR

## 2022-03-31 NOTE — Progress Notes (Signed)
Patient worked in today for some abdominal pain. Patient states she has a cysts that causes her pain every six months or so. Started Sunday with some ovulation pain. Patient states that yesterday she was having vomiting with her pain. [facility]

## 2022-03-31 NOTE — Progress Notes (Signed)
   Subjective:    Patient ID: Sherry Villegas, female    DOB: 09-Apr-1986, 36 y.o.   MRN: [id number]  HPI  Patient seen for right lower quadrant abdominal pain that started on Sunday and has been continuing since then.  She gets this pain every few months and associates it with an ovarian cyst.  She has had this type of pain in the past and had imaging showing an ovarian cyst.  This pain is similar to that previous pain.  Pain is worse with movement such as bending and twisting.  Pain does not radiate.  Mild improvement with Tylenol.  Denies fevers, chills, nausea, vomiting.  Review of Systems     Objective:   Physical Exam Vitals reviewed.  Constitutional:      Appearance: Normal appearance.  Abdominal:     General: Abdomen is flat. There is no distension.     Palpations: Abdomen is soft. There is no mass.     Tenderness: There is abdominal tenderness (RLQ tenderness). There is no guarding or rebound.     Hernia: No hernia is present.  Neurological:     General: No focal deficit present.     Mental Status: She is alert.  Psychiatric:        Mood and Affect: Mood normal.        Behavior: Behavior normal.        Thought Content: Thought content normal.        Judgment: Judgment normal.        Assessment & Plan:   1. Cyst of right ovary Likely ovarian cyst. Discussed NSAIDs, tylenol for pain. Toradol 30mg  given in the office. Will give diclofenac BID for the next few days to help with the pain. If Camire having pain early next week, patient to call and will get imaging.

## 2022-04-05 ENCOUNTER — Telehealth: Payer: Self-pay | Admitting: Family Medicine

## 2022-04-05 ENCOUNTER — Encounter: Payer: Self-pay | Admitting: Family Medicine

## 2022-04-05 ENCOUNTER — Ambulatory Visit (INDEPENDENT_AMBULATORY_CARE_PROVIDER_SITE_OTHER): Payer: BC Managed Care – PPO | Admitting: Family Medicine

## 2022-04-05 VITALS — BP 124/68 | HR 100 | Temp 98.3°F | Ht 64.0 in | Wt 254.0 lb

## 2022-04-05 DIAGNOSIS — R059 Cough, unspecified: Secondary | ICD-10-CM

## 2022-04-05 DIAGNOSIS — I10 Essential (primary) hypertension: Secondary | ICD-10-CM | POA: Diagnosis not present

## 2022-04-05 DIAGNOSIS — D649 Anemia, unspecified: Secondary | ICD-10-CM

## 2022-04-05 DIAGNOSIS — J4521 Mild intermittent asthma with (acute) exacerbation: Secondary | ICD-10-CM

## 2022-04-05 DIAGNOSIS — R7989 Other specified abnormal findings of blood chemistry: Secondary | ICD-10-CM | POA: Insufficient documentation

## 2022-04-05 DIAGNOSIS — D508 Other iron deficiency anemias: Secondary | ICD-10-CM

## 2022-04-05 DIAGNOSIS — J449 Chronic obstructive pulmonary disease, unspecified: Secondary | ICD-10-CM

## 2022-04-05 DIAGNOSIS — E538 Deficiency of other specified B group vitamins: Secondary | ICD-10-CM | POA: Diagnosis not present

## 2022-04-05 LAB — POC COVID19 BINAXNOW: SARS Coronavirus 2 Ag: NEGATIVE

## 2022-04-05 MED ORDER — AZITHROMYCIN 250 MG PO TABS: ORAL_TABLET | ORAL | 0 refills | Status: DC

## 2022-04-05 MED ORDER — PREDNISONE 10 MG PO TABS: ORAL_TABLET | ORAL | 0 refills | Status: DC

## 2022-04-05 MED ORDER — METOPROLOL SUCCINATE ER 50 MG PO TB24: 50.0000 mg | ORAL_TABLET | Freq: Every day | ORAL | 3 refills | Status: DC

## 2022-04-05 MED ORDER — CYANOCOBALAMIN 1000 MCG/ML IJ SOLN
1000.0000 ug | Freq: Once | INTRAMUSCULAR | Status: AC
  Administered 2022-04-05: 1000 ug via INTRAMUSCULAR

## 2022-04-05 NOTE — Assessment & Plan Note (Signed)
Well controlled, no changes to meds. Encouraged heart healthy diet such as the DASH diet and exercise as tolerated.  °

## 2022-04-05 NOTE — Assessment & Plan Note (Signed)
z pak pred taper  con't coricidin hbp  con't breo and albuterol F/u 6 months or sooner prn

## 2022-04-05 NOTE — Assessment & Plan Note (Signed)
Check labs 

## 2022-04-05 NOTE — Patient Instructions (Signed)

## 2022-04-05 NOTE — Assessment & Plan Note (Signed)
Recheck today. 

## 2022-04-05 NOTE — Progress Notes (Signed)
Established Patient Office Visit  Subjective   Patient ID: Sherry Villegas, female    DOB: 1986-06-19  Age: 36 y.o. MRN: [id number]  Chief Complaint  Patient presents with   Hypertension    And B12 injection today.    Thyroid Problem    Concerns per allergist     HPI    Review of Systems  Constitutional:  Negative for chills, fever and malaise/fatigue.  HENT:  Negative for congestion.   Eyes:  Negative for blurred vision.  Respiratory:  Positive for cough, sputum production, shortness of breath and wheezing.   Cardiovascular:  Negative for chest pain, palpitations and leg swelling.  Gastrointestinal:  Negative for abdominal pain, blood in stool and nausea.  Genitourinary:  Negative for dysuria and frequency.  Musculoskeletal:  Negative for falls.  Skin:  Negative for rash.  Neurological:  Negative for dizziness, loss of consciousness and headaches.  Endo/Heme/Allergies:  Negative for environmental allergies.  Psychiatric/Behavioral:  Negative for depression. The patient is not nervous/anxious.       Objective:     BP 124/68   Pulse 100   Temp 98.3 F (36.8 C) (Oral)   Ht 5\' 4"  (1.626 m)   Wt 254 lb (115.2 kg)   LMP 03/16/2022 (Exact Date)   SpO2 98%   BMI 43.60 kg/m    Physical Exam Vitals and nursing note reviewed.  Constitutional:      Appearance: She is well-developed.  HENT:     Head: Normocephalic and atraumatic.  Eyes:     Conjunctiva/sclera: Conjunctivae normal.  Neck:     Thyroid: No thyromegaly.     Vascular: No carotid bruit or JVD.  Cardiovascular:     Rate and Rhythm: Normal rate and regular rhythm.     Heart sounds: Normal heart sounds. No murmur heard. Pulmonary:     Effort: Pulmonary effort is normal. No respiratory distress.     Breath sounds: Decreased air movement present. Wheezing present. No rales.  Chest:     Chest wall: No tenderness.  Musculoskeletal:     Cervical back: Normal range of motion and neck supple.  Neurological:      Mental Status: She is alert and oriented to person, place, and time.      Results for orders placed or performed in visit on 04/05/22  POC COVID-19  Result Value Ref Range   SARS Coronavirus 2 Ag Negative Negative      The ASCVD Risk score (Arnett DK, et al., 2019) failed to calculate for the following reasons:   The 2019 ASCVD risk score is only valid for ages 49 to 4    Assessment & Plan:   Problem List Items Addressed This Visit       Unprioritized   Morbid obesity ([place])   Relevant Orders   Amb Ref to Medical Weight Management   Cough   Relevant Orders   POC COVID-19 (Completed)   Primary hypertension - Primary    Well controlled, no changes to meds. Encouraged heart healthy diet such as the DASH diet and exercise as tolerated.       Relevant Medications   metoprolol succinate (TOPROL-XL) 50 MG 24 hr tablet   Iron deficiency anemia    Check labs       Asthmatic bronchitis , chronic (HCC)    z pak pred taper  con't coricidin hbp  con't breo and albuterol F/u 6 months or sooner prn       Relevant Medications   predniSONE (  DELTASONE) 10 MG tablet   Abnormal thyroid blood test    Recheck today      Relevant Orders   TSH   Other Visit Diagnoses     Anemia, unspecified type       Relevant Medications   cyanocobalamin (VITAMIN B12) injection 1,000 mcg (Completed)   B12 deficiency       Relevant Medications   cyanocobalamin (VITAMIN B12) injection 1,000 mcg (Completed)   Mild intermittent asthmatic bronchitis with acute exacerbation       Relevant Medications   predniSONE (DELTASONE) 10 MG tablet   azithromycin (ZITHROMAX Z-PAK) 250 MG tablet       Return in about 6 months (around 10/04/2022), or if symptoms worsen or fail to improve, for annual exam, fasting.    Lelon Perla Chase, DO B12 shot 4 out 4 weekly given today. Left arm and tolerated well. Labs drawn and she saw the provider today.

## 2022-04-05 NOTE — Telephone Encounter (Signed)
Medication: amLODipine (NORVASC) 5 MG tablet [[id number]  *Patient changed pharmacy *  Preferred Pharmacy (with phone number or street name): [facility]  [address] [place], [place] [address]  Agent: Please be advised that RX refills may take up to 3 business days. We ask that you follow-up with your pharmacy.

## 2022-04-06 DIAGNOSIS — M79671 Pain in right foot: Secondary | ICD-10-CM | POA: Diagnosis not present

## 2022-04-06 DIAGNOSIS — B351 Tinea unguium: Secondary | ICD-10-CM | POA: Diagnosis not present

## 2022-04-06 DIAGNOSIS — M79672 Pain in left foot: Secondary | ICD-10-CM | POA: Diagnosis not present

## 2022-04-06 LAB — CBC WITH DIFFERENTIAL/PLATELET
Basophils Absolute: 0 10*3/uL (ref 0.0–0.1)
Basophils Relative: 0.6 % (ref 0.0–3.0)
Eosinophils Absolute: 0.1 10*3/uL (ref 0.0–0.7)
Eosinophils Relative: 0.9 % (ref 0.0–5.0)
HCT: 29.4 % — ABNORMAL LOW (ref 36.0–46.0)
Hemoglobin: 8.8 g/dL — ABNORMAL LOW (ref 12.0–15.0)
Lymphocytes Relative: 20.7 % (ref 12.0–46.0)
Lymphs Abs: 1.6 10*3/uL (ref 0.7–4.0)
MCHC: 29.9 g/dL — ABNORMAL LOW (ref 30.0–36.0)
MCV: 64.9 fl — ABNORMAL LOW (ref 78.0–100.0)
Monocytes Absolute: 0.5 10*3/uL (ref 0.1–1.0)
Monocytes Relative: 6.7 % (ref 3.0–12.0)
Neutro Abs: 5.4 10*3/uL (ref 1.4–7.7)
Neutrophils Relative %: 71.1 % (ref 43.0–77.0)
Platelets: 530 10*3/uL — ABNORMAL HIGH (ref 150.0–400.0)
RBC: 4.52 Mil/uL (ref 3.87–5.11)
RDW: 19.9 % — ABNORMAL HIGH (ref 11.5–15.5)
WBC: 7.6 10*3/uL (ref 4.0–10.5)

## 2022-04-06 LAB — VITAMIN D 25 HYDROXY (VIT D DEFICIENCY, FRACTURES): VITD: 38.4 ng/mL (ref 30.00–100.00)

## 2022-04-06 LAB — VITAMIN B12: Vitamin B-12: 323 pg/mL (ref 211–911)

## 2022-04-06 MED ORDER — AMLODIPINE BESYLATE 5 MG PO TABS: 5.0000 mg | ORAL_TABLET | Freq: Every day | ORAL | 1 refills | Status: DC

## 2022-04-06 NOTE — Telephone Encounter (Signed)
Rx sent to new pharmacy.

## 2022-04-20 ENCOUNTER — Other Ambulatory Visit: Payer: Self-pay

## 2022-04-20 ENCOUNTER — Other Ambulatory Visit (INDEPENDENT_AMBULATORY_CARE_PROVIDER_SITE_OTHER): Payer: BC Managed Care – PPO

## 2022-04-20 ENCOUNTER — Other Ambulatory Visit: Payer: Self-pay | Admitting: Family Medicine

## 2022-04-20 ENCOUNTER — Ambulatory Visit (INDEPENDENT_AMBULATORY_CARE_PROVIDER_SITE_OTHER): Payer: BC Managed Care – PPO | Admitting: Internal Medicine

## 2022-04-20 ENCOUNTER — Telehealth: Payer: Self-pay | Admitting: Family Medicine

## 2022-04-20 VITALS — BP 124/76 | HR 82 | Temp 97.9°F | Resp 18 | Wt 253.7 lb

## 2022-04-20 DIAGNOSIS — J4540 Moderate persistent asthma, uncomplicated: Secondary | ICD-10-CM | POA: Diagnosis not present

## 2022-04-20 DIAGNOSIS — D649 Anemia, unspecified: Secondary | ICD-10-CM

## 2022-04-20 DIAGNOSIS — T782XXD Anaphylactic shock, unspecified, subsequent encounter: Secondary | ICD-10-CM

## 2022-04-20 DIAGNOSIS — L501 Idiopathic urticaria: Secondary | ICD-10-CM

## 2022-04-20 DIAGNOSIS — J3089 Other allergic rhinitis: Secondary | ICD-10-CM

## 2022-04-20 DIAGNOSIS — B370 Candidal stomatitis: Secondary | ICD-10-CM

## 2022-04-20 LAB — CBC WITH DIFFERENTIAL/PLATELET
Basophils Absolute: 0 10*3/uL (ref 0.0–0.1)
Basophils Relative: 0.4 % (ref 0.0–3.0)
Eosinophils Absolute: 0 10*3/uL (ref 0.0–0.7)
Eosinophils Relative: 0.1 % (ref 0.0–5.0)
HCT: 29.7 % — ABNORMAL LOW (ref 36.0–46.0)
Hemoglobin: 8.8 g/dL — ABNORMAL LOW (ref 12.0–15.0)
Lymphocytes Relative: 26.5 % (ref 12.0–46.0)
Lymphs Abs: 2.6 10*3/uL (ref 0.7–4.0)
MCHC: 29.8 g/dL — ABNORMAL LOW (ref 30.0–36.0)
MCV: 64.6 fl — ABNORMAL LOW (ref 78.0–100.0)
Monocytes Absolute: 0.6 10*3/uL (ref 0.1–1.0)
Monocytes Relative: 5.6 % (ref 3.0–12.0)
Neutro Abs: 6.7 10*3/uL (ref 1.4–7.7)
Neutrophils Relative %: 67.4 % (ref 43.0–77.0)
Platelets: 587 10*3/uL — ABNORMAL HIGH (ref 150.0–400.0)
RBC: 4.59 Mil/uL (ref 3.87–5.11)
RDW: 20 % — ABNORMAL HIGH (ref 11.5–15.5)
WBC: 9.9 10*3/uL (ref 4.0–10.5)

## 2022-04-20 MED ORDER — NYSTATIN 100000 UNIT/ML MT SUSP: 5.0000 mL | Freq: Four times a day (QID) | OROMUCOSAL | 0 refills | Status: DC

## 2022-04-20 NOTE — Progress Notes (Signed)
Follow Up Note  RE: Sherry Villegas MRN: [id number] DOB: [birth year] Date of Office Visit: 04/20/2022  Referring provider: Ann Held, * Primary care provider: Carollee Herter, Alferd Apa, DO  Chief Complaint: No chief complaint on file.  History of Present Illness: I had the pleasure of seeing Sherry Villegas for a follow up visit at the [facility] on 04/20/2022. She is a 36 y.o. female, who is being followed for asthma, urticaria, anaphylaxis, allergic rhinitis. Her previous allergy office visit was on 03/09/22 with Dr. Edison Pace. Today is a regular follow up visit.  History obtained from patient, chart review.  Since last visit she reports having increased cough, dyspnea starting mid-September.  She was seen by her primary care and given azithromycin and prednisone by her primary care on 04/05/2022.  Since then symptoms have improved however she has noticed a metallic taste in her mouth, soreness of the back of her throat and upper palate.  She is not washing her mouth out with her Breo.  She does report compliance with Breo.  Since azithromycin and prednisone she has not had to use her rescue inhaler however prior to that she was using it 2-3 times a day.  She is interested in Biologics.  In regards to urticaria she had to decrease her Allegra to once a day due to concern of running out before she could pick up a refill today.  She has had a few episodes of urticaria occurring every week or 2, despite use of Allegra.  She denies any other systemic reactions since last visit.  She does have her EpiPen and action plan  She has had increased rhinorrhea prior to asthma exacerbation however currently symptoms are well controlled on ipratropium nasal spray.   Assessment and Plan: Cylah is a 36 y.o. female with: Moderate persistent asthma without complication - Plan: Allergens w/Total IgE Area 2, CBC With Diff/Platelet  Idiopathic urticaria  Other allergic  rhinitis  Anaphylaxis, subsequent encounter  Oral candidiasis Plan: Patient Instructions  Allergic Rhinitis  Previous testing 09/29/2021: Positive to grass, dust mites, roach Continue Ipatropium nasal spray: 1 spray per nostril as needed for runny nose  Use saline nasal spray as needed.    Urticaria  Continue Allergra 17m twice day  Hive labs: Microcytic anemia,, low TSH, elevated ESR and very positive CU index - for SKIN only reaction, okay to take Benadryl 25  mg  every 6 hours - for SKIN + ANY additional symptoms, OR IF concern for LIFE THREATENING reaction = Epipen Autoinjector EpiPen 0.3 mg. - If using Epinephrine autoinjector, call 911 - A food allergy action plan has been provided and discussed. - Medic Alert identification is recommended.   Asthma: Breathing test today looked ok Given this is your third prednisone course this year we will look at Biologics for poorly controlled asthma Get labs 2 weeks after you finish your current prednisone course for rest evaluate which Biologics are an option We discussed Dupixent and Xolair is the best options for you as these will likely help your chronic hives as well  -Patient handouts given on both Dupixent and Xolair today Daily controller medication(s): Breo 2075m 1 puff  a day and rinse mouth after each use.  May use albuterol rescue inhaler 2 puffs or nebulizer every 4 to 6 hours as needed for shortness of breath, chest tightness, coughing, and wheezing. May use albuterol rescue inhaler 2 puffs 5 to 15 minutes prior to strenuous physical activities. Monitor  frequency of use.  Asthma control goals:  Full participation in all desired activities (may need albuterol before activity) Albuterol use two times or less a week on average (not counting use with activity) Cough interfering with sleep two times or less a month Oral steroids no more than once a year No hospitalizations   Thrush - Start nystatin swish and swallow 4 times  a day  - Wash mouth out after inhaler use   Heartburn: Continue lifestyle and dietary modifications Continue Protonix 59m twice a day as prescribed. Continue Pepcid 220mas prescribed. Follow up with GI regarding this.   Follow up:  we will contact you with biologic options, Once I get the lab results I will contact our Biologic coordinator Tammy VonCannon who coordinate approval process Follow-up: After starting biologic I would like to see you in clinic in 3 months  Thank you so much for letting me partake in your care today.  Don't hesitate to reach out if you have any additional concerns!  EvRoney MarionMD  [facility] [place] [place]  No follow-ups on file.  Meds ordered this encounter  Medications   nystatin (MYCOSTATIN) 100000 UNIT/ML suspension    Sig: Take 5 mLs (500,000 Units total) by mouth 4 (four) times daily.    Dispense:  60 mL    Refill:  0    Lab Orders         Allergens w/Total IgE Area 2         CBC With Diff/Platelet     Diagnostics: Spirometry:  Tracings reviewed. Her effort: Good reproducible efforts. FVC: 2.85 L FEV1: 2.23 L, 80% predicted FEV1/FVC ratio: 78% Interpretation: Spirometry consistent with normal pattern.  Please see scanned spirometry results for details.   Results interpreted by myself during this encounter and discussed with patient/family.   Medication List:  Current Outpatient Medications  Medication Sig Dispense Refill   acetaminophen (TYLENOL) 325 MG tablet Take 650 mg by mouth every 6 (six) hours as needed.     albuterol (VENTOLIN HFA) 108 (90 Base) MCG/ACT inhaler Inhale 2 puffs into the lungs every 6 (six) hours as needed for wheezing or shortness of breath. 1 g 2   amLODipine (NORVASC) 5 MG tablet Take 1 tablet (5 mg total) by mouth daily. D/c metoprolol 90 tablet 1   Aspirin-Acetaminophen-Caffeine (EXCEDRIN PO) Take by mouth. Prn     azithromycin (ZITHROMAX Z-PAK) 250 MG tablet As directed 6 each 0    EPINEPHrine 0.3 mg/0.3 mL IJ SOAJ injection Inject 0.3 mg into the muscle as needed for anaphylaxis. 1 each 1   famotidine (PEPCID) 20 MG tablet Take 1 tablet (20 mg total) by mouth daily. 30 tablet 0   fexofenadine (ALLEGRA) 60 MG tablet Take 60 mg by mouth 2 (two) times daily.     fluticasone furoate-vilanterol (BREO ELLIPTA) 200-25 MCG/ACT AEPB Inhale 1 puff into the lungs daily. 1 each 11   hydrochlorothiazide (HYDRODIURIL) 25 MG tablet Take 1 tablet (25 mg total) by mouth daily. 90 tablet 3   ipratropium (ATROVENT) 0.06 % nasal spray Place 2 sprays into both nostrils 4 (four) times daily. 15 mL 12   metoprolol succinate (TOPROL-XL) 50 MG 24 hr tablet Take 1 tablet (50 mg total) by mouth daily. 90 tablet 3   montelukast (SINGULAIR) 10 MG tablet Take 10 mg by mouth at bedtime.     nystatin (MYCOSTATIN) 100000 UNIT/ML suspension Take 5 mLs (500,000 Units total) by mouth 4 (four) times daily. 60[place]  mL 0   pantoprazole (PROTONIX) 40 MG tablet Take 1 tablet (40 mg total) by mouth 2 (two) times daily. 60 tablet 3   predniSONE (DELTASONE) 10 MG tablet TAKE 3 TABLETS PO QD FOR 3 DAYS THEN TAKE 2 TABLETS PO QD FOR 3 DAYS THEN TAKE 1 TABLET PO QD FOR 3 DAYS THEN TAKE 1/2 TAB PO QD FOR 3 DAYS 20 tablet 0   promethazine (PHENERGAN) 25 MG tablet Take 1 tablet (25 mg total) by mouth every 6 (six) hours as needed for nausea or vomiting. 30 tablet 2   terbinafine (LAMISIL) 250 MG tablet Take 250 mg by mouth daily.     valACYclovir (VALTREX) 1000 MG tablet Take 1 tablet by mouth twice daily 30 tablet 0   Vitamin D, Ergocalciferol, (DRISDOL) 1.25 MG (50000 UNIT) CAPS capsule Take 1 capsule (50,000 Units total) by mouth every 7 (seven) days. 12 capsule 1   diclofenac (VOLTAREN) 75 MG EC tablet Take 1 tablet (75 mg total) by mouth 2 (two) times daily with a meal. 30 tablet 2   No current facility-administered medications for this visit.   Allergies: Allergies  Allergen Reactions   Dulera [Mometasone  Furo-Formoterol Fum] Hives   Influenza Vaccines     "itching"   Amoxil [Amoxicillin] Swelling   I reviewed her past medical history, social history, family history, and environmental history and no significant changes have been reported from her previous visit.  ROS: All others negative except as noted per HPI.   Objective: BP 124/76   Pulse 82   Temp 97.9 F (36.6 C) (Temporal)   Resp 18   Wt 253 lb 11.2 oz (115.1 kg)   LMP 03/16/2022 (Exact Date)   SpO2 99%   BMI 43.55 kg/m  Body mass index is 43.55 kg/m. General Appearance:  Alert, cooperative, no distress, appears stated age  Head:  Normocephalic, without obvious abnormality, atraumatic  Eyes:  Conjunctiva clear, EOM's intact  Nose: Nares normal,  clear rhinorrhea, hypertrophic turbinates, no visible anterior polyps, and septum midline  Throat: Lips, tongue normal; teeth and gums normal,  white patches on upper palate, no tonsillar exudate, and + cobblestoning  Neck: Supple, symmetrical  Lungs:   clear to auscultation bilaterally, Respirations unlabored, no coughing  Heart:  regular rate and rhythm and no murmur, Appears well perfused  Extremities: No edema  Skin: Skin color, texture, turgor normal, no rashes or lesions on visualized portions of skin   Neurologic: No gross deficits   Previous notes and tests were reviewed. The plan was reviewed with the patient/family, and all questions/concerned were addressed.  It was my pleasure to see Tramaine today and participate in her care. Please feel free to contact me with any questions or concerns.  Sincerely,  Roney Marion, MD  Allergy & Immunology  [facility] Office: [phone]

## 2022-04-20 NOTE — Telephone Encounter (Signed)
OV

## 2022-04-20 NOTE — Addendum Note (Signed)
Addended by: Felipa Emory on: 04/20/2022 01:56 PM   Modules accepted: Orders

## 2022-04-20 NOTE — Telephone Encounter (Signed)
Pt called requesting the following Rx sent into the following pharmacy:  Diflucan  [id number] - [place], [place] - [address] AT [address] [place], [place] [address] Phone: [phone]  Fax: [phone]

## 2022-04-20 NOTE — Patient Instructions (Addendum)
Allergic Rhinitis  Previous testing 09/29/2021: Positive to grass, dust mites, roach Continue Ipatropium nasal spray: 1 spray per nostril as needed for runny nose  Use saline nasal spray as needed.    Urticaria  Continue Allergra $RemoveBeforeDEI'180mg'BsuMzKvihtgZMSns$  twice day  Hive labs: Microcytic anemia,, low TSH, elevated ESR and very positive CU index - for SKIN only reaction, okay to take Benadryl 25  mg  every 6 hours - for SKIN + ANY additional symptoms, OR IF concern for LIFE THREATENING reaction = Epipen Autoinjector EpiPen 0.3 mg. - If using Epinephrine autoinjector, call 911 - A food allergy action plan has been provided and discussed. - Medic Alert identification is recommended.   Asthma: Breathing test today looked ok Given this is your third prednisone course this year we will look at Biologics for poorly controlled asthma Get labs 2 weeks after you finish your current prednisone course for rest evaluate which Biologics are an option We discussed Dupixent and Xolair is the best options for you as these will likely help your chronic hives as well  -Patient handouts given on both Dupixent and Xolair today Daily controller medication(s): Breo 254mcg 1 puff  a day and rinse mouth after each use.  May use albuterol rescue inhaler 2 puffs or nebulizer every 4 to 6 hours as needed for shortness of breath, chest tightness, coughing, and wheezing. May use albuterol rescue inhaler 2 puffs 5 to 15 minutes prior to strenuous physical activities. Monitor frequency of use.  Asthma control goals:  Full participation in all desired activities (may need albuterol before activity) Albuterol use two times or less a week on average (not counting use with activity) Cough interfering with sleep two times or less a month Oral steroids no more than once a year No hospitalizations   Thrush - Start nystatin swish and swallow 4 times a day  - Wash mouth out after inhaler use   Heartburn: Continue lifestyle and dietary  modifications Continue Protonix $RemoveBeforeDEI'40mg'qslVpssCuCnsTUgS$  twice a day as prescribed. Continue Pepcid $RemoveBeforeD'20mg'SgBNbhroYmNCFB$  as prescribed. Follow up with GI regarding this.   Follow up:  we will contact you with biologic options, Once I get the lab results I will contact our Biologic coordinator Tammy VonCannon who coordinate approval process Follow-up: After starting biologic I would like to see you in clinic in 3 months  Thank you so much for letting me partake in your care today.  Don't hesitate to reach out if you have any additional concerns!  Roney Marion, MD  [facility] [place] [place]

## 2022-04-21 ENCOUNTER — Other Ambulatory Visit: Payer: Self-pay | Admitting: Family Medicine

## 2022-04-21 MED ORDER — FLUCONAZOLE 150 MG PO TABS: ORAL_TABLET | ORAL | 0 refills | Status: DC

## 2022-04-21 NOTE — Telephone Encounter (Signed)
Pt made aware

## 2022-04-28 ENCOUNTER — Other Ambulatory Visit: Payer: BC Managed Care – PPO

## 2022-05-05 ENCOUNTER — Other Ambulatory Visit (INDEPENDENT_AMBULATORY_CARE_PROVIDER_SITE_OTHER): Payer: BC Managed Care – PPO

## 2022-05-05 ENCOUNTER — Ambulatory Visit (INDEPENDENT_AMBULATORY_CARE_PROVIDER_SITE_OTHER): Payer: BC Managed Care – PPO | Admitting: *Deleted

## 2022-05-05 DIAGNOSIS — D649 Anemia, unspecified: Secondary | ICD-10-CM | POA: Diagnosis not present

## 2022-05-05 DIAGNOSIS — E538 Deficiency of other specified B group vitamins: Secondary | ICD-10-CM | POA: Diagnosis not present

## 2022-05-05 DIAGNOSIS — R7989 Other specified abnormal findings of blood chemistry: Secondary | ICD-10-CM

## 2022-05-05 MED ORDER — CYANOCOBALAMIN 1000 MCG/ML IJ SOLN
1000.0000 ug | Freq: Once | INTRAMUSCULAR | Status: AC
  Administered 2022-05-05: 1000 ug via INTRAMUSCULAR

## 2022-05-05 NOTE — Addendum Note (Signed)
Addended by: Manuela Schwartz on: 05/05/2022 03:16 PM   Modules accepted: Orders

## 2022-05-05 NOTE — Addendum Note (Signed)
Addended by: Kelle Darting A on: 05/05/2022 03:53 PM   Modules accepted: Orders

## 2022-05-05 NOTE — Addendum Note (Signed)
Addended by: Kelle Darting A on: 05/05/2022 03:39 PM   Modules accepted: Orders

## 2022-05-05 NOTE — Progress Notes (Signed)
Patient here for monthly b12 injection, per physician's orders.  Original order: 03/08/22: "B12 low--- may benefit from b12 weekly x4 then monthly "  B12 1062mcg given IM was administered L Deltoid (location) today. Patient tolerated injection.  Next appointment scheduled for 06/07/22.

## 2022-05-06 LAB — TSH: TSH: 0.74 u[IU]/mL (ref 0.35–5.50)

## 2022-05-19 ENCOUNTER — Encounter: Payer: Self-pay | Admitting: Family Medicine

## 2022-05-19 ENCOUNTER — Ambulatory Visit (INDEPENDENT_AMBULATORY_CARE_PROVIDER_SITE_OTHER): Payer: BC Managed Care – PPO | Admitting: Family Medicine

## 2022-05-19 ENCOUNTER — Other Ambulatory Visit (HOSPITAL_COMMUNITY)
Admission: RE | Admit: 2022-05-19 | Discharge: 2022-05-19 | Disposition: A | Discharge status: Home / Self Care | Payer: BC Managed Care – PPO | Source: Ambulatory Visit | Attending: Family Medicine | Admitting: Family Medicine

## 2022-05-19 VITALS — BP 127/75 | HR 97 | Ht 64.0 in | Wt 257.0 lb

## 2022-05-19 DIAGNOSIS — Z01419 Encounter for gynecological examination (general) (routine) without abnormal findings: Secondary | ICD-10-CM | POA: Diagnosis not present

## 2022-05-19 NOTE — Progress Notes (Signed)
ANNUAL EXAM Patient name: Sherry Villegas MRN [id number]  Date of birth: 1985-12-14 Chief Complaint:   Annual Exam  History of Present Illness:   Sherry Villegas is a 36 y.o.  G0P0000  female  being seen today for a routine annual exam.  Current complaints: her last two periods were light. Otherwise, normal menstrual interval.  Patient's last menstrual period was 05/08/2022 (exact date).    Last pap 2021. Results were: NILM w/ HRHPV negative. H/O abnormal pap: no Last mammogram: n/a     04/05/2022    6:07 PM 03/08/2022    1:43 PM 03/16/2021    9:58 AM 12/14/2020    4:02 PM 06/30/2020    1:50 PM  Depression screen PHQ 2/9  Decreased Interest 0 0 1 0 0  Down, Depressed, Hopeless 0 0 1 0 0  PHQ - 2 Score 0 0 2 0 0  Altered sleeping   0    Tired, decreased energy   3    Change in appetite   1    Feeling bad or failure about yourself    0    Trouble concentrating   0    Moving slowly or fidgety/restless   1    Suicidal thoughts   0    PHQ-9 Score   7    Difficult doing work/chores   Not difficult at all          06/30/2020    2:45 PM  GAD 7 : Generalized Anxiety Score  Nervous, Anxious, on Edge 3  Control/stop worrying 3  Worry too much - different things 3  Trouble relaxing 3  Restless 0  Easily annoyed or irritable 2  Afraid - awful might happen 3  Total GAD 7 Score 17  Anxiety Difficulty Very difficult     Review of Systems:   Pertinent items are noted in HPI Denies any headaches, blurred vision, fatigue, shortness of breath, chest pain, abdominal pain, abnormal vaginal discharge/itching/odor/irritation, problems with periods, bowel movements, urination, or intercourse unless otherwise stated above. Pertinent History Reviewed:  Reviewed past medical,surgical, social and family history.  Reviewed problem list, medications and allergies. Physical Assessment:   Vitals:   05/19/22 1523  BP: 127/75  Pulse: 97  Weight: 257 lb (116.6 kg)  Height: 5\' 4"  (1.626 m)   Body mass index is 44.11 kg/m.        Physical Examination:   General appearance - well appearing, and in no distress  Mental status - alert, oriented to person, place, and time  Psych:  She has a normal mood and affect  Skin - warm and dry, normal color, no suspicious lesions noted  Chest - effort normal, all lung fields clear to auscultation bilaterally  Heart - normal rate and regular rhythm  Neck:  midline trachea, no thyromegaly or nodules  Breasts - breasts appear normal, no suspicious masses, no skin or nipple changes or axillary nodes  Abdomen - soft, nontender, nondistended, no masses or organomegaly  Pelvic - VULVA: normal appearing vulva with no masses, tenderness or lesions  VAGINA: normal appearing vagina with normal color and discharge, no lesions  CERVIX: normal appearing cervix without discharge or lesions, no CMT  Thin prep pap is done with HR HPV cotesting  UTERUS: uterus is felt to be normal size, shape, consistency and nontender   ADNEXA: No adnexal masses or tenderness noted.  Extremities:  No swelling or varicosities noted  Chaperone present for exam  Assessment & Plan:  1. Well  woman exam with routine gynecological exam Although patient not due for PAP, she would like annual PAP. STI testing off PAP. Recommended UPT for light periods - offered in office, but patient will take at home. Getting an ovulation tracker. Hoping to get pregnant - Cytology - PAP( [place])   Labs/procedures today:   No orders of the defined types were placed in this encounter.   Meds: No orders of the defined types were placed in this encounter.   Follow-up: No follow-ups on file.  Truett Mainland, DO 05/19/2022 3:53 PM

## 2022-05-23 LAB — CYTOLOGY - PAP
Chlamydia: NEGATIVE
Comment: NEGATIVE
Comment: NORMAL
Diagnosis: NEGATIVE
Neisseria Gonorrhea: NEGATIVE

## 2022-05-24 ENCOUNTER — Telehealth: Payer: Self-pay

## 2022-05-24 ENCOUNTER — Telehealth: Payer: Self-pay | Admitting: Family Medicine

## 2022-05-24 MED ORDER — VALACYCLOVIR HCL 1 G PO TABS: 1000.0000 mg | ORAL_TABLET | Freq: Two times a day (BID) | ORAL | 2 refills | Status: DC

## 2022-05-24 NOTE — Telephone Encounter (Signed)
Medication:   valACYclovir (VALTREX) 1000 MG tablet [[id number]]   Has the patient contacted their pharmacy? No. (If no, request that the patient contact the pharmacy for the refill.) (If yes, when and what did the pharmacy advise?)  Preferred Pharmacy (with phone number or street name):   [id number] [place], [place] - [address] AT [address], [place] [address] Phone: [phone]  Fax: [phone]    Agent: Please be advised that RX refills may take up to 3 business days. We ask that you follow-up with your pharmacy.

## 2022-05-24 NOTE — Telephone Encounter (Signed)
Refill sent.

## 2022-05-24 NOTE — Telephone Encounter (Signed)
Caller Name Elleana Stillson Caller Phone Number [phone] Patient Name Sherry Villegas Patient DOB 12/05/1985 Call Type Message Only Information Provided Reason for Call Request for General Office Information Initial Comment Caller says that she changed to another pharmacy about a month ago. She is trying to request a refill on MyChart but it Awe showing the old pharmacy, and needs it changed. Declined triage. She has two pills left. Disp. Time Disposition Final User 05/23/2022 5:11:06 PM General Information Provided Yes Einar Pheasant Call Closed By: Einar Pheasant Transaction Date/Time: 05/23/2022 5:08:10 PM (ET)

## 2022-06-07 ENCOUNTER — Ambulatory Visit: Payer: BC Managed Care – PPO

## 2022-06-07 NOTE — Progress Notes (Deleted)
Patient here for monthly b12 injection, per physician's orders.   Original order: 03/08/22: "B12 low--- may benefit from b12 weekly x4 then monthly "   B12 given IM was administered L Deltoid (location) today. Patient tolerated injection.   Next appointment scheduled for .

## 2022-06-10 ENCOUNTER — Ambulatory Visit: Payer: BC Managed Care – PPO

## 2022-06-10 ENCOUNTER — Other Ambulatory Visit: Payer: BC Managed Care – PPO

## 2022-06-14 ENCOUNTER — Other Ambulatory Visit (INDEPENDENT_AMBULATORY_CARE_PROVIDER_SITE_OTHER): Payer: BC Managed Care – PPO

## 2022-06-14 ENCOUNTER — Ambulatory Visit (INDEPENDENT_AMBULATORY_CARE_PROVIDER_SITE_OTHER): Payer: BC Managed Care – PPO

## 2022-06-14 DIAGNOSIS — D649 Anemia, unspecified: Secondary | ICD-10-CM

## 2022-06-14 DIAGNOSIS — E538 Deficiency of other specified B group vitamins: Secondary | ICD-10-CM | POA: Diagnosis not present

## 2022-06-14 MED ORDER — CYANOCOBALAMIN 1000 MCG/ML IJ SOLN
1000.0000 ug | Freq: Once | INTRAMUSCULAR | Status: AC
  Administered 2022-06-14: 1000 ug via INTRAMUSCULAR

## 2022-06-14 NOTE — Progress Notes (Addendum)
Patient here for monthly b12 injection, per physician's orders.   Original order: 03/08/22: "B12 low--- may benefit from b12 weekly x4 then monthly "   B12 given IM was administered L Deltoid (location) today. Patient tolerated injection.   Next appointment scheduled for 07/06/22

## 2022-06-15 LAB — VITAMIN B12: Vitamin B-12: 430 pg/mL (ref 211–911)

## 2022-06-15 LAB — CBC WITH DIFFERENTIAL/PLATELET
Basophils Absolute: 0.1 10*3/uL (ref 0.0–0.1)
Basophils Relative: 0.8 % (ref 0.0–3.0)
Eosinophils Absolute: 0.2 10*3/uL (ref 0.0–0.7)
Eosinophils Relative: 1.8 % (ref 0.0–5.0)
HCT: 29.4 % — ABNORMAL LOW (ref 36.0–46.0)
Hemoglobin: 8.8 g/dL — ABNORMAL LOW (ref 12.0–15.0)
Lymphocytes Relative: 17.7 % (ref 12.0–46.0)
Lymphs Abs: 1.6 10*3/uL (ref 0.7–4.0)
MCHC: 30 g/dL (ref 30.0–36.0)
MCV: 65.5 fl — ABNORMAL LOW (ref 78.0–100.0)
Monocytes Absolute: 0.5 10*3/uL (ref 0.1–1.0)
Monocytes Relative: 5.7 % (ref 3.0–12.0)
Neutro Abs: 6.8 10*3/uL (ref 1.4–7.7)
Neutrophils Relative %: 74 % (ref 43.0–77.0)
Platelets: 629 10*3/uL — ABNORMAL HIGH (ref 150.0–400.0)
RBC: 4.48 Mil/uL (ref 3.87–5.11)
RDW: 19.4 % — ABNORMAL HIGH (ref 11.5–15.5)
WBC: 9.2 10*3/uL (ref 4.0–10.5)

## 2022-06-15 LAB — IBC + FERRITIN
Ferritin: 5.1 ng/mL — ABNORMAL LOW (ref 10.0–291.0)
Iron: 16 ug/dL — ABNORMAL LOW (ref 42–145)
Saturation Ratios: 3.1 % — ABNORMAL LOW (ref 20.0–50.0)
TIBC: 513.8 ug/dL — ABNORMAL HIGH (ref 250.0–450.0)
Transferrin: 367 mg/dL — ABNORMAL HIGH (ref 212.0–360.0)

## 2022-06-24 ENCOUNTER — Encounter: Payer: Self-pay | Admitting: Family Medicine

## 2022-06-29 ENCOUNTER — Telehealth: Payer: Self-pay | Admitting: Family Medicine

## 2022-06-29 DIAGNOSIS — I10 Essential (primary) hypertension: Secondary | ICD-10-CM

## 2022-06-29 NOTE — Telephone Encounter (Signed)
Pt called stating that following medications are not being covered by insurance for 30 days and need to be written for 90 days:  Prescription Request  06/29/2022  Is this a "Controlled Substance" medicine? No  LOV: 04/05/2022  What is the name of the medication or equipment?   hydrochlorothiazide (HYDRODIURIL) 25 MG tablet [[id number]]   Have you contacted your pharmacy to request a refill? Yes   Which pharmacy would you like this sent to?  [id number] [place], [place] - [address] AT [address] [place]  [address] Phone: [phone] Fax: [phone]    Patient notified that their request is being sent to the clinical staff for review and that they should receive a response within 2 business days.   Please advise at Mobile [phone] (mobile)

## 2022-06-30 MED ORDER — HYDROCHLOROTHIAZIDE 25 MG PO TABS: 25.0000 mg | ORAL_TABLET | Freq: Every day | ORAL | 1 refills | Status: DC

## 2022-06-30 NOTE — Telephone Encounter (Signed)
Refill sent.

## 2022-07-06 ENCOUNTER — Ambulatory Visit: Payer: BC Managed Care – PPO

## 2022-07-08 ENCOUNTER — Ambulatory Visit (INDEPENDENT_AMBULATORY_CARE_PROVIDER_SITE_OTHER): Payer: BC Managed Care – PPO

## 2022-07-08 DIAGNOSIS — E538 Deficiency of other specified B group vitamins: Secondary | ICD-10-CM

## 2022-07-08 MED ORDER — CYANOCOBALAMIN 1000 MCG/ML IJ SOLN
1000.0000 ug | Freq: Once | INTRAMUSCULAR | Status: AC
  Administered 2022-07-08: 1000 ug via INTRAMUSCULAR

## 2022-07-08 NOTE — Progress Notes (Signed)
Pt here for 3/5  monthly B12 injection per Dr. Zola Button  B12 given IM left deltoid, and pt tolerated injection well.  Next B12 injection scheduled for 08/10/2022

## 2022-07-08 NOTE — Telephone Encounter (Signed)
Pt came in for b12 shot and did a preg at GYN and it was negative. Pt thinks it is the blood pressure medicine

## 2022-07-19 ENCOUNTER — Other Ambulatory Visit: Payer: Self-pay | Admitting: Family Medicine

## 2022-07-19 ENCOUNTER — Telehealth: Payer: Self-pay

## 2022-07-19 NOTE — Telephone Encounter (Signed)
Patient states she had very light period in November. Patient had light bleeding on 07-08-23. Patient had full blown period on 07/14/2022 and has extreme nausea with her period.   Patient wondering if blood pressure medications.

## 2022-07-20 ENCOUNTER — Encounter: Payer: Self-pay | Admitting: Family Medicine

## 2022-07-20 ENCOUNTER — Ambulatory Visit (INDEPENDENT_AMBULATORY_CARE_PROVIDER_SITE_OTHER): Payer: BC Managed Care – PPO | Admitting: Family Medicine

## 2022-07-20 VITALS — BP 110/65 | HR 101 | Wt 254.0 lb

## 2022-07-20 DIAGNOSIS — N939 Abnormal uterine and vaginal bleeding, unspecified: Secondary | ICD-10-CM | POA: Diagnosis not present

## 2022-07-20 MED ORDER — PROCHLORPERAZINE MALEATE 10 MG PO TABS: 10.0000 mg | ORAL_TABLET | Freq: Four times a day (QID) | ORAL | 6 refills | Status: DC | PRN

## 2022-07-20 NOTE — Progress Notes (Signed)
Patient states she has had a period twice this month. She states the first period started on 07/01/22-07/07/22 and the second cycle 07/17/22-07/20/22.  Sherry Villegas l Regginald Pask, CMA

## 2022-07-20 NOTE — Progress Notes (Signed)
   Subjective:    Patient ID: Sherry Villegas, female    DOB: 1985-11-10, 37 y.o.   MRN: [id number]  HPI  Patient seen for abnormal bleeding.  She has been having light menses over the past 3.  She had a period at the end of December, had no bleeding for a week to 10 days, then restarted her period.  This period was quite heavy with a lot of cramping and nausea/vomiting.  She is now spotting and [place] cramping.  Review of Systems     Objective:   Physical Exam Vitals reviewed.  Constitutional:      Appearance: Normal appearance.  Abdominal:     General: Abdomen is flat. There is no distension.     Palpations: Abdomen is soft.     Tenderness: There is no abdominal tenderness.  Skin:    General: Skin is warm.     Capillary Refill: Capillary refill takes less than 2 seconds.  Neurological:     General: No focal deficit present.     Mental Status: She is alert.  Psychiatric:        Mood and Affect: Mood normal.        Behavior: Behavior normal.        Thought Content: Thought content normal.        Judgment: Judgment normal.        Assessment & Plan:  1. Abnormal uterine bleeding (AUB) Will watch for now.  Discussed that this can be fairly normal to have episode of bleeding.  Recommended that the patient track her next few cycles and to contact me with any developing pattern

## 2022-07-21 DIAGNOSIS — Z8619 Personal history of other infectious and parasitic diseases: Secondary | ICD-10-CM | POA: Diagnosis not present

## 2022-07-21 DIAGNOSIS — K219 Gastro-esophageal reflux disease without esophagitis: Secondary | ICD-10-CM | POA: Diagnosis not present

## 2022-07-21 DIAGNOSIS — Z8711 Personal history of peptic ulcer disease: Secondary | ICD-10-CM | POA: Diagnosis not present

## 2022-07-24 IMAGING — DX DG CHEST 2V
2 series · 2 of 2 positions shown · non-contrast
Comparison: None.

CLINICAL DATA: Cough.  History of asthma.

EXAM:
CHEST - 2 VIEW

[chest pa]
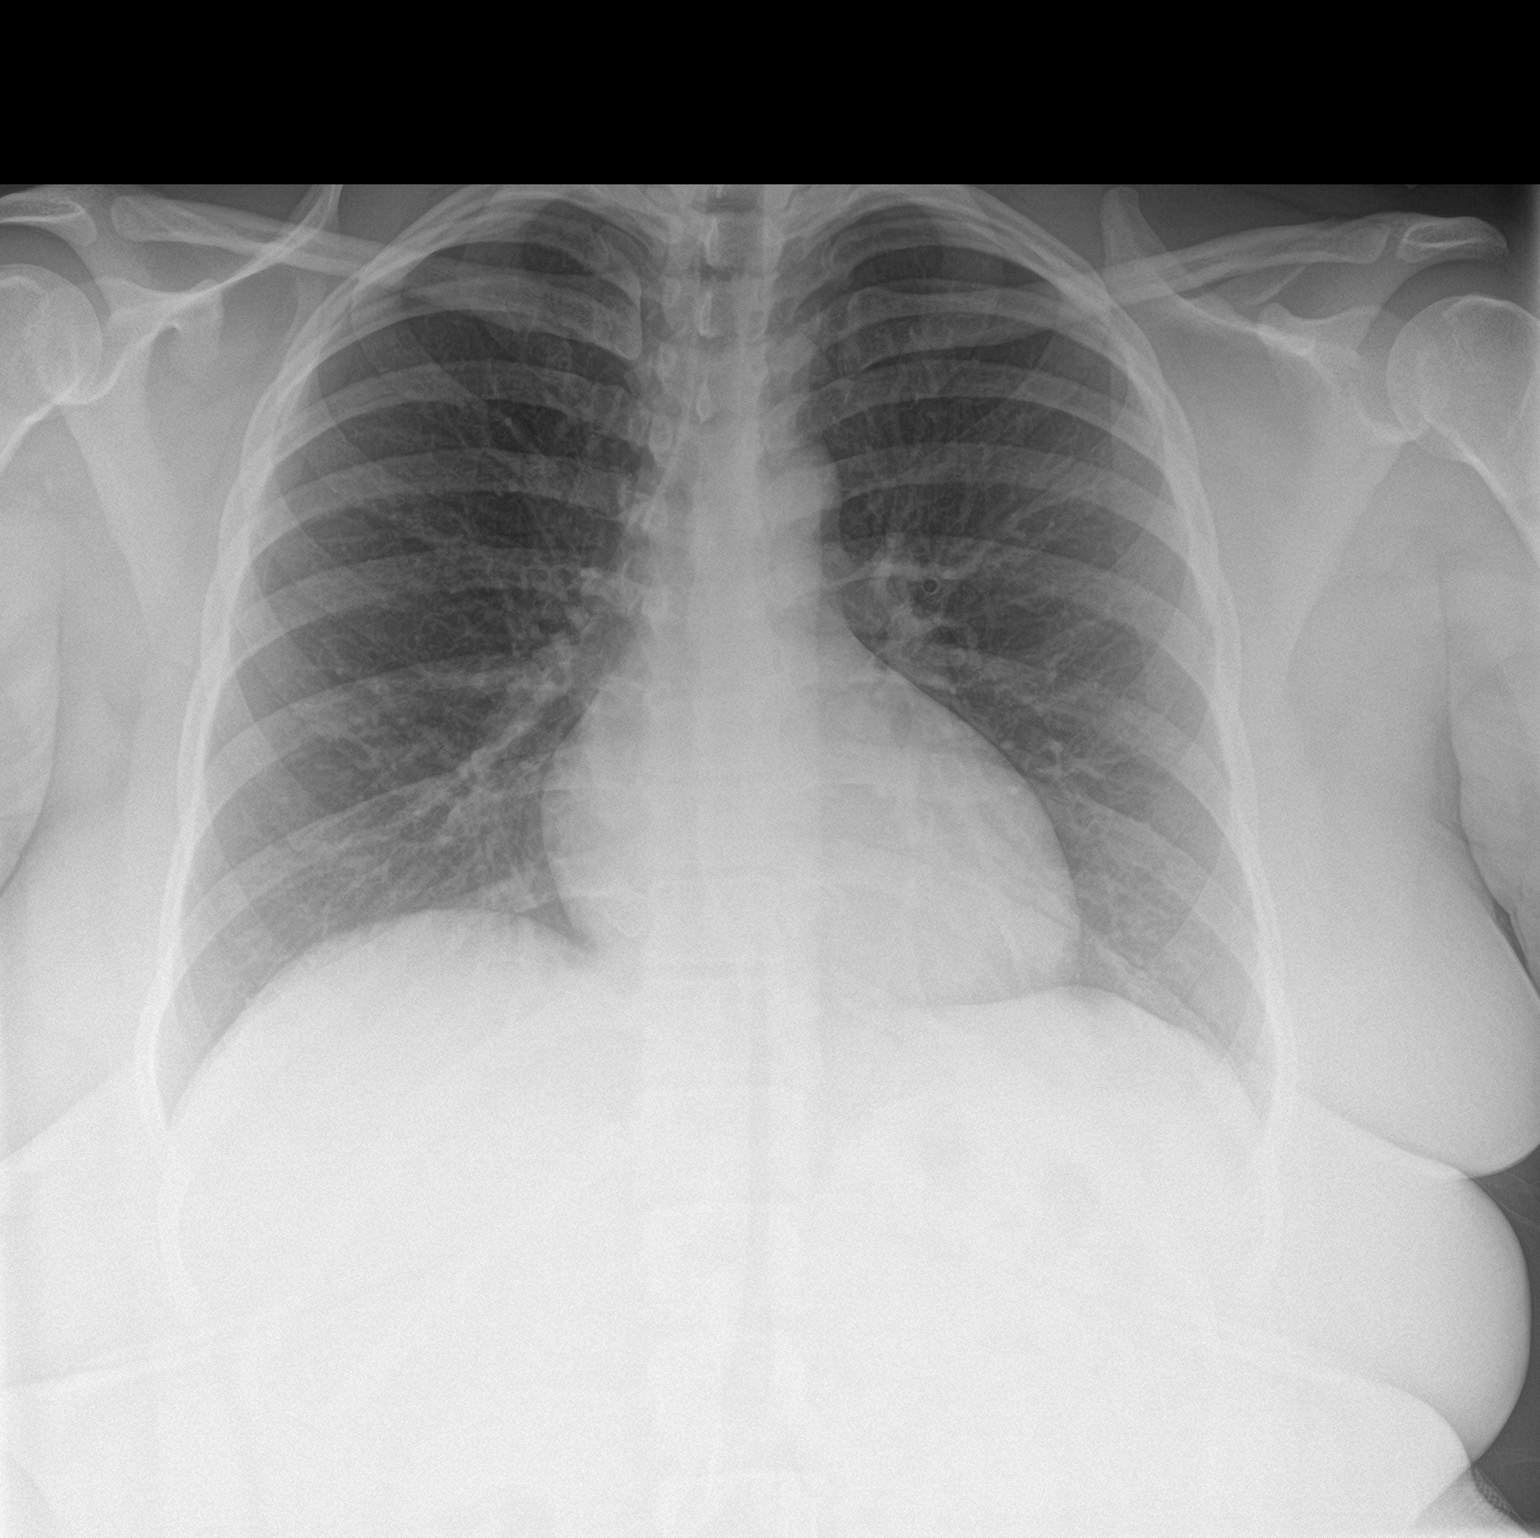

[chest lat]
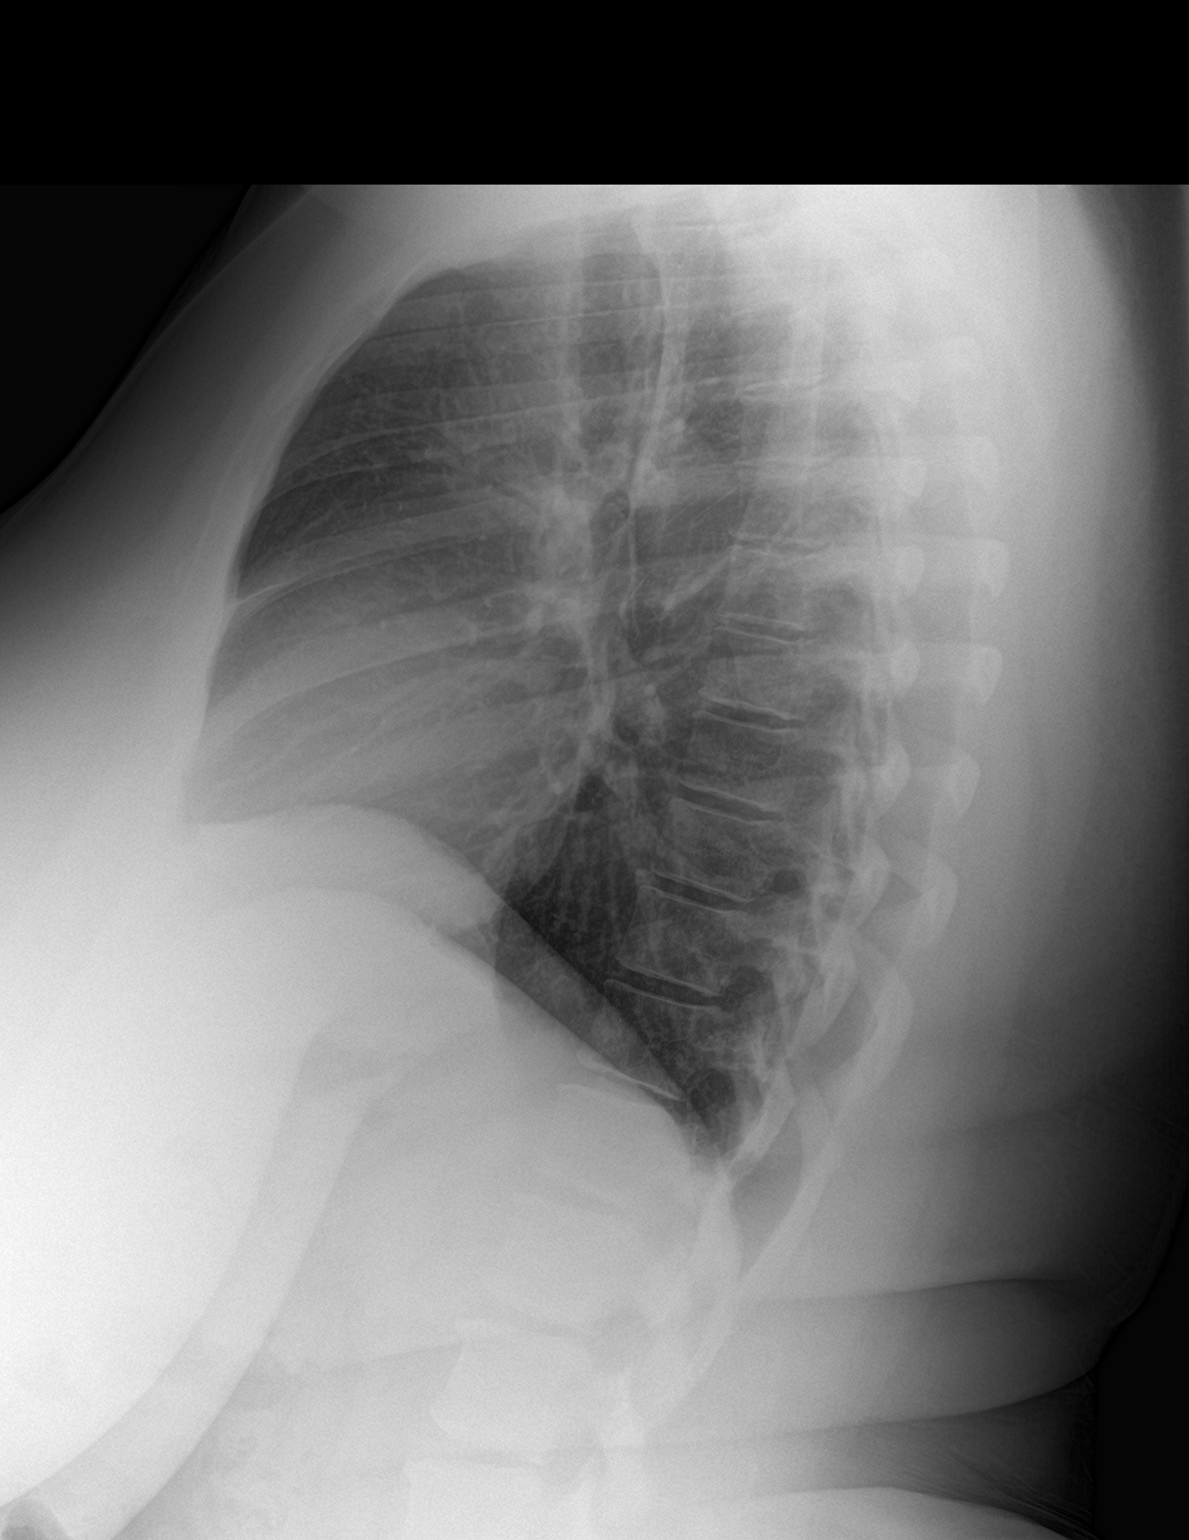

[2 of 2 positions shown; findings below may reference images not displayed]

FINDINGS: The cardiomediastinal contours are normal. Mild bronchial
thickening. Pulmonary vasculature is normal. No consolidation,
pleural effusion, or pneumothorax. No acute osseous abnormalities
are seen.
IMPRESSION: Mild bronchial thickening can be seen with asthma or bronchitis.

## 2022-08-10 ENCOUNTER — Ambulatory Visit: Payer: BC Managed Care – PPO

## 2022-08-10 NOTE — Progress Notes (Deleted)
Patient here for monthly b12 injection per physicians order.  Injection given in left deltoid and patient tolerated well.

## 2022-08-12 ENCOUNTER — Ambulatory Visit (INDEPENDENT_AMBULATORY_CARE_PROVIDER_SITE_OTHER): Payer: BC Managed Care – PPO

## 2022-08-12 DIAGNOSIS — E538 Deficiency of other specified B group vitamins: Secondary | ICD-10-CM

## 2022-08-12 MED ORDER — CYANOCOBALAMIN 1000 MCG/ML IJ SOLN
1000.0000 ug | Freq: Once | INTRAMUSCULAR | Status: AC
  Administered 2022-08-12: 1000 ug via INTRAMUSCULAR

## 2022-08-12 NOTE — Progress Notes (Signed)
Pt here for 4/5 monthly B12 injection per Dr. Carollee Herter  B12 1078mcg given IM left deltoid, and pt tolerated injection well.  Next B12 injection scheduled for /2024

## 2022-09-09 ENCOUNTER — Ambulatory Visit: Payer: BC Managed Care – PPO

## 2022-09-09 ENCOUNTER — Ambulatory Visit (INDEPENDENT_AMBULATORY_CARE_PROVIDER_SITE_OTHER): Payer: BC Managed Care – PPO | Admitting: Family Medicine

## 2022-09-09 ENCOUNTER — Encounter: Payer: Self-pay | Admitting: Family Medicine

## 2022-09-09 VITALS — BP 110/80 | HR 101 | Temp 98.0°F | Resp 22 | Ht 64.0 in | Wt 253.8 lb

## 2022-09-09 DIAGNOSIS — R5383 Other fatigue: Secondary | ICD-10-CM | POA: Diagnosis not present

## 2022-09-09 DIAGNOSIS — D649 Anemia, unspecified: Secondary | ICD-10-CM | POA: Insufficient documentation

## 2022-09-09 DIAGNOSIS — E538 Deficiency of other specified B group vitamins: Secondary | ICD-10-CM | POA: Diagnosis not present

## 2022-09-09 DIAGNOSIS — R112 Nausea with vomiting, unspecified: Secondary | ICD-10-CM | POA: Diagnosis not present

## 2022-09-09 DIAGNOSIS — I10 Essential (primary) hypertension: Secondary | ICD-10-CM | POA: Diagnosis not present

## 2022-09-09 DIAGNOSIS — N946 Dysmenorrhea, unspecified: Secondary | ICD-10-CM

## 2022-09-09 DIAGNOSIS — R7989 Other specified abnormal findings of blood chemistry: Secondary | ICD-10-CM

## 2022-09-09 DIAGNOSIS — D508 Other iron deficiency anemias: Secondary | ICD-10-CM

## 2022-09-09 MED ORDER — METOPROLOL SUCCINATE ER 25 MG PO TB24: 25.0000 mg | ORAL_TABLET | Freq: Every day | ORAL | 3 refills | Status: DC

## 2022-09-09 MED ORDER — CYANOCOBALAMIN 1000 MCG/ML IJ SOLN
1000.0000 ug | Freq: Once | INTRAMUSCULAR | Status: AC
  Administered 2022-09-09: 1000 ug via INTRAMUSCULAR

## 2022-09-09 MED ORDER — PROMETHAZINE HCL 50 MG/ML IJ SOLN
50.0000 mg | Freq: Once | INTRAMUSCULAR | Status: AC
  Administered 2022-09-09: 50 mg via INTRAMUSCULAR

## 2022-09-09 NOTE — Assessment & Plan Note (Signed)
Check labs today.

## 2022-09-09 NOTE — Assessment & Plan Note (Signed)
Well controlled, no changes to meds. Encouraged heart healthy diet such as the DASH diet and exercise as tolerated.  Check labs.  Adjust meds prn

## 2022-09-09 NOTE — Assessment & Plan Note (Signed)
With violent vomiting --- witnessed in office Phenergan 50 mg Im was given in office  Pt was able to drink some juice in office

## 2022-09-09 NOTE — Assessment & Plan Note (Signed)
Check blood today

## 2022-09-09 NOTE — Assessment & Plan Note (Signed)
Pt has violent vomiting the 1st 3 days of her period

## 2022-09-09 NOTE — Patient Instructions (Signed)
Fatigue If you have fatigue, you feel tired all the time and have a lack of energy or a lack of motivation. Fatigue may make it difficult to start or complete tasks because of exhaustion. Occasional or mild fatigue is often a normal response to activity or life. However, long-term (chronic) or extreme fatigue may be a symptom of a medical condition such as: Depression. Not having enough red blood cells or hemoglobin in the blood (anemia). A problem with a small gland located in the lower front part of the neck (thyroid disorder). Rheumatologic conditions. These are problems related to the body's defense system (immune system). Infections, especially certain viral infections. Fatigue can also lead to negative health outcomes over time. Follow these instructions at home: Medicines Take over-the-counter and prescription medicines only as told by your health care provider. Take a multivitamin if told by your health care provider. Do not use herbal or dietary supplements unless they are approved by your health care provider. Eating and drinking  Avoid heavy meals in the evening. Eat a well-balanced diet, which includes lean proteins, whole grains, plenty of fruits and vegetables, and low-fat dairy products. Avoid eating or drinking too many products with caffeine in them. Avoid alcohol. Drink enough fluid to keep your urine pale yellow. Activity  Exercise regularly, as told by your health care provider. Use or practice techniques to help you relax, such as yoga, tai chi, meditation, or massage therapy. Lifestyle Change situations that cause you stress. Try to keep your work and personal schedules in balance. Do not use recreational or illegal drugs. General instructions Monitor your fatigue for any changes. Go to bed and get up at the same time every day. Avoid fatigue by pacing yourself during the day and getting enough sleep at night. Maintain a healthy weight. Contact a health care  provider if: Your fatigue does not get better. You have a fever. You suddenly lose or gain weight. You have headaches. You have trouble falling asleep or sleeping through the night. You feel angry, guilty, anxious, or sad. You have swelling in your legs or another part of your body. Get help right away if: You feel confused, feel like you might faint, or faint. Your vision is blurry or you have a severe headache. You have severe pain in your abdomen, your back, or the area between your waist and hips (pelvis). You have chest pain, shortness of breath, or an irregular or fast heartbeat. You are unable to urinate, or you urinate less than normal. You have abnormal bleeding from the rectum, nose, lungs, nipples, or, if you are female, the vagina. You vomit blood. You have thoughts about hurting yourself or others. These symptoms may be an emergency. Get help right away. Call 911. Do not wait to see if the symptoms will go away. Do not drive yourself to the hospital. Get help right away if you feel like you may hurt yourself or others, or have thoughts about taking your own life. Go to your nearest emergency room or: Call 911. Call the National Suicide Prevention Lifeline at 1-800-273-8255 or 988. This is open 24 hours a day. Text the Crisis Text Line at 741741. Summary If you have fatigue, you feel tired all the time and have a lack of energy or a lack of motivation. Fatigue may make it difficult to start or complete tasks because of exhaustion. Long-term (chronic) or extreme fatigue may be a symptom of a medical condition. Exercise regularly, as told by your health care provider.   Change situations that cause you stress. Try to keep your work and personal schedules in balance. This information is not intended to replace advice given to you by your health care provider. Make sure you discuss any questions you have with your health care provider. Document Revised: 04/19/2021 Document  Reviewed: 04/19/2021 Elsevier Patient Education  2023 Elsevier Inc.  

## 2022-09-09 NOTE — Progress Notes (Signed)
Subjective:   By signing my name below, I, Marlana Latus, attest that this documentation has been prepared under the direction and in the presence of Ann Held, DO 09/09/22   Patient ID: Sherry Villegas, female    DOB: 10-22-1985, 37 y.o.   MRN: [id number]  Chief Complaint  Patient presents with   Nausea    HPI Patient is in today for a follow up. She is accompanied by her partner.  She presents with nausea and vomiting attributed to her being on her period. She reports that this is common for her on the first 3 days of her period, when her cramps are at their worst. Her partner notes that lying down tends to worsen her symptoms. She tends to take Tylenol and Excedrin to resolve pain and it wears off in about 2.5 hours. She also takes nausea medication, but it does not help much.   She has been following up with her gynecologist about every other month. She reports having had to take days off of work due to her periods. She notes she bled twice in the last month. She has previously tried birth control but reported she bled for a whole month straight. She is currently not on birth control and is trying for pregnancy.   She has been monitoring her blood pressure at home and reports readings of 107/75, 111/75, and as high as 135/80. She reports her Amlodipine dose was increased and she is back on Metoprolol. She is compliant with hydrochlorothiazide 25 daily. She reports she tends to take her antihypertensives at 7-7:30 am and by 9 am she feels very sleepy.   Past Medical History:  Diagnosis Date   Anemia    Anxiety    Asthma    Back pain    Constipation    GERD (gastroesophageal reflux disease)    Hyperlipidemia    Hypertension    Migraine    Stomach ulcer    Vaginal Pap smear, abnormal     No past surgical history on file.  Family History  Problem Relation Age of Onset   Heart failure Mother    Hypertension Mother    Heart disease Mother 12       MI   Stroke  Mother    Sudden death Mother    Cancer Father    Heart failure Father    Stroke Father    Hypertension Father    Diabetes Father    Heart attack Father    Dementia Father    Breast cancer Maternal Grandmother    Breast cancer Paternal Grandmother    Breast cancer Maternal Aunt     Social History   Socioeconomic History   Marital status: Single    Spouse name: Not on file   Number of children: Not on file   Years of education: Not on file   Highest education level: Associate degree: occupational, Hotel manager, or vocational program  Occupational History   Occupation: customer service rep    Occupation: Customer service rep  Tobacco Use   Smoking status: Every Day    Packs/day: 0.25    Years: 15.00    Total pack years: 3.75    Types: Cigarettes   Smokeless tobacco: Never   Tobacco comments:    Currently smoking about 5cigs per day as of 08/31/21 ep  Vaping Use   Vaping Use: Never used  Substance and Sexual Activity   Alcohol use: Yes    Alcohol/week: 6.0 standard drinks of alcohol  Types: 6 Shots of liquor per week   Drug use: Yes    Types: Marijuana    Comment: special occassions only-- 1 x q6 months   Sexual activity: Yes    Partners: Male  Other Topics Concern   Not on file  Social History Narrative   Exercise--- no due to asthma   Social Determinants of Health   Financial Resource Strain: Not on file  Food Insecurity: Not on file  Transportation Needs: Not on file  Physical Activity: Not on file  Stress: Not on file  Social Connections: Not on file  Intimate Partner Violence: Not on file    Outpatient Medications Prior to Visit  Medication Sig Dispense Refill   acetaminophen (TYLENOL) 325 MG tablet Take 650 mg by mouth every 6 (six) hours as needed.     albuterol (VENTOLIN HFA) 108 (90 Base) MCG/ACT inhaler Inhale 2 puffs into the lungs every 6 (six) hours as needed for wheezing or shortness of breath. 1 g 2   amLODipine (NORVASC) 5 MG tablet Take 1  tablet (5 mg total) by mouth daily. D/c metoprolol 90 tablet 1   Aspirin-Acetaminophen-Caffeine (EXCEDRIN PO) Take by mouth. Prn     diclofenac (VOLTAREN) 75 MG EC tablet Take 1 tablet (75 mg total) by mouth 2 (two) times daily with a meal. 30 tablet 2   EPINEPHrine 0.3 mg/0.3 mL IJ SOAJ injection Inject 0.3 mg into the muscle as needed for anaphylaxis. 1 each 1   famotidine (PEPCID) 20 MG tablet Take 1 tablet (20 mg total) by mouth daily. 30 tablet 0   fexofenadine (ALLEGRA) 60 MG tablet Take 60 mg by mouth 2 (two) times daily.     hydrochlorothiazide (HYDRODIURIL) 25 MG tablet Take 1 tablet (25 mg total) by mouth daily. 90 tablet 1   pantoprazole (PROTONIX) 40 MG tablet Take 1 tablet (40 mg total) by mouth 2 (two) times daily. 60 tablet 3   prochlorperazine (COMPAZINE) 10 MG tablet Take 1 tablet (10 mg total) by mouth every 6 (six) hours as needed for nausea or vomiting. 30 tablet 6   valACYclovir (VALTREX) 1000 MG tablet Take 1 tablet (1,000 mg total) by mouth 2 (two) times daily. 60 tablet 2   Vitamin D, Ergocalciferol, (DRISDOL) 1.25 MG (50000 UNIT) CAPS capsule Take 1 capsule (50,000 Units total) by mouth every 7 (seven) days. 12 capsule 1   metoprolol succinate (TOPROL-XL) 50 MG 24 hr tablet Take 1 tablet (50 mg total) by mouth daily. 90 tablet 3   No facility-administered medications prior to visit.    Allergies  Allergen Reactions   Dulera [Mometasone Furo-Formoterol Fum] Hives   Influenza Vaccines     "itching"   Amoxil [Amoxicillin] Swelling    Review of Systems  Constitutional:  Negative for fever and malaise/fatigue.  HENT:  Negative for congestion.   Eyes:  Negative for blurred vision.  Respiratory:  Negative for shortness of breath.   Cardiovascular:  Negative for chest pain, palpitations and leg swelling.  Gastrointestinal:  Positive for nausea (attributed to her period) and vomiting (attributed to her period). Negative for abdominal pain and blood in stool.   Genitourinary:  Negative for dysuria and frequency.  Musculoskeletal:  Negative for falls.  Skin:  Negative for rash.  Neurological:  Negative for dizziness, loss of consciousness and headaches.  Endo/Heme/Allergies:  Negative for environmental allergies.  Psychiatric/Behavioral:  Negative for depression. The patient is not nervous/anxious.        Objective:    Physical  Exam Vitals and nursing note reviewed.  Constitutional:      Appearance: She is well-developed.  HENT:     Head: Normocephalic and atraumatic.  Eyes:     Conjunctiva/sclera: Conjunctivae normal.  Neck:     Thyroid: No thyromegaly.     Vascular: No carotid bruit or JVD.  Cardiovascular:     Rate and Rhythm: Normal rate and regular rhythm.     Heart sounds: Normal heart sounds. No murmur heard. Pulmonary:     Effort: Pulmonary effort is normal. No respiratory distress.     Breath sounds: Normal breath sounds. No wheezing or rales.  Chest:     Chest wall: No tenderness.  Musculoskeletal:     Cervical back: Normal range of motion and neck supple.  Neurological:     Mental Status: She is alert and oriented to person, place, and time.     BP 110/80 (BP Location: Left Arm, Patient Position: Sitting, Cuff Size: Large)   Pulse (!) 101   Temp 98 F (36.7 C) (Oral)   Resp (!) 22   Ht '5\' 4"'$  (1.626 m)   Wt 253 lb 12.8 oz (115.1 kg)   LMP 09/08/2022   SpO2 99%   BMI 43.56 kg/m  Wt Readings from Last 3 Encounters:  09/09/22 253 lb 12.8 oz (115.1 kg)  07/20/22 254 lb (115.2 kg)  05/19/22 257 lb (116.6 kg)       Assessment & Plan:  Nausea and vomiting, unspecified vomiting type Assessment & Plan: Pt has violent vomiting the 1st 3 days of her period   Orders: -     Promethazine HCl  B12 deficiency Assessment & Plan: Check labs today  Orders: -     Cyanocobalamin  Anemia, unspecified type Assessment & Plan: Check blood today  Orders: -     Vitamin B12  Abnormal thyroid blood test -      TSH  Other fatigue -     CBC with Differential/Platelet -     Comprehensive metabolic panel -     Lipid panel -     TSH -     Vitamin B12  Primary hypertension Assessment & Plan: Well controlled, no changes to meds. Encouraged heart healthy diet such as the DASH diet and exercise as tolerated.  Check labs.  Adjust meds prn  Orders: -     CBC with Differential/Platelet -     Comprehensive metabolic panel -     Lipid panel -     TSH -     Vitamin B12 -     Metoprolol Succinate ER; Take 1 tablet (25 mg total) by mouth daily.  Dispense: 30 tablet; Refill: 3  Dysmenorrhea Assessment & Plan: With violent vomiting --- witnessed in office Phenergan 50 mg Im was given in office  Pt was able to drink some juice in office   Other iron deficiency anemia Assessment & Plan: Check labs today      I,Rachel Rivera,acting as a scribe for Ann Held, DO.,have documented all relevant documentation on the behalf of Ann Held, DO,as directed by  Ann Held, DO while in the presence of Ann Held, DO.   I, Ann Held, DO, personally preformed the services described in this documentation.  All medical record entries made by the scribe were at my direction and in my presence.  I have reviewed the chart and discharge instructions (if applicable) and agree that the record reflects my  personal performance and is accurate and complete. 09/09/22   Ann Held, DO

## 2022-09-10 LAB — CBC WITH DIFFERENTIAL/PLATELET
Absolute Monocytes: 371 cells/uL (ref 200–950)
Basophils Absolute: 19 cells/uL (ref 0–200)
Basophils Relative: 0.3 %
Eosinophils Absolute: 70 cells/uL (ref 15–500)
Eosinophils Relative: 1.1 %
HCT: 29.6 % — ABNORMAL LOW (ref 35.0–45.0)
Hemoglobin: 8.5 g/dL — ABNORMAL LOW (ref 11.7–15.5)
Lymphs Abs: 2125 cells/uL (ref 850–3900)
MCH: 18.4 pg — ABNORMAL LOW (ref 27.0–33.0)
MCHC: 28.7 g/dL — ABNORMAL LOW (ref 32.0–36.0)
MCV: 63.9 fL — ABNORMAL LOW (ref 80.0–100.0)
MPV: 9.4 fL (ref 7.5–12.5)
Monocytes Relative: 5.8 %
Neutro Abs: 3814 cells/uL (ref 1500–7800)
Neutrophils Relative %: 59.6 %
Platelets: 575 10*3/uL — ABNORMAL HIGH (ref 140–400)
RBC: 4.63 10*6/uL (ref 3.80–5.10)
RDW: 18.5 % — ABNORMAL HIGH (ref 11.0–15.0)
Total Lymphocyte: 33.2 %
WBC: 6.4 10*3/uL (ref 3.8–10.8)

## 2022-09-10 LAB — COMPREHENSIVE METABOLIC PANEL
AG Ratio: 1.1 (calc) (ref 1.0–2.5)
ALT: 8 U/L (ref 6–29)
AST: 11 U/L (ref 10–30)
Albumin: 4.1 g/dL (ref 3.6–5.1)
Alkaline phosphatase (APISO): 74 U/L (ref 31–125)
BUN: 9 mg/dL (ref 7–25)
CO2: 24 mmol/L (ref 20–32)
Calcium: 9.6 mg/dL (ref 8.6–10.2)
Chloride: 101 mmol/L (ref 98–110)
Creat: 0.89 mg/dL (ref 0.50–0.97)
Globulin: 3.7 g/dL (calc) (ref 1.9–3.7)
Glucose, Bld: 102 mg/dL — ABNORMAL HIGH (ref 65–99)
Potassium: 3.6 mmol/L (ref 3.5–5.3)
Sodium: 138 mmol/L (ref 135–146)
Total Bilirubin: 0.3 mg/dL (ref 0.2–1.2)
Total Protein: 7.8 g/dL (ref 6.1–8.1)

## 2022-09-10 LAB — LIPID PANEL
Cholesterol: 214 mg/dL — ABNORMAL HIGH (ref <200)
HDL: 64 mg/dL (ref >=50)
LDL Cholesterol (Calc): 127 mg/dL (calc) — ABNORMAL HIGH
Non-HDL Cholesterol (Calc): 150 mg/dL (calc) — ABNORMAL HIGH (ref <130)
Total CHOL/HDL Ratio: 3.3 (calc) (ref <5.0)
Triglycerides: 118 mg/dL (ref <150)

## 2022-09-10 LAB — VITAMIN B12: Vitamin B-12: >2000 pg/mL — ABNORMAL HIGH (ref 200–1100)

## 2022-09-10 LAB — CBC MORPHOLOGY

## 2022-09-10 LAB — TSH: TSH: 0.97 mIU/L

## 2022-09-16 ENCOUNTER — Other Ambulatory Visit: Payer: Self-pay | Admitting: Family Medicine

## 2022-09-16 ENCOUNTER — Encounter: Payer: Self-pay | Admitting: Family Medicine

## 2022-09-16 DIAGNOSIS — D649 Anemia, unspecified: Secondary | ICD-10-CM

## 2022-09-21 ENCOUNTER — Other Ambulatory Visit: Payer: Self-pay | Admitting: Family Medicine

## 2022-09-21 DIAGNOSIS — I10 Essential (primary) hypertension: Secondary | ICD-10-CM

## 2022-09-27 ENCOUNTER — Inpatient Hospital Stay: Payer: BC Managed Care – PPO | Attending: Hematology & Oncology

## 2022-09-27 ENCOUNTER — Inpatient Hospital Stay (HOSPITAL_BASED_OUTPATIENT_CLINIC_OR_DEPARTMENT_OTHER): Payer: BC Managed Care – PPO | Admitting: Family

## 2022-09-27 ENCOUNTER — Encounter: Payer: Self-pay | Admitting: Family

## 2022-09-27 ENCOUNTER — Other Ambulatory Visit: Payer: Self-pay | Admitting: Family

## 2022-09-27 ENCOUNTER — Other Ambulatory Visit: Payer: Self-pay

## 2022-09-27 ENCOUNTER — Telehealth: Payer: Self-pay | Admitting: Internal Medicine

## 2022-09-27 VITALS — BP 123/79 | HR 100 | Temp 98.4°F | Resp 18 | Ht 64.0 in | Wt 257.0 lb

## 2022-09-27 DIAGNOSIS — D500 Iron deficiency anemia secondary to blood loss (chronic): Secondary | ICD-10-CM

## 2022-09-27 DIAGNOSIS — Z803 Family history of malignant neoplasm of breast: Secondary | ICD-10-CM | POA: Insufficient documentation

## 2022-09-27 DIAGNOSIS — F17210 Nicotine dependence, cigarettes, uncomplicated: Secondary | ICD-10-CM | POA: Diagnosis not present

## 2022-09-27 DIAGNOSIS — F1290 Cannabis use, unspecified, uncomplicated: Secondary | ICD-10-CM | POA: Diagnosis not present

## 2022-09-27 DIAGNOSIS — K219 Gastro-esophageal reflux disease without esophagitis: Secondary | ICD-10-CM | POA: Diagnosis not present

## 2022-09-27 DIAGNOSIS — D509 Iron deficiency anemia, unspecified: Secondary | ICD-10-CM | POA: Insufficient documentation

## 2022-09-27 DIAGNOSIS — D649 Anemia, unspecified: Secondary | ICD-10-CM

## 2022-09-27 DIAGNOSIS — Z79899 Other long term (current) drug therapy: Secondary | ICD-10-CM | POA: Diagnosis not present

## 2022-09-27 LAB — CBC WITH DIFFERENTIAL (CANCER CENTER ONLY)
Abs Immature Granulocytes: 0.07 10*3/uL (ref 0.00–0.07)
Basophils Absolute: 0 10*3/uL (ref 0.0–0.1)
Basophils Relative: 0 %
Eosinophils Absolute: 0.1 10*3/uL (ref 0.0–0.5)
Eosinophils Relative: 2 %
HCT: 29.7 % — ABNORMAL LOW (ref 36.0–46.0)
Hemoglobin: 8.1 g/dL — ABNORMAL LOW (ref 12.0–15.0)
Immature Granulocytes: 1 %
Lymphocytes Relative: 24 %
Lymphs Abs: 1.7 10*3/uL (ref 0.7–4.0)
MCH: 17.9 pg — ABNORMAL LOW (ref 26.0–34.0)
MCHC: 27.3 g/dL — ABNORMAL LOW (ref 30.0–36.0)
MCV: 65.7 fL — ABNORMAL LOW (ref 80.0–100.0)
Monocytes Absolute: 0.5 10*3/uL (ref 0.1–1.0)
Monocytes Relative: 7 %
Neutro Abs: 4.8 10*3/uL (ref 1.7–7.7)
Neutrophils Relative %: 66 %
Platelet Count: 505 10*3/uL — ABNORMAL HIGH (ref 150–400)
RBC: 4.52 MIL/uL (ref 3.87–5.11)
RDW: 18.6 % — ABNORMAL HIGH (ref 11.5–15.5)
WBC Count: 7.2 10*3/uL (ref 4.0–10.5)
nRBC: 0 % (ref 0.0–0.2)

## 2022-09-27 LAB — CMP (CANCER CENTER ONLY)
ALT: 11 U/L (ref 0–44)
AST: 21 U/L (ref 15–41)
Albumin: 3.6 g/dL (ref 3.5–5.0)
Alkaline Phosphatase: 73 U/L (ref 38–126)
Anion gap: 12 (ref 5–15)
BUN: 11 mg/dL (ref 6–20)
CO2: 22 mmol/L (ref 22–32)
Calcium: 8.8 mg/dL — ABNORMAL LOW (ref 8.9–10.3)
Chloride: 100 mmol/L (ref 98–111)
Creatinine: 0.94 mg/dL (ref 0.44–1.00)
GFR, Estimated: >60 mL/min (ref >60)
Glucose, Bld: 114 mg/dL — ABNORMAL HIGH (ref 70–99)
Potassium: 3.3 mmol/L — ABNORMAL LOW (ref 3.5–5.1)
Sodium: 134 mmol/L — ABNORMAL LOW (ref 135–145)
Total Bilirubin: 0.3 mg/dL (ref 0.3–1.2)
Total Protein: 8.1 g/dL (ref 6.5–8.1)

## 2022-09-27 LAB — RETICULOCYTES
Immature Retic Fract: 34.3 % — ABNORMAL HIGH (ref 2.3–15.9)
RBC.: 4.56 MIL/uL (ref 3.87–5.11)
Retic Count, Absolute: 79.8 10*3/uL (ref 19.0–186.0)
Retic Ct Pct: 1.8 % (ref 0.4–3.1)

## 2022-09-27 LAB — LACTATE DEHYDROGENASE: LDH: 142 U/L (ref 98–192)

## 2022-09-27 LAB — FERRITIN: Ferritin: 5 ng/mL — ABNORMAL LOW (ref 11–307)

## 2022-09-27 NOTE — Telephone Encounter (Signed)
Informed pt she didn't need forms also told her  and [place] have lab techs in office if that is easier she said it was and would be coming by to have lab drawn in clinic

## 2022-09-27 NOTE — Telephone Encounter (Signed)
Patient called and stated she misplaced the paperwork for [facility] and wanted to know if she can get another copy so she can get her blood work done at [facility] back number [phone].

## 2022-09-27 NOTE — Progress Notes (Signed)
Hematology/Oncology Consultation   Name: Sherry Villegas      MRN: [id number]    Location: Room/bed info not found  Date: 09/27/2022 Time:3:07 PM   REFERRING PHYSICIAN: Kendrick Fries L. Chase, DO  REASON FOR CONSULT: Anemia   DIAGNOSIS: Iron deficiency anemia   HISTORY OF PRESENT ILLNESS:  Sherry Villegas is a very pleasant 37 yo African American female with iron deficiency anemia.  Iron saturation back in 06/2022 was down to 3% and ferritin 5.  Hgb today is 8.1, MCV 65.7, platelets 505 and WBC count 7.2.  She is symptomatic with fatigue, weakness, dizziness, SOB and chest discomfort with exertion (stairs), thirst, craving ice and corn starch, brain fog, tingling in her hands and feet and swelling in her ankles.  Her cycle is regular with heavy flow. She has severe cramping as well as n/v with cycle.  No other blood loss noted. No bruising or petechiae.  No history uterine fibroids. She has history cysts which she states effect her right side more than the left. These cysts her a great deal of pain and n/v as well.  Her cousin has history of anemia.  No known history of sickle cell.  No personal history of cancer. Both grandmothers and one maternal aunt had history of breast cancer.  No diabetes or thyroid disease.  Gynecology has been reserved in her treatment because she and her partner are hoping to have a sweet little baby. She has been referred to [facility] for further testing.  No fever, cough, rash, palpitations or changes in bowel or bladder habits.  She recently switched from Protonix to Prilosec for GERD.  No falls or syncope reported.  She has occasional sciatica in her legs. She states that she does have chronic lower back pain.  Appetite and hydration are good. Weight is 257 lbs.  No ETOH or recreational drug use at this time.  She is down to smoking only 2 cigarettes a day! 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   ROS: All other 10 point review of systems is  negative.   PAST MEDICAL HISTORY:   Past Medical History:  Diagnosis Date   Anemia    Anxiety    Asthma    Back pain    Constipation    GERD (gastroesophageal reflux disease)    Hyperlipidemia    Hypertension    Migraine    Stomach ulcer    Vaginal Pap smear, abnormal     ALLERGIES: Allergies  Allergen Reactions   Amoxil [Amoxicillin] Swelling    Tongue was swollen   Dulera [Mometasone Furo-Formoterol Fum] Hives   Influenza Vaccines Itching      MEDICATIONS:  Current Outpatient Medications on File Prior to Visit  Medication Sig Dispense Refill   acetaminophen (TYLENOL) 325 MG tablet Take 650 mg by mouth every 6 (six) hours as needed.     albuterol (VENTOLIN HFA) 108 (90 Base) MCG/ACT inhaler Inhale 2 puffs into the lungs every 6 (six) hours as needed for wheezing or shortness of breath. 1 g 2   amLODipine (NORVASC) 5 MG tablet Take 1 tablet (5 mg total) by mouth daily. 90 tablet 1   Aspirin-Acetaminophen-Caffeine (EXCEDRIN PO) Take by mouth. Prn     diclofenac (VOLTAREN) 75 MG EC tablet Take 1 tablet (75 mg total) by mouth 2 (two) times daily with a meal. 30 tablet 2   famotidine (PEPCID) 20 MG tablet Take 1 tablet (20 mg total) by mouth daily. 30 tablet 0  fexofenadine (ALLEGRA) 60 MG tablet Take 60 mg by mouth 2 (two) times daily.     hydrochlorothiazide (HYDRODIURIL) 25 MG tablet Take 1 tablet (25 mg total) by mouth daily. 90 tablet 1   metoprolol succinate (TOPROL-XL) 25 MG 24 hr tablet Take 1 tablet (25 mg total) by mouth daily. 30 tablet 3   prochlorperazine (COMPAZINE) 10 MG tablet Take 1 tablet (10 mg total) by mouth every 6 (six) hours as needed for nausea or vomiting. 30 tablet 6   valACYclovir (VALTREX) 1000 MG tablet Take 1 tablet (1,000 mg total) by mouth 2 (two) times daily. 60 tablet 2   Vitamin D, Ergocalciferol, (DRISDOL) 1.25 MG (50000 UNIT) CAPS capsule Take 1 capsule (50,000 Units total) by mouth every 7 (seven) days. 12 capsule 1   EPINEPHrine 0.3  mg/0.3 mL IJ SOAJ injection Inject 0.3 mg into the muscle as needed for anaphylaxis. (Patient not taking: Reported on 09/27/2022) 1 each 1   No current facility-administered medications on file prior to visit.     PAST SURGICAL HISTORY No past surgical history on file.  FAMILY HISTORY: Family History  Problem Relation Age of Onset   Heart failure Mother    Hypertension Mother    Heart disease Mother 27       MI   Stroke Mother    Sudden death Mother    Cancer Father    Heart failure Father    Stroke Father    Hypertension Father    Diabetes Father    Heart attack Father    Dementia Father    Breast cancer Maternal Grandmother    Breast cancer Paternal Grandmother    Breast cancer Maternal Aunt     SOCIAL HISTORY:  reports that she has been smoking cigarettes. She has a 3.75 pack-year smoking history. She has never used smokeless tobacco. She reports current alcohol use of about 6.0 standard drinks of alcohol per week. She reports current drug use. Drug: Marijuana.  PERFORMANCE STATUS: The patient's performance status is 1 - Symptomatic but completely ambulatory  PHYSICAL EXAM: Most Recent Vital Signs: Last menstrual period 09/08/2022. BP 123/79 (BP Location: Left Arm, Patient Position: Sitting)   Pulse 100   Temp 98.4 F (36.9 C) (Oral)   Resp 18   Ht 5\' 4"  (1.626 m)   Wt 257 lb (116.6 kg)   LMP 09/08/2022   SpO2 100%   BMI 44.11 kg/m   General Appearance:    Alert, cooperative, no distress, appears stated age  Head:    Normocephalic, without obvious abnormality, atraumatic  Eyes:    PERRL, conjunctiva/corneas clear, EOM's intact, fundi    benign, both eyes  Ears:    Normal TM's and external ear canals, both ears  Nose:   Nares normal, septum midline, mucosa normal, no drainage    or sinus tenderness  Throat:   Lips, mucosa, and tongue normal; teeth and gums normal  Neck:   Supple, symmetrical, trachea midline, no adenopathy;    thyroid:  no  enlargement/tenderness/nodules; no carotid   bruit or JVD  Back:     Symmetric, no curvature, ROM normal, no CVA tenderness  Lungs:     Clear to auscultation bilaterally, respirations unlabored  Chest Wall:    No tenderness or deformity   Heart:    Regular rate and rhythm, S1 and S2 normal, no murmur, rub   or gallop  Breast Exam:    No tenderness, masses, or nipple abnormality  Abdomen:  Soft, non-tender, bowel sounds active all four quadrants,    no masses, no organomegaly  Genitalia:    Normal female without lesion, discharge or tenderness  Rectal:    Normal tone, normal prostate, no masses or tenderness;   guaiac negative stool  Extremities:   Extremities normal, atraumatic, no cyanosis or edema  Pulses:   2+ and symmetric all extremities  Skin:   Skin color, texture, turgor normal, no rashes or lesions  Lymph nodes:   Cervical, supraclavicular, and axillary nodes normal  Neurologic:   CNII-XII intact, normal strength, sensation and reflexes    throughout    LABORATORY DATA:  Results for orders placed or performed in visit on 09/27/22 (from the past 48 hour(s))  CBC with Differential (Cancer Center Only)     Status: Abnormal   Collection Time: 09/27/22  2:41 PM  Result Value Ref Range   WBC Count 7.2 4.0 - 10.5 K/uL   RBC 4.52 3.87 - 5.11 MIL/uL   Hemoglobin 8.1 (L) 12.0 - 15.0 g/dL    Comment: Reticulocyte Hemoglobin testing may be clinically indicated, consider ordering this additional test [id number]    HCT 29.7 (L) 36.0 - 46.0 %   MCV 65.7 (L) 80.0 - 100.0 fL   MCH 17.9 (L) 26.0 - 34.0 pg   MCHC 27.3 (L) 30.0 - 36.0 g/dL   RDW 18.6 (H) 11.5 - 15.5 %   Platelet Count 505 (H) 150 - 400 K/uL   nRBC 0.0 0.0 - 0.2 %   Neutrophils Relative % 66 %   Neutro Abs 4.8 1.7 - 7.7 K/uL   Lymphocytes Relative 24 %   Lymphs Abs 1.7 0.7 - 4.0 K/uL   Monocytes Relative 7 %   Monocytes Absolute 0.5 0.1 - 1.0 K/uL   Eosinophils Relative 2 %   Eosinophils Absolute 0.1 0.0 - 0.5  K/uL   Basophils Relative 0 %   Basophils Absolute 0.0 0.0 - 0.1 K/uL   Immature Granulocytes 1 %   Abs Immature Granulocytes 0.07 0.00 - 0.07 K/uL    Comment: Performed at [facility] at [facility], [address] [place], [place] [address]  Reticulocytes     Status: Abnormal   Collection Time: 09/27/22  2:42 PM  Result Value Ref Range   Retic Ct Pct 1.8 0.4 - 3.1 %   RBC. 4.56 3.87 - 5.11 MIL/uL   Retic Count, Absolute 79.8 19.0 - 186.0 K/uL   Immature Retic Fract 34.3 (H) 2.3 - 15.9 %    Comment: Performed at [facility] at [facility], [address], [place], [place] [address]      RADIOGRAPHY: No results found.     PATHOLOGY: None   ASSESSMENT/PLAN: Sherry Villegas is a very pleasant 37 yo [place] American female with iron deficiency anemia.  We will get her set up for 3 doses of IV iron starting this week.  Follow-up in 8 weeks.  Anemia work up pending. She may also benefit from daily folic acid.   All questions were answered. The patient knows to call the clinic with any problems, questions or concerns. We can certainly see the patient much sooner if necessary.  The patient was discussed with Dr. Marin Olp and he is in agreement with the aforementioned.   Lottie Dawson, NP

## 2022-09-28 LAB — IRON AND IRON BINDING CAPACITY (CC-WL,HP ONLY)
Iron: 15 ug/dL — ABNORMAL LOW (ref 28–170)
Saturation Ratios: 3 % — ABNORMAL LOW (ref 10.4–31.8)
TIBC: 525 ug/dL — ABNORMAL HIGH (ref 250–450)
UIBC: 510 ug/dL — ABNORMAL HIGH (ref 148–442)

## 2022-09-28 LAB — ERYTHROPOIETIN: Erythropoietin: 207.3 m[IU]/mL — ABNORMAL HIGH (ref 2.6–18.5)

## 2022-09-29 LAB — HGB FRACTIONATION CASCADE
Hgb A2: 2 % (ref 1.8–3.2)
Hgb A: 98 % (ref 96.4–98.8)
Hgb F: 0 % (ref 0.0–2.0)
Hgb S: 0 %

## 2022-09-30 ENCOUNTER — Inpatient Hospital Stay: Payer: BC Managed Care – PPO

## 2022-09-30 VITALS — BP 116/64 | HR 90 | Temp 97.8°F | Resp 16

## 2022-09-30 DIAGNOSIS — N920 Excessive and frequent menstruation with regular cycle: Secondary | ICD-10-CM

## 2022-09-30 DIAGNOSIS — F1290 Cannabis use, unspecified, uncomplicated: Secondary | ICD-10-CM | POA: Diagnosis not present

## 2022-09-30 DIAGNOSIS — K219 Gastro-esophageal reflux disease without esophagitis: Secondary | ICD-10-CM | POA: Diagnosis not present

## 2022-09-30 DIAGNOSIS — Z79899 Other long term (current) drug therapy: Secondary | ICD-10-CM | POA: Diagnosis not present

## 2022-09-30 DIAGNOSIS — Z803 Family history of malignant neoplasm of breast: Secondary | ICD-10-CM | POA: Diagnosis not present

## 2022-09-30 DIAGNOSIS — D500 Iron deficiency anemia secondary to blood loss (chronic): Secondary | ICD-10-CM

## 2022-09-30 DIAGNOSIS — F17210 Nicotine dependence, cigarettes, uncomplicated: Secondary | ICD-10-CM | POA: Diagnosis not present

## 2022-09-30 DIAGNOSIS — D509 Iron deficiency anemia, unspecified: Secondary | ICD-10-CM | POA: Diagnosis not present

## 2022-09-30 MED ORDER — SODIUM CHLORIDE 0.9 % IV SOLN
300.0000 mg | Freq: Once | INTRAVENOUS | Status: AC
  Administered 2022-09-30: 300 mg via INTRAVENOUS
  Filled 2022-09-30: qty 200

## 2022-09-30 MED ORDER — SODIUM CHLORIDE 0.9 % IV SOLN: Freq: Once | INTRAVENOUS | Status: AC

## 2022-10-07 ENCOUNTER — Inpatient Hospital Stay: Payer: BC Managed Care – PPO

## 2022-10-07 VITALS — BP 117/75 | HR 88 | Resp 17

## 2022-10-07 DIAGNOSIS — K219 Gastro-esophageal reflux disease without esophagitis: Secondary | ICD-10-CM | POA: Diagnosis not present

## 2022-10-07 DIAGNOSIS — D500 Iron deficiency anemia secondary to blood loss (chronic): Secondary | ICD-10-CM

## 2022-10-07 DIAGNOSIS — D509 Iron deficiency anemia, unspecified: Secondary | ICD-10-CM | POA: Diagnosis not present

## 2022-10-07 DIAGNOSIS — Z79899 Other long term (current) drug therapy: Secondary | ICD-10-CM | POA: Diagnosis not present

## 2022-10-07 DIAGNOSIS — N920 Excessive and frequent menstruation with regular cycle: Secondary | ICD-10-CM

## 2022-10-07 DIAGNOSIS — Z803 Family history of malignant neoplasm of breast: Secondary | ICD-10-CM | POA: Diagnosis not present

## 2022-10-07 DIAGNOSIS — F1290 Cannabis use, unspecified, uncomplicated: Secondary | ICD-10-CM | POA: Diagnosis not present

## 2022-10-07 DIAGNOSIS — F17210 Nicotine dependence, cigarettes, uncomplicated: Secondary | ICD-10-CM | POA: Diagnosis not present

## 2022-10-07 MED ORDER — SODIUM CHLORIDE 0.9 % IV SOLN: Freq: Once | INTRAVENOUS | Status: AC

## 2022-10-07 MED ORDER — SODIUM CHLORIDE 0.9 % IV SOLN
300.0000 mg | Freq: Once | INTRAVENOUS | Status: AC
  Administered 2022-10-07: 300 mg via INTRAVENOUS
  Filled 2022-10-07: qty 300

## 2022-10-07 NOTE — Patient Instructions (Signed)

## 2022-10-12 DIAGNOSIS — J4540 Moderate persistent asthma, uncomplicated: Secondary | ICD-10-CM | POA: Diagnosis not present

## 2022-10-14 ENCOUNTER — Inpatient Hospital Stay: Payer: BC Managed Care – PPO

## 2022-10-18 LAB — CBC WITH DIFF/PLATELET
Basophils Absolute: 0 10*3/uL (ref 0.0–0.2)
Basos: 0 %
EOS (ABSOLUTE): 0.1 10*3/uL (ref 0.0–0.4)
Eos: 1 %
Hematocrit: 32.6 % — ABNORMAL LOW (ref 34.0–46.6)
Hemoglobin: 9.6 g/dL — ABNORMAL LOW (ref 11.1–15.9)
Immature Grans (Abs): 0 10*3/uL (ref 0.0–0.1)
Immature Granulocytes: 1 %
Lymphocytes Absolute: 1.7 10*3/uL (ref 0.7–3.1)
Lymphs: 24 %
MCH: 20.2 pg — ABNORMAL LOW (ref 26.6–33.0)
MCHC: 29.4 g/dL — ABNORMAL LOW (ref 31.5–35.7)
MCV: 69 fL — ABNORMAL LOW (ref 79–97)
Monocytes Absolute: 0.4 10*3/uL (ref 0.1–0.9)
Monocytes: 5 %
Neutrophils Absolute: 4.9 10*3/uL (ref 1.4–7.0)
Neutrophils: 69 %
Platelets: 483 10*3/uL — ABNORMAL HIGH (ref 150–450)
RBC: 4.76 x10E6/uL (ref 3.77–5.28)
RDW: 24.7 % — ABNORMAL HIGH (ref 11.7–15.4)
WBC: 7 10*3/uL (ref 3.4–10.8)

## 2022-10-18 LAB — ALLERGENS W/TOTAL IGE AREA 2
Alternaria Alternata IgE: <0.1 kU/L
Aspergillus Fumigatus IgE: <0.1 kU/L
Bermuda Grass IgE: <0.1 kU/L
Cat Dander IgE: <0.1 kU/L
Cedar, Mountain IgE: <0.1 kU/L
Cladosporium Herbarum IgE: <0.1 kU/L
Cockroach, German IgE: 1.34 kU/L — AB
Common Silver Birch IgE: <0.1 kU/L
Cottonwood IgE: <0.1 kU/L
D Farinae IgE: 0.31 kU/L — AB
D Pteronyssinus IgE: 0.22 kU/L — AB
Dog Dander IgE: <0.1 kU/L
Elm, American IgE: <0.1 kU/L
IgE (Immunoglobulin E), Serum: 97 IU/mL (ref 6–495)
Johnson Grass IgE: <0.1 kU/L
Maple/Box Elder IgE: <0.1 kU/L
Mouse Urine IgE: <0.1 kU/L
Oak, White IgE: <0.1 kU/L
Pecan, Hickory IgE: <0.1 kU/L
Penicillium Chrysogen IgE: <0.1 kU/L
Pigweed, Rough IgE: <0.1 kU/L
Ragweed, Short IgE: <0.1 kU/L
Sheep Sorrel IgE Qn: <0.1 kU/L
Timothy Grass IgE: <0.1 kU/L
White Mulberry IgE: <0.1 kU/L

## 2022-10-18 NOTE — Progress Notes (Signed)
Based on labs patient would be a candidate for Xolair for her asthma.  Can someone gauge interest of patient for biologic therapy (special asthma injectable medications) have not seen her in a long time and a follow-up appointment to discuss this might be helpful.

## 2022-10-21 ENCOUNTER — Telehealth: Payer: Self-pay

## 2022-10-21 ENCOUNTER — Other Ambulatory Visit: Payer: Self-pay | Admitting: Family

## 2022-10-21 ENCOUNTER — Inpatient Hospital Stay: Payer: BC Managed Care – PPO | Attending: Hematology & Oncology

## 2022-10-21 VITALS — BP 111/84 | HR 103 | Temp 97.9°F | Resp 17

## 2022-10-21 DIAGNOSIS — D500 Iron deficiency anemia secondary to blood loss (chronic): Secondary | ICD-10-CM

## 2022-10-21 DIAGNOSIS — D509 Iron deficiency anemia, unspecified: Secondary | ICD-10-CM | POA: Insufficient documentation

## 2022-10-21 DIAGNOSIS — N920 Excessive and frequent menstruation with regular cycle: Secondary | ICD-10-CM

## 2022-10-21 DIAGNOSIS — D563 Thalassemia minor: Secondary | ICD-10-CM

## 2022-10-21 LAB — ALPHA-THALASSEMIA GENOTYPR

## 2022-10-21 MED ORDER — FOLIC ACID 1 MG PO TABS: 1.0000 mg | ORAL_TABLET | Freq: Every day | ORAL | 11 refills | Status: DC

## 2022-10-21 MED ORDER — SODIUM CHLORIDE 0.9 % IV SOLN: Freq: Once | INTRAVENOUS | Status: AC

## 2022-10-21 MED ORDER — SODIUM CHLORIDE 0.9 % IV SOLN
300.0000 mg | Freq: Once | INTRAVENOUS | Status: AC
  Administered 2022-10-21: 300 mg via INTRAVENOUS
  Filled 2022-10-21: qty 300

## 2022-10-21 NOTE — Telephone Encounter (Addendum)
Attached message given to pt during infusion appt today. Verbalizes understanding and appreciation. dph  ----- Message from Erenest Blank, NP sent at 10/21/2022 12:55 PM EDT ----- She is positive for the alpha thalassemia minor trait which is quite common in the African American population. This means the is prone to a mild anemia and making smaller red blood cells. The treatment is daily folic acid which I have sent to her pharmacy. Thank you!   ----- Message ----- From: Interface, Lab In [place] Sent: 09/27/2022   2:54 PM EDT To: Erenest Blank, NP

## 2022-10-21 NOTE — Patient Instructions (Signed)

## 2022-11-22 ENCOUNTER — Encounter: Payer: Self-pay | Admitting: Internal Medicine

## 2022-11-23 ENCOUNTER — Ambulatory Visit (INDEPENDENT_AMBULATORY_CARE_PROVIDER_SITE_OTHER): Payer: BC Managed Care – PPO | Admitting: Internal Medicine

## 2022-11-23 ENCOUNTER — Encounter: Payer: Self-pay | Admitting: Internal Medicine

## 2022-11-23 ENCOUNTER — Encounter: Payer: Self-pay | Admitting: Family

## 2022-11-23 ENCOUNTER — Inpatient Hospital Stay: Payer: BC Managed Care – PPO | Attending: Hematology & Oncology

## 2022-11-23 ENCOUNTER — Inpatient Hospital Stay (HOSPITAL_BASED_OUTPATIENT_CLINIC_OR_DEPARTMENT_OTHER): Payer: BC Managed Care – PPO | Admitting: Family

## 2022-11-23 VITALS — BP 113/65 | HR 80 | Temp 98.5°F | Resp 17 | Wt 255.1 lb

## 2022-11-23 VITALS — BP 120/78 | HR 83 | Resp 18

## 2022-11-23 DIAGNOSIS — N920 Excessive and frequent menstruation with regular cycle: Secondary | ICD-10-CM

## 2022-11-23 DIAGNOSIS — D509 Iron deficiency anemia, unspecified: Secondary | ICD-10-CM | POA: Diagnosis not present

## 2022-11-23 DIAGNOSIS — J4540 Moderate persistent asthma, uncomplicated: Secondary | ICD-10-CM | POA: Diagnosis not present

## 2022-11-23 DIAGNOSIS — L501 Idiopathic urticaria: Secondary | ICD-10-CM | POA: Diagnosis not present

## 2022-11-23 DIAGNOSIS — J3089 Other allergic rhinitis: Secondary | ICD-10-CM | POA: Diagnosis not present

## 2022-11-23 DIAGNOSIS — J0101 Acute recurrent maxillary sinusitis: Secondary | ICD-10-CM

## 2022-11-23 DIAGNOSIS — T782XXD Anaphylactic shock, unspecified, subsequent encounter: Secondary | ICD-10-CM

## 2022-11-23 DIAGNOSIS — D563 Thalassemia minor: Secondary | ICD-10-CM | POA: Diagnosis not present

## 2022-11-23 DIAGNOSIS — D500 Iron deficiency anemia secondary to blood loss (chronic): Secondary | ICD-10-CM

## 2022-11-23 DIAGNOSIS — K219 Gastro-esophageal reflux disease without esophagitis: Secondary | ICD-10-CM

## 2022-11-23 DIAGNOSIS — B370 Candidal stomatitis: Secondary | ICD-10-CM

## 2022-11-23 LAB — CBC WITH DIFFERENTIAL (CANCER CENTER ONLY)
Abs Immature Granulocytes: 0.05 10*3/uL (ref 0.00–0.07)
Basophils Absolute: 0 10*3/uL (ref 0.0–0.1)
Basophils Relative: 0 %
Eosinophils Absolute: 0.2 10*3/uL (ref 0.0–0.5)
Eosinophils Relative: 3 %
HCT: 36.7 % (ref 36.0–46.0)
Hemoglobin: 10.8 g/dL — ABNORMAL LOW (ref 12.0–15.0)
Immature Granulocytes: 1 %
Lymphocytes Relative: 29 %
Lymphs Abs: 1.9 10*3/uL (ref 0.7–4.0)
MCH: 23.4 pg — ABNORMAL LOW (ref 26.0–34.0)
MCHC: 29.4 g/dL — ABNORMAL LOW (ref 30.0–36.0)
MCV: 79.4 fL — ABNORMAL LOW (ref 80.0–100.0)
Monocytes Absolute: 0.7 10*3/uL (ref 0.1–1.0)
Monocytes Relative: 10 %
Neutro Abs: 3.7 10*3/uL (ref 1.7–7.7)
Neutrophils Relative %: 57 %
Platelet Count: 401 10*3/uL — ABNORMAL HIGH (ref 150–400)
RBC: 4.62 MIL/uL (ref 3.87–5.11)
RDW: 26.8 % — ABNORMAL HIGH (ref 11.5–15.5)
WBC Count: 6.5 10*3/uL (ref 4.0–10.5)
nRBC: 0 % (ref 0.0–0.2)

## 2022-11-23 LAB — RETICULOCYTES
Immature Retic Fract: 24.5 % — ABNORMAL HIGH (ref 2.3–15.9)
RBC.: 4.7 MIL/uL (ref 3.87–5.11)
Retic Count, Absolute: 68.6 10*3/uL (ref 19.0–186.0)
Retic Ct Pct: 1.5 % (ref 0.4–3.1)

## 2022-11-23 LAB — FERRITIN: Ferritin: 49 ng/mL (ref 11–307)

## 2022-11-23 MED ORDER — BUDESONIDE-FORMOTEROL FUMARATE 80-4.5 MCG/ACT IN AERO: 2.0000 | INHALATION_SPRAY | Freq: Two times a day (BID) | RESPIRATORY_TRACT | 5 refills | Status: AC

## 2022-11-23 MED ORDER — METHYLPREDNISOLONE ACETATE 40 MG/ML IJ SUSP
40.0000 mg | Freq: Once | INTRAMUSCULAR | Status: AC
  Administered 2022-11-23: 40 mg via INTRAMUSCULAR

## 2022-11-23 MED ORDER — DOXYCYCLINE MONOHYDRATE 100 MG PO TABS: 100.0000 mg | ORAL_TABLET | Freq: Two times a day (BID) | ORAL | 0 refills | Status: AC

## 2022-11-23 NOTE — Patient Instructions (Addendum)
Allergic Rhinitis with acute sinus infection  Depo Medrol 40mg  IM given today in clinic  Start doyxcyline 100mg  twice a day for 7 days  Previous testing 09/29/2021: Positive to grass, dust mites, roach Continue Ipatropium nasal spray: 1 spray per nostril as needed for runny nose  Use saline nasal spray as needed.    Urticaria  Continue Allergra 180mg  twice day  We will send a message to our Biologic Coordinator about start xolair 300mg  every 4 weeks  -Tammy will reach out to you   Hive labs: Microcytic anemia,, low TSH, elevated ESR and very positive CU index - for SKIN only reaction, okay to take Benadryl 25  mg  every 6 hours - for SKIN + ANY additional symptoms, OR IF concern for LIFE THREATENING reaction = Epipen Autoinjector EpiPen 0.3 mg. - If using Epinephrine autoinjector, call 911  Asthma: Given in tolerance to [facility] we will switch to symbicort 2 puffs twice daily during URI or increased sympotms  May use albuterol rescue inhaler 2 puffs or nebulizer every 4 to 6 hours as needed for shortness of breath, chest tightness, coughing, and wheezing. May use albuterol rescue inhaler 2 puffs 5 to 15 minutes prior to strenuous physical activities. Monitor frequency of use.  Asthma control goals:  Full participation in all desired activities (may need albuterol before activity) Albuterol use two times or less a week on average (not counting use with activity) Cough interfering with sleep two times or less a month Oral steroids no more than once a year No hospitalizations   Thrush - Focus on reducing ICS   Heartburn: Continue lifestyle and dietary modifications Continue Protonix 40mg  twice a day as prescribed. Continue Pepcid 20mg  as prescribed.  Follow up: in 3 months in clinic after starting xolair   Thank you so much for letting me partake in your care today.  Don't hesitate to reach out if you have any additional concerns!  Ferol Luz, MD  [facility] [place] [place]

## 2022-11-23 NOTE — Progress Notes (Signed)
Follow Up Note  RE: Sherry Villegas MRN: [id number] DOB: [birth year] Date of Office Visit: 11/23/2022  Referring provider: Zola Button, Grayling Congress, * Primary care provider: Zola Button, Grayling Congress, DO  Chief Complaint: Asthma  History of Present Illness: I had the pleasure of seeing Sherry Villegas for a follow up visit at the [facility] on 11/23/2022. She is a 37 y.o. female, who is being followed for asthma, urticaria, anaphylaxis, allergic rhinitis. Her previous allergy office visit was on 04/20/22  with Dr. Marlynn Perking. Today is a regular follow up visit.  History obtained from patient, chart review.  Since last visit biologic labwork returned positive to DM with total IgE 97, AEC 100.    Past 7 days she is at increased sinus pressure, green to yellow rhinorrhea, ear fullness, cough, sore throat.  Has continued her ipratropium without good control of rhinorrhea.  Has not seen urgent care or primary care for symptoms.  She is allergic to Augmentin.  No fevers.  In regards to urticaria she continues to take Allegra twice a day.  If she misses a dose she will have breakthrough urticaria.  Worse in the summer.  She is interested in Xolair.  She denies any other systemic reactions since last visit.  She does have her EpiPen and action plan  She stopped her Breo 1 month ago due to erythematous papules around her mouth and recurrent thrush.  Sherry Villegas has resolved.  Previously treated with nystatin.  Feels like asthma symptoms specifically dyspnea and fatigue have improved with treatment of iron deficiency.  Has not had a cough until recently.  Has used her albuterol over the past week for recurrent cough.  Reflux is well-controlled on Protonix twice a day and Pepcid 20 mg. Assessment and Plan: Sherry Villegas is a 37 y.o. female with: Acute recurrent maxillary sinusitis  Idiopathic urticaria  Moderate persistent asthma without complication  Other allergic rhinitis  Anaphylaxis,  subsequent encounter  Oral candidiasis  Gastroesophageal reflux disease without esophagitis Plan: Patient Instructions  Allergic Rhinitis with acute sinus infection  Depo Medrol 40mg  IM given today in clinic  Start doyxcyline 100mg  twice a day for 7 days  Previous testing 09/29/2021: Positive to grass, dust mites, roach Continue Ipatropium nasal spray: 1 spray per nostril as needed for runny nose  Use saline nasal spray as needed.    Urticaria  Continue Allergra 180mg  twice day  We will send a message to our Biologic Coordinator about start xolair 300mg  every 4 weeks  -Sherry Villegas will reach out to you   Hive labs: Microcytic anemia,, low TSH, elevated ESR and very positive CU index - for SKIN only reaction, okay to take Benadryl 25  mg  every 6 hours - for SKIN + ANY additional symptoms, OR IF concern for LIFE THREATENING reaction = Epipen Autoinjector EpiPen 0.3 mg. - If using Epinephrine autoinjector, call 911  Asthma: Given in tolerance to [facility] we will switch to symbicort 2 puffs twice daily during URI or increased sympotms  May use albuterol rescue inhaler 2 puffs or nebulizer every 4 to 6 hours as needed for shortness of breath, chest tightness, coughing, and wheezing. May use albuterol rescue inhaler 2 puffs 5 to 15 minutes prior to strenuous physical activities. Monitor frequency of use.  Asthma control goals:  Full participation in all desired activities (may need albuterol before activity) Albuterol use two times or less a week on average (not counting use with activity) Cough interfering with sleep two  times or less a month Oral steroids no more than once a year No hospitalizations   Thrush - Focus on reducing ICS   Heartburn: Continue lifestyle and dietary modifications Continue Protonix 40mg  twice a day as prescribed. Continue Pepcid 20mg  as prescribed.  Follow up: in 3 months in clinic after starting xolair   Thank you so much for letting me partake in your  care today.  Don't hesitate to reach out if you have any additional concerns!  Ferol Luz, MD  [facility] [place] [place] No follow-ups on file.  Meds ordered this encounter  Medications   budesonide-formoterol (SYMBICORT) 80-4.5 MCG/ACT inhaler    Sig: Inhale 2 puffs into the lungs in the morning and at bedtime.    Dispense:  1 each    Refill:  5   methylPREDNISolone acetate (DEPO-MEDROL) injection 40 mg   doxycycline (ADOXA) 100 MG tablet    Sig: Take 1 tablet (100 mg total) by mouth 2 (two) times daily for 7 days.    Dispense:  14 tablet    Refill:  0    Lab Orders  No laboratory test(s) ordered today    Diagnostics: None done  Medication List:  Current Outpatient Medications  Medication Sig Dispense Refill   acetaminophen (TYLENOL) 325 MG tablet Take 650 mg by mouth every 6 (six) hours as needed.     amLODipine (NORVASC) 5 MG tablet Take 1 tablet (5 mg total) by mouth daily. 90 tablet 1   Aspirin-Acetaminophen-Caffeine (EXCEDRIN PO) Take by mouth. Prn     budesonide-formoterol (SYMBICORT) 80-4.5 MCG/ACT inhaler Inhale 2 puffs into the lungs in the morning and at bedtime. 1 each 5   diclofenac (VOLTAREN) 75 MG EC tablet Take 1 tablet (75 mg total) by mouth 2 (two) times daily with a meal. 30 tablet 2   dicyclomine (BENTYL) 20 MG tablet Take 20 mg by mouth 3 (three) times daily before meals.     doxycycline (ADOXA) 100 MG tablet Take 1 tablet (100 mg total) by mouth 2 (two) times daily for 7 days. 14 tablet 0   famotidine (PEPCID) 20 MG tablet Take 1 tablet (20 mg total) by mouth daily. 30 tablet 0   ferrous sulfate 324 (65 Fe) MG TBEC Take 1 tablet by mouth daily.     fexofenadine (ALLEGRA) 60 MG tablet Take 60 mg by mouth 2 (two) times daily.     folic acid (FOLVITE) 1 MG tablet Take 1 tablet (1 mg total) by mouth daily. 30 tablet 11   hydrochlorothiazide (HYDRODIURIL) 25 MG tablet Take 1 tablet (25 mg total) by mouth daily. 90 tablet 1   metoprolol  succinate (TOPROL-XL) 25 MG 24 hr tablet Take 1 tablet (25 mg total) by mouth daily. 30 tablet 3   omeprazole (PRILOSEC) 40 MG capsule Take 40 mg by mouth 2 (two) times daily.     prochlorperazine (COMPAZINE) 10 MG tablet Take 1 tablet (10 mg total) by mouth every 6 (six) hours as needed for nausea or vomiting. 30 tablet 6   valACYclovir (VALTREX) 1000 MG tablet Take 1 tablet (1,000 mg total) by mouth 2 (two) times daily. 60 tablet 2   Vitamin D, Ergocalciferol, (DRISDOL) 1.25 MG (50000 UNIT) CAPS capsule Take 1 capsule (50,000 Units total) by mouth every 7 (seven) days. 12 capsule 1   albuterol (VENTOLIN HFA) 108 (90 Base) MCG/ACT inhaler Inhale 2 puffs into the lungs every 6 (six) hours as needed for wheezing or shortness of breath. (Patient  not taking: Reported on 11/23/2022) 1 g 2   EPINEPHrine 0.3 mg/0.3 mL IJ SOAJ injection Inject 0.3 mg into the muscle as needed for anaphylaxis. (Patient not taking: Reported on 09/27/2022) 1 each 1   No current facility-administered medications for this visit.   Allergies: Allergies  Allergen Reactions   Amoxil [Amoxicillin] Swelling    Tongue was swollen   Dulera [Mometasone Furo-Formoterol Fum] Hives   Influenza Vaccines Itching   I reviewed her past medical history, social history, family history, and environmental history and no significant changes have been reported from her previous visit.  ROS: All others negative except as noted per HPI.   Objective: BP 120/78   Pulse 83   Resp 18   SpO2 99%  There is no height or weight on file to calculate BMI. General Appearance:  Alert, cooperative, no distress, appears stated age, hoarse voice hoarse voice  Head:  Normocephalic, without obvious abnormality, atraumatic  Eyes:  Conjunctiva clear, EOM's intact  Nose: Nares normal,  erythematous and edematous nasal mucosa with green rhinorrhea , hypertrophic turbinates, no visible anterior polyps, and septum midline  Throat: Lips, tongue normal; teeth  and gums normal,  white patches on upper palate, no tonsillar exudate, and + cobblestoning  Neck: Supple, symmetrical  Lungs:   clear to auscultation bilaterally, Respirations unlabored, intermittent dry coughing  Heart:  regular rate and rhythm and no murmur, Appears well perfused  Extremities: No edema  Skin: Skin color, texture, turgor normal, no rashes or lesions on visualized portions of skin   Neurologic: No gross deficits   Previous notes and tests were reviewed. The plan was reviewed with the patient/family, and all questions/concerned were addressed.  It was my pleasure to see Sherry Villegas today and participate in her care. Please feel free to contact me with any questions or concerns.  Sincerely,  Ferol Luz, MD  Allergy & Immunology  [facility] Office: [phone]

## 2022-11-23 NOTE — Progress Notes (Signed)
Hematology and Oncology Follow Up Visit  Sherry Villegas [id number] 1985/10/02 36 y.o. 11/23/2022   Principle Diagnosis:  Iron deficiency anemia  Alpha thalassemia minor trait  Current Therapy:  IV iron as indicated Folic acid 1 mg PO daily   Interim History:  Sherry Villegas is here today for follow-up. She is doing quite well and has no complaints at this time.  She tolerated IV iron nicely and Hgb is up to 10.8, MCV 79.  She is taking folic acid daily as prescribed.  No fever, chills, n/v, cough, rash, dizziness, SOB, chest pain, palpitations, abdominal pain or changes in bowel or bladder habits.  Cycle this past month was much more tolerable. No other blood loss noted.  No bruising or petechiae.  She has had a couple boils pop up between her buttocks as well as as one between her breasts. She will try using hydrocortisone cream on these areas.   No swelling, tenderness, numbness or tingling in her extremities. Appetite and hydration are good. Weight is stable at 255 lbs.   ECOG Performance Status: 0 - Asymptomatic  Medications:  Allergies as of 11/23/2022       Reactions   Amoxil [amoxicillin] Swelling   Tongue was swollen   Dulera [mometasone Furo-formoterol Fum] Hives   Influenza Vaccines Itching        Medication List        Accurate as of Nov 23, 2022  2:50 PM. If you have any questions, ask your nurse or doctor.          STOP taking these medications    fluticasone furoate-vilanterol 200-25 MCG/ACT Aepb Commonly known as: BREO ELLIPTA Stopped by: Sherry Luz, MD       TAKE these medications    acetaminophen 325 MG tablet Commonly known as: TYLENOL Take 650 mg by mouth every 6 (six) hours as needed.   albuterol 108 (90 Base) MCG/ACT inhaler Commonly known as: VENTOLIN HFA Inhale 2 puffs into the lungs every 6 (six) hours as needed for wheezing or shortness of breath.   amLODipine 5 MG tablet Commonly known as: NORVASC Take 1 tablet (5 mg total)  by mouth daily.   budesonide-formoterol 80-4.5 MCG/ACT inhaler Commonly known as: SYMBICORT Inhale 2 puffs into the lungs in the morning and at bedtime. Started by: Sherry Luz, MD   diclofenac 75 MG EC tablet Commonly known as: VOLTAREN Take 1 tablet (75 mg total) by mouth 2 (two) times daily with a meal.   dicyclomine 20 MG tablet Commonly known as: BENTYL Take 20 mg by mouth 3 (three) times daily before meals.   doxycycline 100 MG tablet Commonly known as: ADOXA Take 1 tablet (100 mg total) by mouth 2 (two) times daily for 7 days. Started by: Sherry Luz, MD   EPINEPHrine 0.3 mg/0.3 mL Soaj injection Commonly known as: EPI-PEN Inject 0.3 mg into the muscle as needed for anaphylaxis.   EXCEDRIN PO Take by mouth. Prn   famotidine 20 MG tablet Commonly known as: Pepcid Take 1 tablet (20 mg total) by mouth daily.   ferrous sulfate 324 (65 Fe) MG Tbec Take 1 tablet by mouth daily.   fexofenadine 60 MG tablet Commonly known as: ALLEGRA Take 60 mg by mouth 2 (two) times daily.   folic acid 1 MG tablet Commonly known as: FOLVITE Take 1 tablet (1 mg total) by mouth daily.   hydrochlorothiazide 25 MG tablet Commonly known as: HYDRODIURIL Take 1 tablet (25 mg total) by mouth daily.   metoprolol  succinate 25 MG 24 hr tablet Commonly known as: TOPROL-XL Take 1 tablet (25 mg total) by mouth daily.   omeprazole 40 MG capsule Commonly known as: PRILOSEC Take 40 mg by mouth 2 (two) times daily.   prochlorperazine 10 MG tablet Commonly known as: COMPAZINE Take 1 tablet (10 mg total) by mouth every 6 (six) hours as needed for nausea or vomiting.   valACYclovir 1000 MG tablet Commonly known as: VALTREX Take 1 tablet (1,000 mg total) by mouth 2 (two) times daily.   Vitamin D (Ergocalciferol) 1.25 MG (50000 UNIT) Caps capsule Commonly known as: DRISDOL Take 1 capsule (50,000 Units total) by mouth every 7 (seven) days.        Allergies:  Allergies  Allergen  Reactions   Amoxil [Amoxicillin] Swelling    Tongue was swollen   Dulera [Mometasone Furo-Formoterol Fum] Hives   Influenza Vaccines Itching    Past Medical History, Surgical history, Social history, and Family History were reviewed and updated.  Review of Systems: All other 10 point review of systems is negative.   Physical Exam:  vitals were not taken for this visit.   Wt Readings from Last 3 Encounters:  09/27/22 257 lb (116.6 kg)  09/09/22 253 lb 12.8 oz (115.1 kg)  07/20/22 254 lb (115.2 kg)    Ocular: Sclerae unicteric, pupils equal, round and reactive to light Ear-nose-throat: Oropharynx clear, dentition fair Lymphatic: No cervical or supraclavicular adenopathy Lungs no rales or rhonchi, good excursion bilaterally Heart regular rate and rhythm, no murmur appreciated Abd soft, nontender, positive bowel sounds MSK no focal spinal tenderness, no joint edema Neuro: non-focal, well-oriented, appropriate affect Breasts: Deferred   Lab Results  Component Value Date   WBC 6.5 11/23/2022   HGB 10.8 (L) 11/23/2022   HCT 36.7 11/23/2022   MCV 79.4 (L) 11/23/2022   PLT 401 (H) 11/23/2022   Lab Results  Component Value Date   FERRITIN 5 (L) 09/27/2022   IRON 15 (L) 09/27/2022   TIBC 525 (H) 09/27/2022   UIBC 510 (H) 09/27/2022   IRONPCTSAT 3 (L) 09/27/2022   Lab Results  Component Value Date   RETICCTPCT 1.5 11/23/2022   RBC 4.62 11/23/2022   No results found for: "KPAFRELGTCHN", "LAMBDASER", "KAPLAMBRATIO" No results found for: "IGGSERUM", "IGA", "IGMSERUM" No results found for: "TOTALPROTELP", "ALBUMINELP", "A1GS", "A2GS", "BETS", "BETA2SER", "GAMS", "MSPIKE", "SPEI"   Chemistry      Component Value Date/Time   NA 134 (L) 09/27/2022 1441   NA 138 03/10/2022 1640   K 3.3 (L) 09/27/2022 1441   CL 100 09/27/2022 1441   CO2 22 09/27/2022 1441   BUN 11 09/27/2022 1441   BUN 11 03/10/2022 1640   CREATININE 0.94 09/27/2022 1441   CREATININE 0.89 09/09/2022 1514       Component Value Date/Time   CALCIUM 8.8 (L) 09/27/2022 1441   ALKPHOS 73 09/27/2022 1441   AST 21 09/27/2022 1441   ALT 11 09/27/2022 1441   BILITOT 0.3 09/27/2022 1441       Impression and Plan: Sherry Villegas is a very pleasant 37 yo African American female with iron deficiency anemia.  Iron studies are pending.  Follow-up in 3 months.   Eileen Stanford, NP 5/15/20242:50 PM

## 2022-11-24 ENCOUNTER — Telehealth: Payer: Self-pay | Admitting: *Deleted

## 2022-11-24 LAB — IRON AND IRON BINDING CAPACITY (CC-WL,HP ONLY)
Iron: 35 ug/dL (ref 28–170)
Saturation Ratios: 9 % — ABNORMAL LOW (ref 10.4–31.8)
TIBC: 384 ug/dL (ref 250–450)
UIBC: 349 ug/dL (ref 148–442)

## 2022-11-24 MED ORDER — OMALIZUMAB 150 MG/ML ~~LOC~~ SOSY: 300.0000 mg | PREFILLED_SYRINGE | SUBCUTANEOUS | 11 refills | Status: DC

## 2022-11-24 NOTE — Telephone Encounter (Signed)
Called patient and advised approval, copay card and submit to [facility] for Xolair. Will reach out once delivery set to make appt to start therapy

## 2022-11-24 NOTE — Telephone Encounter (Signed)
-----   Message from Ferol Luz, MD sent at 11/23/2022 12:22 PM EDT ----- I like to start this patient on Xolair 300 mg every 4 weeks for idiopathic urticaria which is failed Pepcid and Allegra twice a day.

## 2022-12-09 ENCOUNTER — Inpatient Hospital Stay: Payer: BC Managed Care – PPO

## 2022-12-09 VITALS — BP 117/78 | HR 83

## 2022-12-09 DIAGNOSIS — N920 Excessive and frequent menstruation with regular cycle: Secondary | ICD-10-CM

## 2022-12-09 DIAGNOSIS — D509 Iron deficiency anemia, unspecified: Secondary | ICD-10-CM | POA: Diagnosis not present

## 2022-12-09 DIAGNOSIS — D500 Iron deficiency anemia secondary to blood loss (chronic): Secondary | ICD-10-CM

## 2022-12-09 MED ORDER — SODIUM CHLORIDE 0.9 % IV SOLN
300.0000 mg | Freq: Once | INTRAVENOUS | Status: AC
  Administered 2022-12-09: 300 mg via INTRAVENOUS
  Filled 2022-12-09: qty 300

## 2022-12-09 MED ORDER — SODIUM CHLORIDE 0.9 % IV SOLN: Freq: Once | INTRAVENOUS | Status: AC

## 2022-12-09 NOTE — Patient Instructions (Signed)

## 2022-12-16 ENCOUNTER — Inpatient Hospital Stay: Payer: BC Managed Care – PPO | Attending: Hematology & Oncology

## 2022-12-16 VITALS — BP 101/60 | HR 84 | Temp 98.2°F | Resp 18

## 2022-12-16 DIAGNOSIS — D509 Iron deficiency anemia, unspecified: Secondary | ICD-10-CM | POA: Insufficient documentation

## 2022-12-16 DIAGNOSIS — D500 Iron deficiency anemia secondary to blood loss (chronic): Secondary | ICD-10-CM

## 2022-12-16 DIAGNOSIS — N920 Excessive and frequent menstruation with regular cycle: Secondary | ICD-10-CM

## 2022-12-16 MED ORDER — SODIUM CHLORIDE 0.9 % IV SOLN
300.0000 mg | Freq: Once | INTRAVENOUS | Status: AC
  Administered 2022-12-16: 300 mg via INTRAVENOUS
  Filled 2022-12-16: qty 300

## 2022-12-16 MED ORDER — SODIUM CHLORIDE 0.9 % IV SOLN: Freq: Once | INTRAVENOUS | Status: AC

## 2022-12-16 NOTE — Patient Instructions (Signed)

## 2022-12-16 NOTE — Progress Notes (Signed)
Patient refused to wait 30 minutes post infusion. Released stable and ASX. 

## 2022-12-19 ENCOUNTER — Other Ambulatory Visit: Payer: Self-pay | Admitting: *Deleted

## 2022-12-19 MED ORDER — OMALIZUMAB 150 MG/ML ~~LOC~~ SOSY: 300.0000 mg | PREFILLED_SYRINGE | SUBCUTANEOUS | 11 refills | Status: DC

## 2022-12-20 ENCOUNTER — Telehealth: Payer: Self-pay | Admitting: Family Medicine

## 2022-12-20 NOTE — Telephone Encounter (Signed)
Pt called stating that she has had five boils that have popped up back to back on the following places:  Left Buttock Right Buttock Under Left Breast Under Right Breast Left Armpit  Pt stated that the only new things she has going on is she is taking folic acid and receiving iron infusions. Pt stated she also checked her Glucose level and it was 115.  Pt would like a call to go over symptoms and advice as to what to do next.

## 2022-12-21 NOTE — Telephone Encounter (Signed)
Please schedule Pt an appt w/ provider in office. Thank you.

## 2022-12-22 ENCOUNTER — Encounter: Payer: Self-pay | Admitting: Family Medicine

## 2022-12-22 ENCOUNTER — Ambulatory Visit (INDEPENDENT_AMBULATORY_CARE_PROVIDER_SITE_OTHER): Payer: BC Managed Care – PPO | Admitting: Family Medicine

## 2022-12-22 ENCOUNTER — Telehealth: Payer: Self-pay

## 2022-12-22 DIAGNOSIS — L989 Disorder of the skin and subcutaneous tissue, unspecified: Secondary | ICD-10-CM | POA: Diagnosis not present

## 2022-12-22 DIAGNOSIS — Z131 Encounter for screening for diabetes mellitus: Secondary | ICD-10-CM

## 2022-12-22 MED ORDER — CHLORHEXIDINE GLUCONATE 4 % EX SOLN
Freq: Every day | CUTANEOUS | 5 refills | Status: AC | PRN
Start: 2022-12-22 — End: ?

## 2022-12-22 NOTE — Assessment & Plan Note (Signed)
Labs today  Encourage healthy lifestyle

## 2022-12-22 NOTE — Telephone Encounter (Signed)
It looks like I prescribed compazine in 07/2022 instead of the promethazine. Is that what she is talking about?

## 2022-12-22 NOTE — Telephone Encounter (Signed)
Patient would like something for nausea. She started her period and is having nausea with it. Patient state that her insurance company wouldn't cover promethazine and would like something different sent in. Armandina Stammer RN

## 2022-12-22 NOTE — Patient Instructions (Signed)
Unsure of link between recent folic acid and iron with the boils/abscesses. Recommend you continue good hygiene. Adding Hibiclens solution you can use to wash the areas that are prone to lesions. Apply warm compresses when you notice them start to come up. Current lesion is small and appears like we can manage this way for now. If they become larger, painful, swollen, red, warm, etc then please let us take a look in case antibiotics are necessary.

## 2022-12-22 NOTE — Progress Notes (Signed)
Acute Office Visit  Subjective:     Patient ID: Sherry Villegas, female    DOB: 01-Jan-1986, 37 y.o.   MRN: [id number]  Chief Complaint  Patient presents with   Skin Problem    HPI Patient is in today for recurrent boils  Discussed the use of AI scribe software for clinical note transcription with the patient, who gave verbal consent to proceed.  History of Present Illness   The patient presents with recurrent boils, which she reports started after beginning iron infusions and folic acid. She has a family history of diabetes, with several relatives being diagnosed after experiencing recurrent boils. The patient has had 1-2 boils in the past, but never this frequently. The boils are painful and come to a head before being manually drained by the patient. The boils have occurred in various locations, including under the arms and between the buttocks and between the breasts.  She has not noticed any boils recurring in the same location.         ROS All review of systems negative except what is listed in the HPI      Objective:    BP 125/75   Pulse 84   Ht 5\' 4"  (1.626 m)   Wt 255 lb (115.7 kg)   SpO2 100%   BMI 43.77 kg/m    Physical Exam Vitals reviewed.  Constitutional:      Appearance: Normal appearance.  Skin:    General: Skin is warm and dry.     Comments: Small pea-size lesion under left arm; no surrounding erythema, edema, fluctuance, induration, warmth, drainage  Neurological:     General: No focal deficit present.     Mental Status: She is alert and oriented to person, place, and time. Mental status is at baseline.  Psychiatric:        Mood and Affect: Mood normal.        Behavior: Behavior normal.        Thought Content: Thought content normal.        Judgment: Judgment normal.     No results found for any visits on 12/22/22.      Assessment & Plan:   Problem List Items Addressed This Visit     Morbid obesity (HCC) - Primary    Labs today   Encourage healthy lifestyle  Possible Diabetes: Family history of diabetes diagnosis following recurrent boils. Recent self-check of glucose was 115, non-fasting. -Order metabolic panel and HbA1c for diabetes screening today at patient request      Relevant Orders   CBC with Differential/Platelet   Comprehensive metabolic panel   Hemoglobin A1c   Other Visit Diagnoses     Skin lesion     Recurrent Boils: Recent onset of recurrent boils, patient concerned about linked to initiation of iron infusions and folic acid. No clear link between these medications and boils. Differential includes Hidradenitis Suppurativa (HS), but no recurrent lesions in the same area. Current lesion is small and not significantly infected. -Use Hibiclens (chlorhexidine) daily in areas prone to boils. -Apply warm compresses to current area -Consider antibiotics if future boils appear infected (red, hot, swollen). -Consider dermatology referral if boils persist.   Relevant Medications   chlorhexidine (HIBICLENS) 4 % external liquid   Other Relevant Orders   CBC with Differential/Platelet   Comprehensive metabolic panel   Screening for diabetes mellitus       Relevant Orders   Hemoglobin A1c  Meds ordered this encounter  Medications   chlorhexidine (HIBICLENS) 4 % external liquid    Sig: Apply topically daily as needed.    Dispense:  473 mL    Refill:  5    Order Specific Question:   Supervising Provider    Answer:   Danise Edge A [[id number]]    Return if symptoms worsen or fail to improve.  Clayborne Dana, NP

## 2022-12-23 ENCOUNTER — Inpatient Hospital Stay: Payer: BC Managed Care – PPO

## 2022-12-23 LAB — COMPREHENSIVE METABOLIC PANEL
ALT: 11 U/L (ref 0–35)
AST: 13 U/L (ref 0–37)
Albumin: 4 g/dL (ref 3.5–5.2)
Alkaline Phosphatase: 68 U/L (ref 39–117)
BUN: 10 mg/dL (ref 6–23)
CO2: 29 mEq/L (ref 19–32)
Calcium: 9.4 mg/dL (ref 8.4–10.5)
Chloride: 100 mEq/L (ref 96–112)
Creatinine, Ser: 1.01 mg/dL (ref 0.40–1.20)
GFR: 72.07 mL/min (ref >60.00)
Glucose, Bld: 90 mg/dL (ref 70–99)
Potassium: 3.7 mEq/L (ref 3.5–5.1)
Sodium: 138 mEq/L (ref 135–145)
Total Bilirubin: 0.3 mg/dL (ref 0.2–1.2)
Total Protein: 7.8 g/dL (ref 6.0–8.3)

## 2022-12-23 LAB — CBC WITH DIFFERENTIAL/PLATELET
Basophils Absolute: 0 10*3/uL (ref 0.0–0.1)
Basophils Relative: 0.6 % (ref 0.0–3.0)
Eosinophils Absolute: 0.1 10*3/uL (ref 0.0–0.7)
Eosinophils Relative: 1.7 % (ref 0.0–5.0)
HCT: 38.2 % (ref 36.0–46.0)
Hemoglobin: 11.9 g/dL — ABNORMAL LOW (ref 12.0–15.0)
Lymphocytes Relative: 23.6 % (ref 12.0–46.0)
Lymphs Abs: 1.4 10*3/uL (ref 0.7–4.0)
MCHC: 31.2 g/dL (ref 30.0–36.0)
MCV: 81.5 fl (ref 78.0–100.0)
Monocytes Absolute: 0.3 10*3/uL (ref 0.1–1.0)
Monocytes Relative: 5.4 % (ref 3.0–12.0)
Neutro Abs: 4.2 10*3/uL (ref 1.4–7.7)
Neutrophils Relative %: 68.7 % (ref 43.0–77.0)
Platelets: 347 10*3/uL (ref 150.0–400.0)
RBC: 4.68 Mil/uL (ref 3.87–5.11)
RDW: 26.1 % — ABNORMAL HIGH (ref 11.5–15.5)
WBC: 6 10*3/uL (ref 4.0–10.5)

## 2022-12-23 LAB — HEMOGLOBIN A1C: Hgb A1c MFr Bld: 5.6 % (ref 4.6–6.5)

## 2022-12-23 MED ORDER — PROMETHAZINE HCL 25 MG PO TABS: 12.5000 mg | ORAL_TABLET | Freq: Four times a day (QID) | ORAL | 2 refills | Status: AC | PRN

## 2022-12-23 NOTE — Telephone Encounter (Signed)
Ok, I've sent in phenergan instead.

## 2022-12-24 ENCOUNTER — Other Ambulatory Visit: Payer: Self-pay | Admitting: Family Medicine

## 2022-12-24 DIAGNOSIS — I10 Essential (primary) hypertension: Secondary | ICD-10-CM

## 2022-12-29 ENCOUNTER — Other Ambulatory Visit: Payer: Self-pay | Admitting: Family Medicine

## 2022-12-29 ENCOUNTER — Encounter: Payer: Self-pay | Admitting: Family Medicine

## 2022-12-29 DIAGNOSIS — I10 Essential (primary) hypertension: Secondary | ICD-10-CM

## 2022-12-30 ENCOUNTER — Inpatient Hospital Stay: Payer: BC Managed Care – PPO

## 2023-01-05 ENCOUNTER — Ambulatory Visit: Payer: BC Managed Care – PPO | Admitting: Family Medicine

## 2023-01-13 ENCOUNTER — Inpatient Hospital Stay: Payer: BC Managed Care – PPO | Attending: Hematology & Oncology

## 2023-01-13 VITALS — BP 126/66 | HR 79 | Temp 97.7°F | Resp 17

## 2023-01-13 DIAGNOSIS — N920 Excessive and frequent menstruation with regular cycle: Secondary | ICD-10-CM

## 2023-01-13 DIAGNOSIS — D500 Iron deficiency anemia secondary to blood loss (chronic): Secondary | ICD-10-CM

## 2023-01-13 DIAGNOSIS — D509 Iron deficiency anemia, unspecified: Secondary | ICD-10-CM | POA: Diagnosis not present

## 2023-01-13 MED ORDER — SODIUM CHLORIDE 0.9% FLUSH: 3.0000 mL | Freq: Once | INTRAVENOUS | Status: DC | PRN

## 2023-01-13 MED ORDER — SODIUM CHLORIDE 0.9 % IV SOLN: Freq: Once | INTRAVENOUS | Status: AC

## 2023-01-13 MED ORDER — SODIUM CHLORIDE 0.9% FLUSH: 10.0000 mL | Freq: Once | INTRAVENOUS | Status: DC | PRN

## 2023-01-13 MED ORDER — SODIUM CHLORIDE 0.9 % IV SOLN
300.0000 mg | Freq: Once | INTRAVENOUS | Status: AC
  Administered 2023-01-13: 300 mg via INTRAVENOUS
  Filled 2023-01-13: qty 300

## 2023-01-13 NOTE — Patient Instructions (Signed)

## 2023-01-24 ENCOUNTER — Ambulatory Visit: Payer: BC Managed Care – PPO

## 2023-01-26 ENCOUNTER — Ambulatory Visit (INDEPENDENT_AMBULATORY_CARE_PROVIDER_SITE_OTHER): Payer: BC Managed Care – PPO

## 2023-01-26 DIAGNOSIS — L501 Idiopathic urticaria: Secondary | ICD-10-CM | POA: Diagnosis not present

## 2023-01-26 MED ORDER — OMALIZUMAB 150 MG ~~LOC~~ SOLR
300.0000 mg | SUBCUTANEOUS | Status: AC
  Administered 2023-01-26 – 2023-02-23 (×2): 300 mg via SUBCUTANEOUS

## 2023-02-08 ENCOUNTER — Encounter (INDEPENDENT_AMBULATORY_CARE_PROVIDER_SITE_OTHER): Payer: Self-pay

## 2023-02-08 ENCOUNTER — Telehealth: Payer: Self-pay | Admitting: Internal Medicine

## 2023-02-15 ENCOUNTER — Encounter: Payer: Self-pay | Admitting: Family

## 2023-02-15 ENCOUNTER — Inpatient Hospital Stay: Payer: BC Managed Care – PPO | Admitting: Family

## 2023-02-15 ENCOUNTER — Inpatient Hospital Stay: Payer: BC Managed Care – PPO | Attending: Hematology & Oncology

## 2023-02-15 VITALS — BP 122/75 | HR 90 | Temp 98.7°F | Resp 17 | Wt 262.1 lb

## 2023-02-15 DIAGNOSIS — N920 Excessive and frequent menstruation with regular cycle: Secondary | ICD-10-CM

## 2023-02-15 DIAGNOSIS — N921 Excessive and frequent menstruation with irregular cycle: Secondary | ICD-10-CM | POA: Diagnosis not present

## 2023-02-15 DIAGNOSIS — D509 Iron deficiency anemia, unspecified: Secondary | ICD-10-CM | POA: Diagnosis not present

## 2023-02-15 DIAGNOSIS — D563 Thalassemia minor: Secondary | ICD-10-CM

## 2023-02-15 DIAGNOSIS — D500 Iron deficiency anemia secondary to blood loss (chronic): Secondary | ICD-10-CM

## 2023-02-15 LAB — RETICULOCYTES
Immature Retic Fract: 21.8 % — ABNORMAL HIGH (ref 2.3–15.9)
RBC.: 4.57 MIL/uL (ref 3.87–5.11)
Retic Count, Absolute: 83.6 10*3/uL (ref 19.0–186.0)
Retic Ct Pct: 1.8 % (ref 0.4–3.1)

## 2023-02-15 LAB — CBC WITH DIFFERENTIAL (CANCER CENTER ONLY)
Abs Immature Granulocytes: 0.02 K/uL (ref 0.00–0.07)
Basophils Absolute: 0 K/uL (ref 0.0–0.1)
Basophils Relative: 0 %
Eosinophils Absolute: 0.1 K/uL (ref 0.0–0.5)
Eosinophils Relative: 2 %
HCT: 39.7 % (ref 36.0–46.0)
Hemoglobin: 12.4 g/dL (ref 12.0–15.0)
Immature Granulocytes: 0 %
Lymphocytes Relative: 29 %
Lymphs Abs: 1.8 K/uL (ref 0.7–4.0)
MCH: 26.8 pg (ref 26.0–34.0)
MCHC: 31.2 g/dL (ref 30.0–36.0)
MCV: 85.7 fL (ref 80.0–100.0)
Monocytes Absolute: 0.3 K/uL (ref 0.1–1.0)
Monocytes Relative: 6 %
Neutro Abs: 3.9 K/uL (ref 1.7–7.7)
Neutrophils Relative %: 63 %
Platelet Count: 345 K/uL (ref 150–400)
RBC: 4.63 MIL/uL (ref 3.87–5.11)
RDW: 16.9 % — ABNORMAL HIGH (ref 11.5–15.5)
WBC Count: 6.2 K/uL (ref 4.0–10.5)
nRBC: 0 % (ref 0.0–0.2)

## 2023-02-15 LAB — FERRITIN: Ferritin: 128 ng/mL (ref 11–307)

## 2023-02-15 NOTE — Progress Notes (Signed)
Hematology and Oncology Follow Up Visit  Sherry Villegas [id number] 10-23-1985 37 y.o. 02/15/2023   Principle Diagnosis:  Iron deficiency anemia  Alpha thalassemia minor trait   Current Therapy:  IV iron as indicated Folic acid 1 mg PO daily   Interim History:  Sherry Villegas is here today for follow-up. She is doing well but does note some fatigue with trouble sleeping. She plans to discuss this with her PCP.  Her cycle is now irregular and has occurred every 15 days for the past 2 months with heavy flow. No other blood loss noted.  No abnormal bruising, no petechiae.  She has had some dizziness at times. Thankfully she has not had any falls or syncope.  No fever, chills, n/v, cough, rash, SOB, chest pain, palpitations, abdominal pain or changes in bowel or bladder habits.  Occasional mild puffiness in her ankles and feet after a long shift at work. This resolves with propping up her feet.  Numbness and tingling in her hands and feet comes and goes.  Appetite and hydration are good. Weight is 262 lbs.   ECOG Performance Status: 1 - Symptomatic but completely ambulatory  Medications:  Allergies as of 02/15/2023       Reactions   Amoxil [amoxicillin] Swelling   Tongue was swollen   Dulera [mometasone Furo-formoterol Fum] Hives   Influenza Vaccines Itching        Medication List        Accurate as of February 15, 2023  3:04 PM. If you have any questions, ask your nurse or doctor.          acetaminophen 325 MG tablet Commonly known as: TYLENOL Take 650 mg by mouth every 6 (six) hours as needed.   albuterol 108 (90 Base) MCG/ACT inhaler Commonly known as: VENTOLIN HFA Inhale 2 puffs into the lungs every 6 (six) hours as needed for wheezing or shortness of breath.   amLODipine 5 MG tablet Commonly known as: NORVASC Take 1 tablet (5 mg total) by mouth daily.   budesonide-formoterol 80-4.5 MCG/ACT inhaler Commonly known as: SYMBICORT Inhale 2 puffs into the lungs in the  morning and at bedtime.   chlorhexidine 4 % external liquid Commonly known as: HIBICLENS Apply topically daily as needed.   diclofenac 75 MG EC tablet Commonly known as: VOLTAREN Take 1 tablet (75 mg total) by mouth 2 (two) times daily with a meal.   dicyclomine 20 MG tablet Commonly known as: BENTYL Take 20 mg by mouth 3 (three) times daily before meals.   EPINEPHrine 0.3 mg/0.3 mL Soaj injection Commonly known as: EPI-PEN Inject 0.3 mg into the muscle as needed for anaphylaxis.   EXCEDRIN PO Take by mouth. Prn   famotidine 20 MG tablet Commonly known as: Pepcid Take 1 tablet (20 mg total) by mouth daily.   ferrous sulfate 324 (65 Fe) MG Tbec Take 1 tablet by mouth daily.   fexofenadine 60 MG tablet Commonly known as: ALLEGRA Take 60 mg by mouth 2 (two) times daily.   folic acid 1 MG tablet Commonly known as: FOLVITE Take 1 tablet (1 mg total) by mouth daily.   hydrochlorothiazide 25 MG tablet Commonly known as: HYDRODIURIL TAKE 1 TABLET (25 MG TOTAL) BY MOUTH DAILY.   loratadine 10 MG tablet Commonly known as: CLARITIN Take 10 mg by mouth daily.   metoprolol succinate 25 MG 24 hr tablet Commonly known as: TOPROL-XL Take 1 tablet (25 mg total) by mouth daily.   omalizumab 150 MG/ML prefilled syringe Commonly  known as: Xolair Inject 300 mg into the skin every 28 (twenty-eight) days.   omeprazole 40 MG capsule Commonly known as: PRILOSEC Take 40 mg by mouth 2 (two) times daily.   promethazine 25 MG tablet Commonly known as: PHENERGAN Take 0.5-1 tablets (12.5-25 mg total) by mouth every 6 (six) hours as needed for nausea or vomiting.   valACYclovir 1000 MG tablet Commonly known as: VALTREX Take 1 tablet (1,000 mg total) by mouth 2 (two) times daily.   Vitamin D (Ergocalciferol) 1.25 MG (50000 UNIT) Caps capsule Commonly known as: DRISDOL Take 1 capsule (50,000 Units total) by mouth every 7 (seven) days.        Allergies:  Allergies  Allergen  Reactions   Amoxil [Amoxicillin] Swelling    Tongue was swollen   Dulera [Mometasone Furo-Formoterol Fum] Hives   Influenza Vaccines Itching    Past Medical History, Surgical history, Social history, and Family History were reviewed and updated.  Review of Systems: All other 10 point review of systems is negative.   Physical Exam:  weight is 262 lb 1.9 oz (118.9 kg). Her oral temperature is 98.7 F (37.1 C). Her blood pressure is 122/75 and her pulse is 90. Her respiration is 17 and oxygen saturation is 100%.   Wt Readings from Last 3 Encounters:  02/15/23 262 lb 1.9 oz (118.9 kg)  12/22/22 255 lb (115.7 kg)  11/23/22 255 lb 1.9 oz (115.7 kg)    Ocular: Sclerae unicteric, pupils equal, round and reactive to light Ear-nose-throat: Oropharynx clear, dentition fair Lymphatic: No cervical or supraclavicular adenopathy Lungs no rales or rhonchi, good excursion bilaterally Heart regular rate and rhythm, no murmur appreciated Abd soft, nontender, positive bowel sounds MSK no focal spinal tenderness, no joint edema Neuro: non-focal, well-oriented, appropriate affect Breasts: Deferred   Lab Results  Component Value Date   WBC 6.2 02/15/2023   HGB 12.4 02/15/2023   HCT 39.7 02/15/2023   MCV 85.7 02/15/2023   PLT 345 02/15/2023   Lab Results  Component Value Date   FERRITIN 49 11/23/2022   IRON 35 11/23/2022   TIBC 384 11/23/2022   UIBC 349 11/23/2022   IRONPCTSAT 9 (L) 11/23/2022   Lab Results  Component Value Date   RETICCTPCT 1.8 02/15/2023   RBC 4.57 02/15/2023   RBC 4.63 02/15/2023   No results found for: "KPAFRELGTCHN", "LAMBDASER", "KAPLAMBRATIO" No results found for: "IGGSERUM", "IGA", "IGMSERUM" No results found for: "TOTALPROTELP", "ALBUMINELP", "A1GS", "A2GS", "BETS", "BETA2SER", "GAMS", "MSPIKE", "SPEI"   Chemistry      Component Value Date/Time   NA 138 12/22/2022 1624   NA 138 03/10/2022 1640   K 3.7 12/22/2022 1624   CL 100 12/22/2022 1624   CO2 29  12/22/2022 1624   BUN 10 12/22/2022 1624   BUN 11 03/10/2022 1640   CREATININE 1.01 12/22/2022 1624   CREATININE 0.94 09/27/2022 1441   CREATININE 0.89 09/09/2022 1514      Component Value Date/Time   CALCIUM 9.4 12/22/2022 1624   ALKPHOS 68 12/22/2022 1624   AST 13 12/22/2022 1624   AST 21 09/27/2022 1441   ALT 11 12/22/2022 1624   ALT 11 09/27/2022 1441   BILITOT 0.3 12/22/2022 1624   BILITOT 0.3 09/27/2022 1441       Impression and Plan: Ms. Sachdev is a very pleasant 37 yo African American female with iron deficiency anemia and alpha thalassemia minor trait.  Iron studies are pending.  Follow-up in 3 months.   Eileen Stanford, NP 8/7/20243:04 PM

## 2023-02-20 DIAGNOSIS — Z31430 Encounter of female for testing for genetic disease carrier status for procreative management: Secondary | ICD-10-CM | POA: Diagnosis not present

## 2023-02-20 DIAGNOSIS — Z319 Encounter for procreative management, unspecified: Secondary | ICD-10-CM | POA: Diagnosis not present

## 2023-02-20 DIAGNOSIS — E559 Vitamin D deficiency, unspecified: Secondary | ICD-10-CM | POA: Diagnosis not present

## 2023-02-20 DIAGNOSIS — D251 Intramural leiomyoma of uterus: Secondary | ICD-10-CM | POA: Diagnosis not present

## 2023-02-20 DIAGNOSIS — N979 Female infertility, unspecified: Secondary | ICD-10-CM | POA: Diagnosis not present

## 2023-02-20 DIAGNOSIS — E669 Obesity, unspecified: Secondary | ICD-10-CM | POA: Diagnosis not present

## 2023-02-23 ENCOUNTER — Ambulatory Visit (INDEPENDENT_AMBULATORY_CARE_PROVIDER_SITE_OTHER): Payer: BC Managed Care – PPO

## 2023-02-23 DIAGNOSIS — L501 Idiopathic urticaria: Secondary | ICD-10-CM

## 2023-02-27 ENCOUNTER — Encounter: Payer: Self-pay | Admitting: Family Medicine

## 2023-02-28 ENCOUNTER — Other Ambulatory Visit: Payer: Self-pay | Admitting: Family Medicine

## 2023-02-28 DIAGNOSIS — I10 Essential (primary) hypertension: Secondary | ICD-10-CM

## 2023-02-28 MED ORDER — LABETALOL HCL 100 MG PO TABS
100.0000 mg | ORAL_TABLET | Freq: Two times a day (BID) | ORAL | 2 refills | Status: DC
Start: 2023-02-28 — End: 2023-03-27

## 2023-03-06 ENCOUNTER — Other Ambulatory Visit: Payer: Self-pay

## 2023-03-06 NOTE — Telephone Encounter (Signed)
Pt called to speak to CMA regarding the mychart messages they were having. She said a message was missed and things have gotten confused so she wanted to speak to her instead. Please call to advise.

## 2023-03-10 ENCOUNTER — Encounter: Payer: Self-pay | Admitting: Family Medicine

## 2023-03-10 ENCOUNTER — Ambulatory Visit (INDEPENDENT_AMBULATORY_CARE_PROVIDER_SITE_OTHER): Payer: BC Managed Care – PPO | Admitting: Family Medicine

## 2023-03-10 VITALS — BP 110/80 | HR 99 | Temp 98.1°F | Resp 18 | Ht 64.0 in | Wt 262.2 lb

## 2023-03-10 DIAGNOSIS — I10 Essential (primary) hypertension: Secondary | ICD-10-CM | POA: Diagnosis not present

## 2023-03-10 DIAGNOSIS — E538 Deficiency of other specified B group vitamins: Secondary | ICD-10-CM | POA: Diagnosis not present

## 2023-03-10 DIAGNOSIS — Z0000 Encounter for general adult medical examination without abnormal findings: Secondary | ICD-10-CM | POA: Diagnosis not present

## 2023-03-10 DIAGNOSIS — K219 Gastro-esophageal reflux disease without esophagitis: Secondary | ICD-10-CM

## 2023-03-10 DIAGNOSIS — D649 Anemia, unspecified: Secondary | ICD-10-CM | POA: Diagnosis not present

## 2023-03-10 DIAGNOSIS — N898 Other specified noninflammatory disorders of vagina: Secondary | ICD-10-CM

## 2023-03-10 MED ORDER — FAMOTIDINE 20 MG PO TABS: 20.0000 mg | ORAL_TABLET | Freq: Every day | ORAL | 3 refills | Status: DC

## 2023-03-10 NOTE — Progress Notes (Signed)
Established Patient Office Visit  Subjective   Patient ID: Sherry Villegas, female    DOB: 02/15/1986  Age: 37 y.o. MRN: [id number]  Chief Complaint  Patient presents with   Annual Exam    Pt states fasting     HPI Discussed the use of AI scribe software for clinical note transcription with the patient, who gave verbal consent to proceed.  History of Present Illness   The patient, with a history of hypertension and fertility issues, presents for a routine follow-up. She was recently switched from amlodipine to levetolol by her fertility doctor, who also started her on semaglutide for weight loss. The patient reports feeling good on the new regimen, despite initial concerns about being on too much blood pressure medication. She has been monitoring her blood pressure at home, which has been consistently around 110/80.  The patient is also undergoing fertility treatment and has been diagnosed with a mild case of endometriosis. She is scheduled for a test to check for blocked tubes. She has been experiencing severe menstrual symptoms, which she hopes will improve with pregnancy.  In addition to these issues, the patient has a history of iron deficiency anemia and has undergone two rounds of iron infusions. She is currently taking folic acid and reports that her iron levels are doing well. She also has a history of asthma, which is currently well-controlled. She was previously on Xolair for hives, but discontinued due to cost.  The patient also reports occasional swelling and pain in her feet and ankles, which she attributes to her weight. She hopes this will improve with weight loss from the semaglutide. She also mentions a white coating on her tongue, which she has had for years and is unsure of the cause.      Patient Active Problem List   Diagnosis Date Noted   Nausea and vomiting 09/09/2022   Other fatigue 09/09/2022   Anemia 09/09/2022   B12 deficiency 09/09/2022   Abnormal thyroid  blood test 04/05/2022   Rash and other nonspecific skin eruption 09/29/2021   Other allergic rhinitis 09/29/2021   Moderate persistent asthma without complication 09/29/2021   Pelvic pain 05/11/2021   Cough 12/14/2020   Tinea corporis 12/14/2020   Gastroesophageal reflux disease 12/14/2020   Urticaria 12/14/2020   Hemorrhoids 12/14/2020   Iron deficiency anemia 07/02/2020   COVID-19 vaccination declined 06/30/2020   Ovulation pain 05/28/2020   Infertility, female 05/28/2020   Menorrhagia with regular cycle 05/28/2020   Dysmenorrhea 05/28/2020   Genital herpes 05/13/2019   Primary hypertension 02/27/2019   Morbid obesity (HCC) 02/06/2017   Migraines 09/10/2015   Asthmatic bronchitis , chronic 06/22/2015   Past Medical History:  Diagnosis Date   Anemia    Anxiety    Asthma    Back pain    Constipation    GERD (gastroesophageal reflux disease)    Hyperlipidemia    Hypertension    Migraine    Stomach ulcer    Vaginal Pap smear, abnormal    History reviewed. No pertinent surgical history. Social History   Tobacco Use   Smoking status: Every Day    Current packs/day: 0.25    Average packs/day: 0.3 packs/day for 15.0 years (3.8 ttl pk-yrs)    Types: Cigarettes   Smokeless tobacco: Never  Vaping Use   Vaping status: Never Used  Substance Use Topics   Alcohol use: Yes    Alcohol/week: 6.0 standard drinks of alcohol    Types: 6 Shots of liquor per week  Drug use: Yes    Types: Marijuana    Comment: special occassions only-- 1 x q6 months   Social History   Socioeconomic History   Marital status: Single    Spouse name: Not on file   Number of children: Not on file   Years of education: Not on file   Highest education level: Associate degree: occupational, Scientist, product/process development, or vocational program  Occupational History   Occupation: customer service rep    Occupation: Customer service rep  Tobacco Use   Smoking status: Every Day    Current packs/day: 0.25    Average  packs/day: 0.3 packs/day for 15.0 years (3.8 ttl pk-yrs)    Types: Cigarettes   Smokeless tobacco: Never  Vaping Use   Vaping status: Never Used  Substance and Sexual Activity   Alcohol use: Yes    Alcohol/week: 6.0 standard drinks of alcohol    Types: 6 Shots of liquor per week   Drug use: Yes    Types: Marijuana    Comment: special occassions only-- 1 x q6 months   Sexual activity: Yes    Partners: Male  Other Topics Concern   Not on file  Social History Narrative   Exercise--- no due to asthma   Social Determinants of Health   Financial Resource Strain: Not on file  Food Insecurity: No Food Insecurity (09/27/2022)   Hunger Vital Sign    Worried About Running Out of Food in the Last Year: Never true    Ran Out of Food in the Last Year: Never true  Transportation Needs: No Transportation Needs (09/27/2022)   PRAPARE - Administrator, Civil Service (Medical): No    Lack of Transportation (Non-Medical): No  Physical Activity: Not on file  Stress: Not on file  Social Connections: Not on file  Intimate Partner Violence: Not At Risk (09/27/2022)   Humiliation, Afraid, Rape, and Kick questionnaire    Fear of Current or Ex-Partner: No    Emotionally Abused: No    Physically Abused: No    Sexually Abused: No   Family Status  Relation Name Status   Mother  Deceased   Father  Deceased at age 75       MI   Brother  Alive   MGM  (Not Specified)   PGM  (Not Specified)   Youth worker  (Not Specified)  No partnership data on file   Family History  Problem Relation Age of Onset   Heart failure Mother    Hypertension Mother    Heart disease Mother 68       MI   Stroke Mother    Sudden death Mother    Cancer Father    Heart failure Father    Stroke Father    Hypertension Father    Diabetes Father    Heart attack Father    Dementia Father    Hypertension Brother    Diabetes Brother    Breast cancer Maternal Grandmother    Breast cancer Paternal Grandmother     Breast cancer Maternal Aunt    Allergies  Allergen Reactions   Amoxil [Amoxicillin] Swelling    Tongue was swollen   Dulera [Mometasone Furo-Formoterol Fum] Hives   Influenza Vaccines Itching      Review of Systems  Constitutional:  Negative for chills, fever and malaise/fatigue.  HENT:  Negative for congestion and hearing loss.   Eyes:  Negative for blurred vision and discharge.  Respiratory:  Negative for cough, sputum production and  shortness of breath.   Cardiovascular:  Negative for chest pain, palpitations and leg swelling.  Gastrointestinal:  Negative for abdominal pain, blood in stool, constipation, diarrhea, heartburn, nausea and vomiting.  Genitourinary:  Negative for dysuria, frequency, hematuria and urgency.  Musculoskeletal:  Negative for back pain, falls and myalgias.  Skin:  Negative for rash.  Neurological:  Negative for dizziness, sensory change, loss of consciousness, weakness and headaches.  Endo/Heme/Allergies:  Negative for environmental allergies. Does not bruise/bleed easily.  Psychiatric/Behavioral:  Negative for depression and suicidal ideas. The patient is not nervous/anxious and does not have insomnia.       Objective:     BP 110/80 (BP Location: Left Arm, Patient Position: Sitting, Cuff Size: Large)   Pulse 99   Temp 98.1 F (36.7 C) (Oral)   Resp 18   Ht 5\' 4"  (1.626 m)   Wt 262 lb 3.2 oz (118.9 kg)   LMP 02/11/2023   SpO2 98%   BMI 45.01 kg/m  BP Readings from Last 3 Encounters:  03/10/23 110/80  02/15/23 122/75  01/13/23 126/66   Wt Readings from Last 3 Encounters:  03/10/23 262 lb 3.2 oz (118.9 kg)  02/15/23 262 lb 1.9 oz (118.9 kg)  12/22/22 255 lb (115.7 kg)   SpO2 Readings from Last 3 Encounters:  03/10/23 98%  02/15/23 100%  01/13/23 99%      Physical Exam Vitals and nursing note reviewed.  Constitutional:      General: She is not in acute distress.    Appearance: Normal appearance. She is well-developed.  HENT:      Head: Normocephalic and atraumatic.     Right Ear: Tympanic membrane, ear canal and external ear normal. There is no impacted cerumen.     Left Ear: Tympanic membrane, ear canal and external ear normal. There is no impacted cerumen.     Nose: Nose normal.     Mouth/Throat:     Mouth: Mucous membranes are moist.     Pharynx: Oropharynx is clear. No oropharyngeal exudate or posterior oropharyngeal erythema.  Eyes:     General: No scleral icterus.       Right eye: No discharge.        Left eye: No discharge.     Conjunctiva/sclera: Conjunctivae normal.     Pupils: Pupils are equal, round, and reactive to light.  Neck:     Thyroid: No thyromegaly or thyroid tenderness.     Vascular: No JVD.  Cardiovascular:     Rate and Rhythm: Normal rate and regular rhythm.     Heart sounds: Normal heart sounds. No murmur heard. Pulmonary:     Effort: Pulmonary effort is normal. No respiratory distress.     Breath sounds: Normal breath sounds.  Abdominal:     General: Bowel sounds are normal. There is no distension.     Palpations: Abdomen is soft. There is no mass.     Tenderness: There is no abdominal tenderness. There is no guarding or rebound.  Genitourinary:    Vagina: Normal.  Musculoskeletal:        General: Normal range of motion.     Cervical back: Normal range of motion and neck supple.     Right lower leg: No edema.     Left lower leg: No edema.  Lymphadenopathy:     Cervical: No cervical adenopathy.  Skin:    General: Skin is warm and dry.     Findings: No erythema or rash.  Neurological:  Mental Status: She is alert and oriented to person, place, and time.     Cranial Nerves: No cranial nerve deficit.     Deep Tendon Reflexes: Reflexes are normal and symmetric.  Psychiatric:        Mood and Affect: Mood normal.        Behavior: Behavior normal.        Thought Content: Thought content normal.        Judgment: Judgment normal.      No results found for any visits on  03/10/23.    The ASCVD Risk score (Arnett DK, et al., 2019) failed to calculate for the following reasons:   The 2019 ASCVD risk score is only valid for ages 66 to 61    Assessment & Plan:   Problem List Items Addressed This Visit       Unprioritized   Gastroesophageal reflux disease   Relevant Medications   famotidine (PEPCID) 20 MG tablet   Primary hypertension - Primary   Relevant Orders   CBC with Differential/Platelet   Comprehensive metabolic panel   Lipid panel   TSH   B12 deficiency   Relevant Orders   CBC with Differential/Platelet   Vitamin B12   Anemia   Relevant Orders   CBC with Differential/Platelet   Morbid obesity (HCC)   Relevant Orders   VITAMIN D 25 Hydroxy (Vit-D Deficiency, Fractures)   Other Visit Diagnoses     Preventative health care       Relevant Orders   CBC with Differential/Platelet   Comprehensive metabolic panel   Lipid panel   TSH   Vaginal discharge       Relevant Medications   famotidine (PEPCID) 20 MG tablet     Assessment and Plan    Hypertension Well controlled on current medication regimen. Blood pressure at the visit was 110/80. Patient recently started on semaglutide by fertility specialist. -Continue current medication regimen.  Endometriosis Suspected mild case based on symptoms. Patient is undergoing fertility treatment and has been advised to lose weight. -Continue with fertility specialist's plan.  Gastroesophageal Reflux Disease (GERD) Patient reports history of acid reflux. Currently taking Pepcid twice daily, but sometimes only takes it once. -Reduce Pepcid to once daily.  Asthma Patient reports asthma is well controlled. Previously on Xolair for hives, but discontinued due to cost. Currently using inhaler as needed. -Continue current management plan.  Weight Management Patient recently started on compounded semaglutide for weight loss as part of fertility treatment plan. Reports feeling more energized  since starting the medication. -Continue semaglutide as directed by fertility specialist.  General Health Maintenance -Consider flu shot.        No follow-ups on file.    Donato Schultz, DO

## 2023-03-11 ENCOUNTER — Encounter: Payer: Self-pay | Admitting: Family Medicine

## 2023-03-14 ENCOUNTER — Telehealth: Payer: Self-pay | Admitting: Family Medicine

## 2023-03-14 ENCOUNTER — Other Ambulatory Visit: Payer: Self-pay

## 2023-03-14 MED ORDER — OMEPRAZOLE 40 MG PO CPDR: 40.0000 mg | DELAYED_RELEASE_CAPSULE | Freq: Two times a day (BID) | ORAL | 1 refills | Status: DC

## 2023-03-14 NOTE — Telephone Encounter (Signed)
Pt would like a call back to discuss wegovy prescription. Pt said that her insurance had some changes that were effective on 03/12/23 which means it could be covered now. Pt is requesting callback to see whether the prescription can be filled by Dr. Laury Axon.

## 2023-03-15 NOTE — Telephone Encounter (Signed)
Pt called. LVM regarding calling insurance.

## 2023-03-15 NOTE — Telephone Encounter (Signed)
Pt called back. °

## 2023-03-16 NOTE — Telephone Encounter (Signed)
Responded via Mychart.

## 2023-03-17 LAB — COMPREHENSIVE METABOLIC PANEL
AG Ratio: 1.3 (calc) (ref 1.0–2.5)
ALT: 11 U/L (ref 6–29)
AST: 12 U/L (ref 10–30)
Albumin: 3.9 g/dL (ref 3.6–5.1)
Alkaline phosphatase (APISO): 64 U/L (ref 31–125)
BUN: 11 mg/dL (ref 7–25)
CO2: 22 mmol/L (ref 20–32)
Calcium: 9.4 mg/dL (ref 8.6–10.2)
Chloride: 108 mmol/L (ref 98–110)
Creat: 0.97 mg/dL (ref 0.50–0.97)
Globulin: 3 g/dL (ref 1.9–3.7)
Glucose, Bld: 75 mg/dL (ref 65–99)
Potassium: 3.8 mmol/L (ref 3.5–5.3)
Sodium: 140 mmol/L (ref 135–146)
Total Bilirubin: 0.3 mg/dL (ref 0.2–1.2)
Total Protein: 6.9 g/dL (ref 6.1–8.1)

## 2023-03-17 LAB — CBC WITH DIFFERENTIAL/PLATELET
Absolute Monocytes: 436 {cells}/uL (ref 200–950)
Basophils Absolute: 20 {cells}/uL (ref 0–200)
Basophils Relative: 0.3 %
Eosinophils Absolute: 119 cells/uL (ref 15–500)
Eosinophils Relative: 1.8 %
HCT: 34.9 % — ABNORMAL LOW (ref 35.0–45.0)
Hemoglobin: 11.5 g/dL — ABNORMAL LOW (ref 11.7–15.5)
Lymphs Abs: 1657 {cells}/uL (ref 850–3900)
MCH: 27.2 pg (ref 27.0–33.0)
MCHC: 33 g/dL (ref 32.0–36.0)
MCV: 82.5 fL (ref 80.0–100.0)
MPV: 9.5 fL (ref 7.5–12.5)
Monocytes Relative: 6.6 %
Neutro Abs: 4369 cells/uL (ref 1500–7800)
Neutrophils Relative %: 66.2 %
Platelets: 327 10*3/uL (ref 140–400)
RBC: 4.23 10*6/uL (ref 3.80–5.10)
RDW: 15.6 % — ABNORMAL HIGH (ref 11.0–15.0)
Total Lymphocyte: 25.1 %
WBC: 6.6 10*3/uL (ref 3.8–10.8)

## 2023-03-17 LAB — VITAMIN B12: Vitamin B-12: 496 pg/mL (ref 200–1100)

## 2023-03-17 LAB — LIPID PANEL
Cholesterol: 186 mg/dL (ref <200)
HDL: 51 mg/dL (ref >=50)
LDL Cholesterol (Calc): 109 mg/dL — ABNORMAL HIGH
Non-HDL Cholesterol (Calc): 135 mg/dL — ABNORMAL HIGH (ref <130)
Total CHOL/HDL Ratio: 3.6 (calc) (ref <5.0)
Triglycerides: 144 mg/dL (ref <150)

## 2023-03-17 LAB — VITAMIN D 25 HYDROXY (VIT D DEFICIENCY, FRACTURES): Vit D, 25-Hydroxy: 18 ng/mL — ABNORMAL LOW (ref 30–100)

## 2023-03-17 LAB — TSH: TSH: 1.46 m[IU]/L

## 2023-03-25 ENCOUNTER — Other Ambulatory Visit: Payer: Self-pay | Admitting: Family Medicine

## 2023-03-25 DIAGNOSIS — I10 Essential (primary) hypertension: Secondary | ICD-10-CM

## 2023-04-07 ENCOUNTER — Emergency Department (HOSPITAL_BASED_OUTPATIENT_CLINIC_OR_DEPARTMENT_OTHER): Payer: BC Managed Care – PPO

## 2023-04-07 ENCOUNTER — Encounter (HOSPITAL_BASED_OUTPATIENT_CLINIC_OR_DEPARTMENT_OTHER): Payer: Self-pay

## 2023-04-07 ENCOUNTER — Emergency Department (HOSPITAL_BASED_OUTPATIENT_CLINIC_OR_DEPARTMENT_OTHER)
Admission: EM | Admit: 2023-04-07 | Discharge: 2023-04-07 | Disposition: A | Discharge status: Home / Self Care | Payer: BC Managed Care – PPO | Attending: Emergency Medicine | Admitting: Emergency Medicine

## 2023-04-07 ENCOUNTER — Other Ambulatory Visit: Payer: Self-pay

## 2023-04-07 DIAGNOSIS — Z041 Encounter for examination and observation following transport accident: Secondary | ICD-10-CM | POA: Diagnosis not present

## 2023-04-07 DIAGNOSIS — Z79899 Other long term (current) drug therapy: Secondary | ICD-10-CM | POA: Diagnosis not present

## 2023-04-07 DIAGNOSIS — I10 Essential (primary) hypertension: Secondary | ICD-10-CM | POA: Diagnosis not present

## 2023-04-07 DIAGNOSIS — Y92410 Unspecified street and highway as the place of occurrence of the external cause: Secondary | ICD-10-CM | POA: Diagnosis not present

## 2023-04-07 DIAGNOSIS — M545 Low back pain, unspecified: Secondary | ICD-10-CM | POA: Diagnosis not present

## 2023-04-07 DIAGNOSIS — M25521 Pain in right elbow: Secondary | ICD-10-CM | POA: Insufficient documentation

## 2023-04-07 DIAGNOSIS — M47816 Spondylosis without myelopathy or radiculopathy, lumbar region: Secondary | ICD-10-CM | POA: Diagnosis not present

## 2023-04-07 DIAGNOSIS — M542 Cervicalgia: Secondary | ICD-10-CM | POA: Insufficient documentation

## 2023-04-07 DIAGNOSIS — J45909 Unspecified asthma, uncomplicated: Secondary | ICD-10-CM | POA: Insufficient documentation

## 2023-04-07 LAB — PREGNANCY, URINE: Preg Test, Ur: NEGATIVE
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
## 2023-04-07 NOTE — Discharge Instructions (Signed)
Thank you for letting us take care of you today.  Your x-ray of your lower back was negative for acute fracture. Your XR of shoulder was negative for fracture  Please use lidocaine patches for muscle pain and you can use Voltaren gel for spine pain.  You may also use Tylenol or ibuprofen as needed for pain  Return to emergency department if you experience loss of sensation in pubic region, incontinence of urine or bowel

## 2023-04-16 ENCOUNTER — Other Ambulatory Visit: Payer: Self-pay | Admitting: Family Medicine

## 2023-04-16 DIAGNOSIS — I10 Essential (primary) hypertension: Secondary | ICD-10-CM

## 2023-04-28 ENCOUNTER — Telehealth: Payer: Self-pay | Admitting: Internal Medicine

## 2023-04-28 NOTE — Telephone Encounter (Signed)
Left message to schedule Xolair reapproval appointment.

## 2023-05-25 ENCOUNTER — Inpatient Hospital Stay (HOSPITAL_BASED_OUTPATIENT_CLINIC_OR_DEPARTMENT_OTHER): Payer: BC Managed Care – PPO | Admitting: Medical Oncology

## 2023-05-25 ENCOUNTER — Ambulatory Visit: Payer: BC Managed Care – PPO | Admitting: Family Medicine

## 2023-05-25 ENCOUNTER — Inpatient Hospital Stay: Payer: BC Managed Care – PPO | Attending: Hematology & Oncology

## 2023-05-25 ENCOUNTER — Other Ambulatory Visit (HOSPITAL_COMMUNITY)
Admission: RE | Admit: 2023-05-25 | Discharge: 2023-05-25 | Disposition: A | Discharge status: Home / Self Care | Payer: BC Managed Care – PPO | Source: Ambulatory Visit | Attending: Family Medicine | Admitting: Family Medicine

## 2023-05-25 ENCOUNTER — Encounter: Payer: Self-pay | Admitting: Medical Oncology

## 2023-05-25 ENCOUNTER — Encounter: Payer: Self-pay | Admitting: Family Medicine

## 2023-05-25 VITALS — BP 116/69 | HR 81 | Ht 64.0 in | Wt 253.0 lb

## 2023-05-25 VITALS — BP 127/76 | HR 94 | Temp 98.4°F | Resp 18 | Wt 255.0 lb

## 2023-05-25 DIAGNOSIS — D563 Thalassemia minor: Secondary | ICD-10-CM | POA: Insufficient documentation

## 2023-05-25 DIAGNOSIS — N920 Excessive and frequent menstruation with regular cycle: Secondary | ICD-10-CM

## 2023-05-25 DIAGNOSIS — D509 Iron deficiency anemia, unspecified: Secondary | ICD-10-CM | POA: Insufficient documentation

## 2023-05-25 DIAGNOSIS — D500 Iron deficiency anemia secondary to blood loss (chronic): Secondary | ICD-10-CM

## 2023-05-25 DIAGNOSIS — Z1151 Encounter for screening for human papillomavirus (HPV): Secondary | ICD-10-CM | POA: Diagnosis not present

## 2023-05-25 DIAGNOSIS — Z01419 Encounter for gynecological examination (general) (routine) without abnormal findings: Secondary | ICD-10-CM

## 2023-05-25 LAB — CBC WITH DIFFERENTIAL (CANCER CENTER ONLY)
Abs Immature Granulocytes: 0.02 10*3/uL (ref 0.00–0.07)
Basophils Absolute: 0 10*3/uL (ref 0.0–0.1)
Basophils Relative: 0 %
Eosinophils Absolute: 0.1 10*3/uL (ref 0.0–0.5)
Eosinophils Relative: 2 %
HCT: 39.8 % (ref 36.0–46.0)
Hemoglobin: 12.5 g/dL (ref 12.0–15.0)
Immature Granulocytes: 0 %
Lymphocytes Relative: 25 %
Lymphs Abs: 1.5 10*3/uL (ref 0.7–4.0)
MCH: 26.9 pg (ref 26.0–34.0)
MCHC: 31.4 g/dL (ref 30.0–36.0)
MCV: 85.6 fL (ref 80.0–100.0)
Monocytes Absolute: 0.3 10*3/uL (ref 0.1–1.0)
Monocytes Relative: 6 %
Neutro Abs: 4.1 10*3/uL (ref 1.7–7.7)
Neutrophils Relative %: 67 %
Platelet Count: 335 10*3/uL (ref 150–400)
RBC: 4.65 MIL/uL (ref 3.87–5.11)
RDW: 14.4 % (ref 11.5–15.5)
WBC Count: 6.1 10*3/uL (ref 4.0–10.5)
nRBC: 0 % (ref 0.0–0.2)

## 2023-05-25 LAB — RETICULOCYTES
Immature Retic Fract: 22 % — ABNORMAL HIGH (ref 2.3–15.9)
RBC.: 4.63 MIL/uL (ref 3.87–5.11)
Retic Count, Absolute: 91.2 10*3/uL (ref 19.0–186.0)
Retic Ct Pct: 2 % (ref 0.4–3.1)

## 2023-05-25 LAB — FERRITIN: Ferritin: 46 ng/mL (ref 11–307)

## 2023-05-25 MED ORDER — FOLIC ACID 1 MG PO TABS: 1.0000 mg | ORAL_TABLET | Freq: Every day | ORAL | 3 refills | Status: DC

## 2023-05-25 NOTE — Progress Notes (Deleted)
Hematology and Oncology Follow Up Visit  Sherry Villegas [id number] 1986/05/11 37 y.o. 05/25/2023   Principle Diagnosis:  Iron deficiency anemia  Alpha thalassemia minor trait   Current Therapy:  IV iron as indicated- Venofer 300 mg- last dose 01/13/2023 Folic acid 1 mg PO daily   Interim History:  Sherry Villegas is here today for follow-up.   Today she reports that she is *** Her cycle is now irregular and has occurred every 15 days for the past 2 months with heavy flow. No other blood loss noted.  No abnormal bruising, no petechiae.  She has had some dizziness at times. Thankfully she has not had any falls or syncope.  No fever, chills, n/v, cough, rash, SOB, chest pain, palpitations, abdominal pain or changes in bowel or bladder habits.  Numbness and tingling in her hands and feet comes and goes.  Appetite and hydration are good.    ECOG Performance Status: 1 - Symptomatic but completely ambulatory  Medications:  Allergies as of 05/25/2023       Reactions   Amoxil [amoxicillin] Swelling   Tongue was swollen   Dulera [mometasone Furo-formoterol Fum] Hives   Influenza Vaccines Itching        Medication List        Accurate as of May 25, 2023  2:01 PM. If you have any questions, ask your nurse or doctor.          acetaminophen 325 MG tablet Commonly known as: TYLENOL Take 650 mg by mouth every 6 (six) hours as needed.   albuterol 108 (90 Base) MCG/ACT inhaler Commonly known as: VENTOLIN HFA Inhale 2 puffs into the lungs every 6 (six) hours as needed for wheezing or shortness of breath.   budesonide-formoterol 80-4.5 MCG/ACT inhaler Commonly known as: SYMBICORT Inhale 2 puffs into the lungs in the morning and at bedtime.   chlorhexidine 4 % external liquid Commonly known as: HIBICLENS Apply topically daily as needed.   diclofenac 75 MG EC tablet Commonly known as: VOLTAREN Take 1 tablet (75 mg total) by mouth 2 (two) times daily with a meal.    dicyclomine 20 MG tablet Commonly known as: BENTYL Take 20 mg by mouth 3 (three) times daily before meals.   EPINEPHrine 0.3 mg/0.3 mL Soaj injection Commonly known as: EPI-PEN Inject 0.3 mg into the muscle as needed for anaphylaxis.   EXCEDRIN PO Take by mouth. Prn   famotidine 20 MG tablet Commonly known as: Pepcid Take 1 tablet (20 mg total) by mouth daily.   ferrous sulfate 324 (65 Fe) MG Tbec Take 1 tablet by mouth daily.   fexofenadine 60 MG tablet Commonly known as: ALLEGRA Take 60 mg by mouth 2 (two) times daily.   folic acid 1 MG tablet Commonly known as: FOLVITE Take 1 tablet (1 mg total) by mouth daily.   labetalol 100 MG tablet Commonly known as: NORMODYNE TAKE 1 TABLET BY MOUTH TWICE A DAY   loratadine 10 MG tablet Commonly known as: CLARITIN Take 10 mg by mouth daily.   omeprazole 40 MG capsule Commonly known as: PRILOSEC Take 1 capsule (40 mg total) by mouth 2 (two) times daily.   promethazine 25 MG tablet Commonly known as: PHENERGAN Take 0.5-1 tablets (12.5-25 mg total) by mouth every 6 (six) hours as needed for nausea or vomiting.   valACYclovir 1000 MG tablet Commonly known as: VALTREX Take 1 tablet (1,000 mg total) by mouth 2 (two) times daily.   Vitamin D (Ergocalciferol) 1.25 MG (50000 UNIT)  Caps capsule Commonly known as: DRISDOL Take 1 capsule (50,000 Units total) by mouth every 7 (seven) days.        Allergies:  Allergies  Allergen Reactions   Amoxil [Amoxicillin] Swelling    Tongue was swollen   Dulera [Mometasone Furo-Formoterol Fum] Hives   Influenza Vaccines Itching    Past Medical History, Surgical history, Social history, and Family History were reviewed and updated.  Review of Systems: All other 10 point review of systems is negative.   Physical Exam:  vitals were not taken for this visit.   Wt Readings from Last 3 Encounters:  04/07/23 260 lb 2.3 oz (118 kg)  03/10/23 262 lb 3.2 oz (118.9 kg)  02/15/23 262 lb  1.9 oz (118.9 kg)    Ocular: Sclerae unicteric, pupils equal, round and reactive to light Ear-nose-throat: Oropharynx clear, dentition fair Lymphatic: No cervical or supraclavicular adenopathy Lungs no rales or rhonchi, good excursion bilaterally Heart regular rate and rhythm, no murmur appreciated Abd soft, nontender, positive bowel sounds MSK no focal spinal tenderness, no joint edema Neuro: non-focal, well-oriented, appropriate affect  Lab Results  Component Value Date   WBC 6.6 03/10/2023   HGB 11.5 (L) 03/10/2023   HCT 34.9 (L) 03/10/2023   MCV 82.5 03/10/2023   PLT 327 03/10/2023   Lab Results  Component Value Date   FERRITIN 128 02/15/2023   IRON 51 02/15/2023   TIBC 325 02/15/2023   UIBC 274 02/15/2023   IRONPCTSAT 16 02/15/2023   Lab Results  Component Value Date   RETICCTPCT 1.8 02/15/2023   RBC 4.23 03/10/2023   No results found for: "KPAFRELGTCHN", "LAMBDASER", "KAPLAMBRATIO" No results found for: "IGGSERUM", "IGA", "IGMSERUM" No results found for: "TOTALPROTELP", "ALBUMINELP", "A1GS", "A2GS", "BETS", "BETA2SER", "GAMS", "MSPIKE", "SPEI"   Chemistry      Component Value Date/Time   NA 140 03/10/2023 1450   NA 138 03/10/2022 1640   K 3.8 03/10/2023 1450   CL 108 03/10/2023 1450   CO2 22 03/10/2023 1450   BUN 11 03/10/2023 1450   BUN 11 03/10/2022 1640   CREATININE 0.97 03/10/2023 1450      Component Value Date/Time   CALCIUM 9.4 03/10/2023 1450   ALKPHOS 68 12/22/2022 1624   AST 12 03/10/2023 1450   AST 21 09/27/2022 1441   ALT 11 03/10/2023 1450   ALT 11 09/27/2022 1441   BILITOT 0.3 03/10/2023 1450   BILITOT 0.3 09/27/2022 1441     No diagnosis found.  Impression and Plan: Sherry Villegas is a very pleasant 37 yo [place] American female with iron deficiency anemia and alpha thalassemia minor trait.   Today Hgb is  Iron studies are pending. Will replace if needed  Follow-up in 3 months.   Rushie Chestnut, PA-C 11/14/20242:01 PM

## 2023-05-25 NOTE — Progress Notes (Signed)
Hematology and Oncology Follow Up Visit  Sherry Villegas [id number] 28-Jul-1985 37 y.o. 05/25/2023   Principle Diagnosis:  Iron deficiency anemia  Alpha thalassemia minor trait   Current Therapy:  IV iron as indicated- Venofer 300 mg- last dose 01/13/2023 Folic acid 1 mg PO daily   Interim History:  Sherry Villegas is here today for follow-up.   Today she reports that she is doing ok. Has had some mild fatigue. Her cycle is now regular but is heavy and lasts 7 days. No other blood loss noted.  She is working with infertility specialists in hopes of becoming pregnant soon. She is on an GLP1  No abnormal bruising, no petechiae.  She has had some dizziness at times. Thankfully she has not had any falls or syncope.  No fever, chills, n/v, cough, rash, SOB, chest pain, palpitations, abdominal pain or changes in bowel or bladder habits.  Numbness and tingling in her hands and feet comes and goes.  Appetite and hydration are good.    ECOG Performance Status: 1 - Symptomatic but completely ambulatory  Medications:  Allergies as of 05/25/2023       Reactions   Amoxil [amoxicillin] Swelling   Tongue was swollen   Dulera [mometasone Furo-formoterol Fum] Hives   Influenza Vaccines Itching        Medication List        Accurate as of May 25, 2023  2:42 PM. If you have any questions, ask your nurse or doctor.          STOP taking these medications    ferrous sulfate 324 (65 Fe) MG Tbec Stopped by: Rushie Chestnut       TAKE these medications    acetaminophen 325 MG tablet Commonly known as: TYLENOL Take 650 mg by mouth every 6 (six) hours as needed.   albuterol 108 (90 Base) MCG/ACT inhaler Commonly known as: VENTOLIN HFA Inhale 2 puffs into the lungs every 6 (six) hours as needed for wheezing or shortness of breath.   budesonide-formoterol 80-4.5 MCG/ACT inhaler Commonly known as: SYMBICORT Inhale 2 puffs into the lungs in the morning and at bedtime.    chlorhexidine 4 % external liquid Commonly known as: HIBICLENS Apply topically daily as needed.   diclofenac 75 MG EC tablet Commonly known as: VOLTAREN Take 1 tablet (75 mg total) by mouth 2 (two) times daily with a meal.   dicyclomine 20 MG tablet Commonly known as: BENTYL Take 20 mg by mouth 3 (three) times daily before meals.   EPINEPHrine 0.3 mg/0.3 mL Soaj injection Commonly known as: EPI-PEN Inject 0.3 mg into the muscle as needed for anaphylaxis.   EXCEDRIN PO Take by mouth. Prn   famotidine 20 MG tablet Commonly known as: Pepcid Take 1 tablet (20 mg total) by mouth daily.   fexofenadine 60 MG tablet Commonly known as: ALLEGRA Take 60 mg by mouth 2 (two) times daily.   folic acid 1 MG tablet Commonly known as: FOLVITE Take 1 tablet (1 mg total) by mouth daily.   labetalol 100 MG tablet Commonly known as: NORMODYNE TAKE 1 TABLET BY MOUTH TWICE A DAY   loratadine 10 MG tablet Commonly known as: CLARITIN Take 10 mg by mouth daily.   omeprazole 40 MG capsule Commonly known as: PRILOSEC Take 1 capsule (40 mg total) by mouth 2 (two) times daily.   OZEMPIC (1 MG/DOSE) [place] Inject into the skin. Unsure dose   promethazine 25 MG tablet Commonly known as: PHENERGAN Take 0.5-1 tablets (12.5-25  mg total) by mouth every 6 (six) hours as needed for nausea or vomiting.   valACYclovir 1000 MG tablet Commonly known as: VALTREX Take 1 tablet (1,000 mg total) by mouth 2 (two) times daily.   Vitamin D (Ergocalciferol) 1.25 MG (50000 UNIT) Caps capsule Commonly known as: DRISDOL Take 1 capsule (50,000 Units total) by mouth every 7 (seven) days.        Allergies:  Allergies  Allergen Reactions   Amoxil [Amoxicillin] Swelling    Tongue was swollen   Dulera [Mometasone Furo-Formoterol Fum] Hives   Influenza Vaccines Itching    Past Medical History, Surgical history, Social history, and Family History were reviewed and updated.  Review of Systems: All other 10  point review of systems is negative.   Physical Exam:  weight is 255 lb (115.7 kg). Her oral temperature is 98.4 F (36.9 C). Her blood pressure is 127/76 and her pulse is 94. Her respiration is 18 and oxygen saturation is 100%.   Wt Readings from Last 3 Encounters:  05/25/23 255 lb (115.7 kg)  04/07/23 260 lb 2.3 oz (118 kg)  03/10/23 262 lb 3.2 oz (118.9 kg)    Ocular: Sclerae unicteric, pupils equal, round and reactive to light Ear-nose-throat: Oropharynx clear, dentition fair Lymphatic: No cervical or supraclavicular adenopathy Lungs no rales or rhonchi, good excursion bilaterally Heart regular rate and rhythm, no murmur appreciated Abd soft, nontender, positive bowel sounds MSK no focal spinal tenderness, no joint edema Neuro: non-focal, well-oriented, appropriate affect  Lab Results  Component Value Date   WBC 6.1 05/25/2023   HGB 12.5 05/25/2023   HCT 39.8 05/25/2023   MCV 85.6 05/25/2023   PLT 335 05/25/2023   Lab Results  Component Value Date   FERRITIN 128 02/15/2023   IRON 51 02/15/2023   TIBC 325 02/15/2023   UIBC 274 02/15/2023   IRONPCTSAT 16 02/15/2023   Lab Results  Component Value Date   RETICCTPCT 2.0 05/25/2023   RBC 4.63 05/25/2023   No results found for: "KPAFRELGTCHN", "LAMBDASER", "KAPLAMBRATIO" No results found for: "IGGSERUM", "IGA", "IGMSERUM" No results found for: "TOTALPROTELP", "ALBUMINELP", "A1GS", "A2GS", "BETS", "BETA2SER", "GAMS", "MSPIKE", "SPEI"   Chemistry      Component Value Date/Time   NA 140 03/10/2023 1450   NA 138 03/10/2022 1640   K 3.8 03/10/2023 1450   CL 108 03/10/2023 1450   CO2 22 03/10/2023 1450   BUN 11 03/10/2023 1450   BUN 11 03/10/2022 1640   CREATININE 0.97 03/10/2023 1450      Component Value Date/Time   CALCIUM 9.4 03/10/2023 1450   ALKPHOS 68 12/22/2022 1624   AST 12 03/10/2023 1450   AST 21 09/27/2022 1441   ALT 11 03/10/2023 1450   ALT 11 09/27/2022 1441   BILITOT 0.3 03/10/2023 1450   BILITOT  0.3 09/27/2022 1441     Encounter Diagnoses  Name Primary?   Iron deficiency anemia due to chronic blood loss Yes   Menorrhagia with regular cycle    Alpha thalassaemia minor     Impression and Plan: Sherry Villegas is a very pleasant 37 yo African American female with iron deficiency anemia and alpha thalassemia minor trait.   Today Hgb is stable at 12.5 Iron studies are pending. Will replace if needed  RTC 3 months APP, labs (CBC,iron, ferritin)  Rushie Chestnut, PA-C 11/14/20242:42 PM

## 2023-05-26 ENCOUNTER — Other Ambulatory Visit: Payer: Self-pay | Admitting: Family Medicine

## 2023-05-26 ENCOUNTER — Telehealth: Payer: Self-pay | Admitting: Family Medicine

## 2023-05-26 LAB — IRON AND IRON BINDING CAPACITY (CC-WL,HP ONLY)
Iron: 37 ug/dL (ref 28–170)
Saturation Ratios: 9 % — ABNORMAL LOW (ref 10.4–31.8)
TIBC: 395 ug/dL (ref 250–450)
UIBC: 358 ug/dL (ref 148–442)

## 2023-05-26 MED ORDER — VITAMIN D (ERGOCALCIFEROL) 1.25 MG (50000 UNIT) PO CAPS: 50000.0000 [IU] | ORAL_CAPSULE | ORAL | 1 refills | Status: DC

## 2023-05-26 NOTE — Telephone Encounter (Signed)
Pt called to advise that PA from Oncology sent message to Dr. Zola Button asking her to refill pt's vit d. Please follow up with PCP to see if this medication can be refilled. Please let pt knw.

## 2023-05-26 NOTE — Telephone Encounter (Signed)
Please send to [facility] on [address]

## 2023-05-26 NOTE — Telephone Encounter (Signed)
Please advise. Rx not refilled since 2023

## 2023-05-29 ENCOUNTER — Other Ambulatory Visit: Payer: Self-pay | Admitting: Medical Oncology

## 2023-05-29 NOTE — Progress Notes (Signed)
ANNUAL EXAM Patient name: Sherry Villegas MRN [id number]  Date of birth: 07/21/1985 Chief Complaint:   Annual Exam  History of Present Illness:   Sherry Villegas is a 37 y.o.  G0P0000  female  being seen today for a routine annual exam.  Current complaints: Menses regular. No concerns. Trying to lose weight because she wants to get pregnant. Working with [employer].  No LMP recorded.    Last pap 05/2022. Results were: NILM w/ HRHPV negative. H/O abnormal pap: no Last mammogram: N/a     04/05/2022    6:07 PM 03/08/2022    1:43 PM 03/16/2021    9:58 AM 12/14/2020    4:02 PM 06/30/2020    1:50 PM  Depression screen PHQ 2/9  Decreased Interest 0 0 1 0 0  Down, Depressed, Hopeless 0 0 1 0 0  PHQ - 2 Score 0 0 2 0 0  Altered sleeping   0    Tired, decreased energy   3    Change in appetite   1    Feeling bad or failure about yourself    0    Trouble concentrating   0    Moving slowly or fidgety/restless   1    Suicidal thoughts   0    PHQ-9 Score   7    Difficult doing work/chores   Not difficult at all          06/30/2020    2:45 PM  GAD 7 : Generalized Anxiety Score  Nervous, Anxious, on Edge 3  Control/stop worrying 3  Worry too much - different things 3  Trouble relaxing 3  Restless 0  Easily annoyed or irritable 2  Afraid - awful might happen 3  Total GAD 7 Score 17  Anxiety Difficulty Very difficult     Review of Systems:   Pertinent items are noted in HPI Denies any headaches, blurred vision, fatigue, shortness of breath, chest pain, abdominal pain, abnormal vaginal discharge/itching/odor/irritation, problems with periods, bowel movements, urination, or intercourse unless otherwise stated above. Pertinent History Reviewed:  Reviewed past medical,surgical, social and family history.  Reviewed problem list, medications and allergies. Physical Assessment:   Vitals:   05/25/23 1500  BP: 116/69  Pulse: 81  Weight: 253 lb (114.8 kg)  Height: 5\' 4"  (1.626 m)  Body mass  index is 43.43 kg/m.        Physical Examination:   General appearance - well appearing, and in no distress  Mental status - alert, oriented to person, place, and time  Psych:  She has a normal mood and affect  Skin - warm and dry, normal color, no suspicious lesions noted  Chest - effort normal, all lung fields clear to auscultation bilaterally  Heart - normal rate and regular rhythm  Neck:  midline trachea, no thyromegaly or nodules  Breasts - breasts appear normal, no suspicious masses, no skin or nipple changes or axillary nodes  Abdomen - soft, nontender, nondistended, no masses or organomegaly  Pelvic - VULVA: normal appearing vulva with no masses, tenderness or lesions  VAGINA: normal appearing vagina with normal color and discharge, no lesions  CERVIX: normal appearing cervix without discharge or lesions, no CMT  Thin prep pap is done with HR HPV cotesting  UTERUS: uterus is felt to be normal size, shape, consistency and nontender   ADNEXA: No adnexal masses or tenderness noted.  Extremities:  No swelling or varicosities noted  Chaperone present for exam  Assessment & Plan:  1. Well woman exam with routine gynecological exam Patient would like yearly PAP. - Cytology - PAP   Labs/procedures today:   No orders of the defined types were placed in this encounter.   Meds: No orders of the defined types were placed in this encounter.   Follow-up: No follow-ups on file.  [name], DO 05/29/2023 2:10 PM

## 2023-05-31 LAB — CYTOLOGY - PAP
Chlamydia: NEGATIVE
Comment: NEGATIVE
Comment: NEGATIVE
Comment: NEGATIVE
Comment: NORMAL
Diagnosis: NEGATIVE
High risk HPV: NEGATIVE
Neisseria Gonorrhea: NEGATIVE
Trichomonas: NEGATIVE

## 2023-06-01 ENCOUNTER — Inpatient Hospital Stay: Payer: BC Managed Care – PPO

## 2023-06-06 ENCOUNTER — Inpatient Hospital Stay: Payer: BC Managed Care – PPO

## 2023-06-06 VITALS — BP 128/83 | HR 80 | Temp 97.8°F | Resp 17

## 2023-06-06 DIAGNOSIS — N920 Excessive and frequent menstruation with regular cycle: Secondary | ICD-10-CM

## 2023-06-06 DIAGNOSIS — D563 Thalassemia minor: Secondary | ICD-10-CM | POA: Diagnosis not present

## 2023-06-06 DIAGNOSIS — D500 Iron deficiency anemia secondary to blood loss (chronic): Secondary | ICD-10-CM

## 2023-06-06 DIAGNOSIS — D509 Iron deficiency anemia, unspecified: Secondary | ICD-10-CM | POA: Diagnosis not present

## 2023-06-06 MED ORDER — SODIUM CHLORIDE 0.9 % IV SOLN
Freq: Once | INTRAVENOUS | Status: AC
Start: 2023-06-06 — End: 2023-06-06

## 2023-06-06 MED ORDER — SODIUM CHLORIDE 0.9 % IV SOLN
300.0000 mg | Freq: Once | INTRAVENOUS | Status: AC
  Administered 2023-06-06: 300 mg via INTRAVENOUS
  Filled 2023-06-06: qty 300

## 2023-06-06 NOTE — Patient Instructions (Signed)
Iron Sucrose Injection What is this medication? IRON SUCROSE (EYE ern SOO krose) treats low levels of iron (iron deficiency anemia) in people with kidney disease. Iron is a mineral that plays an important role in making red blood cells, which carry oxygen from your lungs to the rest of your body. This medicine may be used for other purposes; ask your health care provider or pharmacist if you have questions. COMMON BRAND NAME(S): Venofer What should I tell my care team before I take this medication? They need to know if you have any of these conditions: Anemia not caused by low iron levels Heart disease High levels of iron in the blood Kidney disease Liver disease An unusual or allergic reaction to iron, other medications, foods, dyes, or preservatives Pregnant or trying to get pregnant Breastfeeding How should I use this medication? This medication is for infusion into a vein. It is given in a hospital or clinic setting. Talk to your care team about the use of this medication in children. While this medication may be prescribed for children as young as 2 years for selected conditions, precautions do apply. Overdosage: If you think you have taken too much of this medicine contact a poison control center or emergency room at once. NOTE: This medicine is only for you. Do not share this medicine with others. What if I miss a dose? Keep appointments for follow-up doses. It is important not to miss your dose. Call your care team if you are unable to keep an appointment. What may interact with this medication? Do not take this medication with any of the following: Deferoxamine Dimercaprol Other iron products This medication may also interact with the following: Chloramphenicol Deferasirox This list may not describe all possible interactions. Give your health care provider a list of all the medicines, herbs, non-prescription drugs, or dietary supplements you use. Also tell them if you smoke,  drink alcohol, or use illegal drugs. Some items may interact with your medicine. What should I watch for while using this medication? Visit your care team regularly. Tell your care team if your symptoms do not start to get better or if they get worse. You may need blood work done while you are taking this medication. You may need to follow a special diet. Talk to your care team. Foods that contain iron include: whole grains/cereals, dried fruits, beans, or peas, leafy green vegetables, and organ meats (liver, kidney). What side effects may I notice from receiving this medication? Side effects that you should report to your care team as soon as possible: Allergic reactions--skin rash, itching, hives, swelling of the face, lips, tongue, or throat Low blood pressure--dizziness, feeling faint or lightheaded, blurry vision Shortness of breath Side effects that usually do not require medical attention (report to your care team if they continue or are bothersome): Flushing Headache Joint pain Muscle pain Nausea Pain, redness, or irritation at injection site This list may not describe all possible side effects. Call your doctor for medical advice about side effects. You may report side effects to FDA at 1-800-FDA-1088. Where should I keep my medication? This medication is given in a hospital or clinic. It will not be stored at home. NOTE: This sheet is a summary. It may not cover all possible information. If you have questions about this medicine, talk to your doctor, pharmacist, or health care provider.  2024 Elsevier/Gold Standard (2022-12-02 00:00:00)

## 2023-06-06 NOTE — Progress Notes (Signed)
Pt declined to stay for post infusion observation period. Pt stated she has tolerated medication multiple times prior without difficulty. Pt aware to call clinic with any questions or concerns. Pt verbalized understanding and had no further questions.  ? ?

## 2023-06-11 ENCOUNTER — Other Ambulatory Visit: Payer: Self-pay | Admitting: Family Medicine

## 2023-06-11 DIAGNOSIS — I10 Essential (primary) hypertension: Secondary | ICD-10-CM

## 2023-06-18 ENCOUNTER — Other Ambulatory Visit: Payer: Self-pay | Admitting: Family Medicine

## 2023-07-13 ENCOUNTER — Other Ambulatory Visit: Payer: Self-pay | Admitting: Family Medicine

## 2023-07-14 ENCOUNTER — Other Ambulatory Visit: Payer: Self-pay | Admitting: Family Medicine

## 2023-07-14 MED ORDER — VITAMIN D (ERGOCALCIFEROL) 1.25 MG (50000 UNIT) PO CAPS: 50000.0000 [IU] | ORAL_CAPSULE | ORAL | 1 refills | Status: DC

## 2023-08-15 ENCOUNTER — Encounter: Payer: Self-pay | Admitting: Family Medicine

## 2023-08-15 ENCOUNTER — Telehealth: Payer: BC Managed Care – PPO | Admitting: Family Medicine

## 2023-08-15 DIAGNOSIS — R300 Dysuria: Secondary | ICD-10-CM

## 2023-08-15 MED ORDER — NITROFURANTOIN MONOHYD MACRO 100 MG PO CAPS: 100.0000 mg | ORAL_CAPSULE | Freq: Two times a day (BID) | ORAL | 0 refills | Status: DC

## 2023-08-15 NOTE — Progress Notes (Signed)
MyChart Video Visit    Virtual Visit via Video Note   This patient is at least at moderate risk for complications without adequate follow up. This format is felt to be most appropriate for this patient at this time. Physical exam was limited by quality of the video and audio technology used for the visit. Herbert Seta  was able to get the patient set up on a video visit.  Patient location: home  Patient and provider in visit Provider location: Office  I discussed the limitations of evaluation and management by telemedicine and the availability of in person appointments. The patient expressed understanding and agreed to proceed.  Visit Date: 08/15/2023  Today's healthcare provider: Donato Schultz, DO     Subjective:    Patient ID: Sherry Villegas, female    DOB: [birth year], 38 y.o.   MRN: [id number]  No chief complaint on file.   HPI Patient is in today for uti symptoms. Discussed the use of AI scribe software for clinical note transcription with the patient, who gave verbal consent to proceed.  History of Present Illness   Sherry Villegas is a 38 year old female who presents with symptoms suggestive of a urinary tract infection (UTI).  She has been experiencing symptoms suggestive of a urinary tract infection since Sunday night, including a slight sensation during urination, which she identifies as a typical initial symptom of a UTI. There is no pain, and the frequency of urination has decreased. She has been using an over-the-counter medication, Azel, purchased from [facility] on Monday, which has been helping alleviate her symptoms. Today marks the second day of use, as the medication instructions advise against taking it for more than two days. No fever, burning sensation, or significant back pain. She experienced mild back pain for about fifteen minutes the previous day, which resolved without the need for analgesics.  She has had difficulty sleeping over the past few days,  attributing it to high anxiety levels due to her job situation. 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 This has led to increased stress and anxiety, affecting her sleep pattern. She describes her body as 'completely drained' but her mind as unable to rest, resulting in her staying awake until the early afternoon despite finishing work in the morning.       Past Medical History:  Diagnosis Date   Anemia    Anxiety    Asthma    Back pain    Constipation    GERD (gastroesophageal reflux disease)    Hyperlipidemia    Hypertension    Migraine    Stomach ulcer    Vaginal Pap smear, abnormal     No past surgical history on file.  Family History  Problem Relation Age of Onset   Heart failure Mother    Hypertension Mother    Heart disease Mother 26       MI   Stroke Mother    Sudden death Mother    Cancer Father    Heart failure Father    Stroke Father    Hypertension Father    Diabetes Father    Heart attack Father    Dementia Father    Hypertension Brother    Diabetes Brother    Breast cancer Maternal Grandmother    Breast cancer Paternal Grandmother    Breast cancer Maternal Aunt     Social History   Socioeconomic History  Marital status: Single    Spouse name: Not on file   Number of children: Not on file   Years of education: Not on file   Highest education level: Associate degree: occupational, Scientist, product/process development, or vocational program  Occupational History   Occupation: customer service rep    Occupation: Customer service rep  Tobacco Use   Smoking status: Every Day    Current packs/day: 0.25    Average packs/day: 0.3 packs/day for 15.0 years (3.8 ttl pk-yrs)    Types: Cigarettes   Smokeless tobacco: Never  Vaping Use   Vaping status: Never Used  Substance and Sexual Activity   Alcohol use: Yes    Alcohol/week: 6.0 standard drinks of alcohol    Types: 6 Shots of liquor per week   Drug use: Yes     Types: Marijuana    Comment: special occassions only-- 1 x q6 months   Sexual activity: Yes    Partners: Male  Other Topics Concern   Not on file  Social History Narrative   Exercise--- no due to asthma   Social Drivers of Corporate investment banker Strain: Not on file  Food Insecurity: No Food Insecurity (09/27/2022)   Hunger Vital Sign    Worried About Running Out of Food in the Last Year: Never true    Ran Out of Food in the Last Year: Never true  Transportation Needs: No Transportation Needs (09/27/2022)   PRAPARE - Administrator, Civil Service (Medical): No    Lack of Transportation (Non-Medical): No  Physical Activity: Not on file  Stress: Not on file  Social Connections: Not on file  Intimate Partner Violence: Not At Risk (09/27/2022)   Humiliation, Afraid, Rape, and Kick questionnaire    Fear of Current or Ex-Partner: No    Emotionally Abused: No    Physically Abused: No    Sexually Abused: No    Outpatient Medications Prior to Visit  Medication Sig Dispense Refill   acetaminophen (TYLENOL) 325 MG tablet Take 650 mg by mouth every 6 (six) hours as needed.     albuterol (VENTOLIN HFA) 108 (90 Base) MCG/ACT inhaler Inhale 2 puffs into the lungs every 6 (six) hours as needed for wheezing or shortness of breath. 1 g 2   Aspirin-Acetaminophen-Caffeine (EXCEDRIN PO) Take by mouth. Prn     budesonide-formoterol (SYMBICORT) 80-4.5 MCG/ACT inhaler Inhale 2 puffs into the lungs in the morning and at bedtime. 1 each 5   chlorhexidine (HIBICLENS) 4 % external liquid Apply topically daily as needed. 473 mL 5   diclofenac (VOLTAREN) 75 MG EC tablet TAKE 1 TABLET BY MOUTH TWICE A DAY WITH A MEAL 30 tablet 1   EPINEPHrine 0.3 mg/0.3 mL IJ SOAJ injection Inject 0.3 mg into the muscle as needed for anaphylaxis. 1 each 1   famotidine (PEPCID) 20 MG tablet Take 1 tablet (20 mg total) by mouth daily. 180 tablet 3   fexofenadine (ALLEGRA) 60 MG tablet Take 60 mg by mouth 2  (two) times daily.     folic acid (FOLVITE) 1 MG tablet Take 1 tablet (1 mg total) by mouth daily. 90 tablet 3   labetalol (NORMODYNE) 100 MG tablet TAKE 1 TABLET BY MOUTH TWICE A DAY 180 tablet 1   loratadine (CLARITIN) 10 MG tablet Take 10 mg by mouth daily.     omeprazole (PRILOSEC) 40 MG capsule TAKE 1 CAPSULE BY MOUTH TWICE A DAY 180 capsule 1   promethazine (PHENERGAN) 25 MG tablet Take 0.5-1  tablets (12.5-25 mg total) by mouth every 6 (six) hours as needed for nausea or vomiting. 30 tablet 2   Semaglutide (OZEMPIC, 1 MG/DOSE, [place]) Inject into the skin. Unsure dose     valACYclovir (VALTREX) 1000 MG tablet Take 1 tablet (1,000 mg total) by mouth 2 (two) times daily. 60 tablet 2   Vitamin D, Ergocalciferol, (DRISDOL) 1.25 MG (50000 UNIT) CAPS capsule Take 1 capsule (50,000 Units total) by mouth every 7 (seven) days. 12 capsule 1   dicyclomine (BENTYL) 20 MG tablet Take 20 mg by mouth 3 (three) times daily before meals. (Patient not taking: Reported on 08/15/2023)     Facility-Administered Medications Prior to Visit  Medication Dose Route Frequency Provider Last Rate Last Admin   omalizumab Geoffry Paradise) injection 300 mg  300 mg Subcutaneous Q28 days Verlee Monte, MD   300 mg at 02/23/23 1613    Allergies  Allergen Reactions   Amoxil [Amoxicillin] Swelling    Tongue was swollen   Dulera [Mometasone Furo-Formoterol Fum] Hives   Influenza Vaccines Itching    Review of Systems  Constitutional:  Negative for fever and malaise/fatigue.  HENT:  Negative for congestion.   Eyes:  Negative for blurred vision.  Respiratory:  Negative for cough and shortness of breath.   Cardiovascular:  Negative for chest pain, palpitations and leg swelling.  Gastrointestinal:  Negative for abdominal pain, blood in stool, nausea and vomiting.  Genitourinary:  Positive for dysuria and frequency.  Musculoskeletal:  Negative for back pain and falls.  Skin:  Negative for rash.  Neurological:  Negative for  dizziness, loss of consciousness and headaches.  Endo/Heme/Allergies:  Negative for environmental allergies.  Psychiatric/Behavioral:  Negative for depression. The patient is not nervous/anxious.        Objective:    Physical Exam Vitals and nursing note reviewed.  Psychiatric:        Mood and Affect: Mood normal.        Behavior: Behavior normal.        Thought Content: Thought content normal.        Judgment: Judgment normal.     There were no vitals taken for this visit. Wt Readings from Last 3 Encounters:  05/25/23 253 lb (114.8 kg)  05/25/23 255 lb (115.7 kg)  04/07/23 260 lb 2.3 oz (118 kg)       Assessment & Plan:  Dysuria -     Nitrofurantoin Monohyd Macro; Take 1 capsule (100 mg total) by mouth 2 (two) times daily.  Dispense: 14 capsule; Refill: 0   Assessment and Plan    Urinary Tract Infection (UTI) Symptoms began on Sunday night. There is improvement with OTC Azel, but there is concern about the need for antibiotics. No fever, significant pain, or burning sensation is present. Mild back pain lasted for 15 minutes, and a mild sensation during urination persists. Antibiotics are prescribed based on symptom persistence and potential bacterial infection. The benefits of Macrobid and the need to monitor symptoms were discussed. Prescribe Macrobid for 7 days. Advise calling if symptoms do not improve or worsen. Follow up if symptoms persist for urine testing.  Anxiety and Insomnia High anxiety and insomnia are due to job loss and financial stress, leading to significant fatigue. Actively seeking new employment. 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 Advised practicing relaxation techniques and considering anxiety management with a primary care provider. Explore job opportunities in hospital systems, particularly call centers. Practice relaxation techniques. Discuss anxiety management with a primary  care provider.        I  discussed the assessment and treatment plan with the patient. The patient was provided an opportunity to ask questions and all were answered. The patient agreed with the plan and demonstrated an understanding of the instructions.   The patient was advised to call back or seek an in-person evaluation if the symptoms worsen or if the condition fails to improve as anticipated.  Donato Schultz, DO [facility] [phone] (phone) [phone] (fax)  [facility]

## 2023-08-24 ENCOUNTER — Ambulatory Visit: Payer: BC Managed Care – PPO | Admitting: Medical Oncology

## 2023-08-24 ENCOUNTER — Inpatient Hospital Stay: Payer: BC Managed Care – PPO

## 2023-08-24 ENCOUNTER — Inpatient Hospital Stay: Payer: BC Managed Care – PPO | Attending: Hematology & Oncology

## 2023-08-24 ENCOUNTER — Inpatient Hospital Stay: Payer: BC Managed Care – PPO | Admitting: Medical Oncology

## 2023-09-07 ENCOUNTER — Other Ambulatory Visit: Payer: Self-pay | Admitting: Family Medicine

## 2023-09-07 DIAGNOSIS — I10 Essential (primary) hypertension: Secondary | ICD-10-CM

## 2023-09-26 DIAGNOSIS — N971 Female infertility of tubal origin: Secondary | ICD-10-CM | POA: Diagnosis not present

## 2023-10-06 DIAGNOSIS — Z319 Encounter for procreative management, unspecified: Secondary | ICD-10-CM | POA: Diagnosis not present

## 2023-10-12 DIAGNOSIS — N971 Female infertility of tubal origin: Secondary | ICD-10-CM | POA: Diagnosis not present

## 2024-01-09 ENCOUNTER — Encounter: Payer: Self-pay | Admitting: Family Medicine

## 2024-01-09 ENCOUNTER — Encounter: Payer: Self-pay | Admitting: Family

## 2024-01-09 ENCOUNTER — Ambulatory Visit (INDEPENDENT_AMBULATORY_CARE_PROVIDER_SITE_OTHER): Admitting: Family Medicine

## 2024-01-09 VITALS — BP 142/86 | HR 87 | Temp 98.0°F | Resp 18 | Ht 64.0 in | Wt 223.0 lb

## 2024-01-09 DIAGNOSIS — I10 Essential (primary) hypertension: Secondary | ICD-10-CM | POA: Diagnosis not present

## 2024-01-09 DIAGNOSIS — N644 Mastodynia: Secondary | ICD-10-CM

## 2024-01-09 DIAGNOSIS — Z6838 Body mass index (BMI) 38.0-38.9, adult: Secondary | ICD-10-CM

## 2024-01-09 DIAGNOSIS — E538 Deficiency of other specified B group vitamins: Secondary | ICD-10-CM

## 2024-01-09 DIAGNOSIS — N809 Endometriosis, unspecified: Secondary | ICD-10-CM

## 2024-01-09 DIAGNOSIS — D649 Anemia, unspecified: Secondary | ICD-10-CM

## 2024-01-09 DIAGNOSIS — K219 Gastro-esophageal reflux disease without esophagitis: Secondary | ICD-10-CM

## 2024-01-09 DIAGNOSIS — Z0000 Encounter for general adult medical examination without abnormal findings: Secondary | ICD-10-CM | POA: Diagnosis not present

## 2024-01-09 MED ORDER — TIRZEPATIDE-WEIGHT MANAGEMENT 10 MG/0.5ML ~~LOC~~ SOLN
10.0000 mg | SUBCUTANEOUS | 0 refills | Status: DC
Start: 2024-01-09 — End: 2024-01-10

## 2024-01-09 NOTE — Patient Instructions (Signed)

## 2024-01-09 NOTE — Progress Notes (Signed)
 Established Patient Office Visit  Subjective   Patient ID: Sherry Villegas, female    DOB: May 25, 1986  Age: 38 y.o. MRN: [id number]  Chief Complaint  Patient presents with   Breast Pain    Both onset: 2-3 weeks     HPI Discussed the use of AI scribe software for clinical note transcription with the patient, who gave verbal consent to proceed.  History of Present Illness Sherry Villegas is a 38 year old female who presents  for cpe and with a burning sensation in her breasts.  She experiences a burning sensation in her breasts lasting about three to four minutes. The sensation is not constant, and she does not feel any lumps or masses in her breasts. She is particularly concerned due to her family history of breast cancer, specifically her mother's history.  She has a history of blocked fallopian tubes and suspects worsening endometriosis symptoms each month, although she has not been officially diagnosed. She finds the financial aspect of considering IVF challenging.  She is currently on a weight loss medication regimen, having switched from semaglutide to tirzepatide two months ago. She reports weight loss since starting the medication but also notes hair loss, which she attributes to potential protein deficiency. She is on her second dose of tirzepatide, mixed with supplements for hair, nails, and skin.  She takes omeprazole  twice daily for reflux, which she finds effective. She expresses concern about potential long-term effects of omeprazole , particularly in relation to dementia, due to her father's history of dementia or Alzheimer's disease.  She mentions a recent increase in anxiety and difficulty sleeping, which she attributes to her current life stressors, including job changes and financial concerns. She has not been exercising regularly for the past three weeks but was previously stretching three to four days a week.  Her social history includes smoking black and mild cigars, and  she has reduced her alcohol consumption since starting her current medication. 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   Patient Active Problem List   Diagnosis Date Noted   Nausea and vomiting 09/09/2022   Other fatigue 09/09/2022   Anemia 09/09/2022   B12 deficiency 09/09/2022   Abnormal thyroid  blood test 04/05/2022   Rash and other nonspecific skin eruption 09/29/2021   Other allergic rhinitis 09/29/2021   Moderate persistent asthma without complication 09/29/2021   Pelvic pain 05/11/2021   Cough 12/14/2020   Tinea corporis 12/14/2020   Gastroesophageal reflux disease 12/14/2020   Urticaria 12/14/2020   Hemorrhoids 12/14/2020   Iron  deficiency anemia 07/02/2020   COVID-19 vaccination declined 06/30/2020   Ovulation pain 05/28/2020   Infertility, female 05/28/2020   Menorrhagia with regular cycle 05/28/2020   Dysmenorrhea 05/28/2020   Genital herpes 05/13/2019   Primary hypertension 02/27/2019   Morbid obesity (HCC) 02/06/2017   Migraines 09/10/2015   Asthmatic bronchitis , chronic (HCC) 06/22/2015   Past Medical History:  Diagnosis Date   Anemia    Anxiety    Asthma    Back pain    Constipation    GERD (gastroesophageal reflux disease)    Hyperlipidemia    Hypertension    Migraine    Stomach ulcer    Vaginal Pap smear, abnormal    History reviewed. No pertinent surgical history. Social History   Tobacco Use   Smoking status: Every Day    Current packs/day: 0.25    Average packs/day: 0.3 packs/day for 15.0 years (3.8 ttl pk-yrs)  Types: Cigarettes   Smokeless tobacco: Never  Vaping Use   Vaping status: Never Used  Substance Use Topics   Alcohol use: Yes    Alcohol/week: 6.0 standard drinks of alcohol    Types: 6 Shots of liquor per week   Drug use: Yes    Types: Marijuana    Comment: special occassions only-- 1 x q6 months   Social History   Socioeconomic History    Marital status: Single    Spouse name: Not on file   Number of children: Not on file   Years of education: Not on file   Highest education level: Associate degree: occupational, Scientist, product/process development, or vocational program  Occupational History   Occupation: customer service rep    Occupation: Clinical biochemist rep    Comment: [employer]  Tobacco Use   Smoking status: Every Day    Current packs/day: 0.25    Average packs/day: 0.3 packs/day for 15.0 years (3.8 ttl pk-yrs)    Types: Cigarettes   Smokeless tobacco: Never  Vaping Use   Vaping status: Never Used  Substance and Sexual Activity   Alcohol use: Yes    Alcohol/week: 6.0 standard drinks of alcohol    Types: 6 Shots of liquor per week   Drug use: Yes    Types: Marijuana    Comment: special occassions only-- 1 x q6 months   Sexual activity: Yes    Partners: Male  Other Topics Concern   Not on file  Social History Narrative   Exercise--- no due to asthma   Social Drivers of Corporate investment banker Strain: Not on file  Food Insecurity: No Food Insecurity (09/27/2022)   Hunger Vital Sign    Worried About Running Out of Food in the Last Year: Never true    Ran Out of Food in the Last Year: Never true  Transportation Needs: No Transportation Needs (09/27/2022)   PRAPARE - Administrator, Civil Service (Medical): No    Lack of Transportation (Non-Medical): No  Physical Activity: Not on file  Stress: Not on file  Social Connections: Not on file  Intimate Partner Violence: Not At Risk (09/27/2022)   Humiliation, Afraid, Rape, and Kick questionnaire    Fear of Current or Ex-Partner: No    Emotionally Abused: No    Physically Abused: No    Sexually Abused: No   Family Status  Relation Name Status   Mother  Deceased   Father  Deceased at age 37       MI   Brother  Alive   MGM  (Not Specified)   PGM  (Not Specified)   Youth worker  (Not Specified)  No partnership data on file   Family History  Problem Relation Age  of Onset   Heart failure Mother    Hypertension Mother    Heart disease Mother 63       MI   Stroke Mother    Sudden death Mother    Cancer Father    Heart failure Father    Stroke Father    Hypertension Father    Diabetes Father    Heart attack Father    Dementia Father    Hypertension Brother    Diabetes Brother    Breast cancer Maternal Grandmother    Breast cancer Paternal Grandmother    Breast cancer Maternal Aunt    Allergies  Allergen Reactions   Amoxil [Amoxicillin] Swelling    Tongue was swollen   Dulera [Mometasone  Furo-Formoterol   Fum] Hives   Influenza Vaccines Itching      Review of Systems  Constitutional:  Negative for chills, fever and malaise/fatigue.  HENT:  Negative for congestion and hearing loss.   Eyes:  Negative for blurred vision and discharge.  Respiratory:  Negative for cough, sputum production and shortness of breath.   Cardiovascular:  Negative for chest pain, palpitations and leg swelling.  Gastrointestinal:  Negative for abdominal pain, blood in stool, constipation, diarrhea, heartburn, nausea and vomiting.  Genitourinary:  Negative for dysuria, frequency, hematuria and urgency.  Musculoskeletal:  Negative for back pain, falls and myalgias.  Skin:  Negative for rash.  Neurological:  Negative for dizziness, sensory change, loss of consciousness, weakness and headaches.  Endo/Heme/Allergies:  Negative for environmental allergies. Does not bruise/bleed easily.  Psychiatric/Behavioral:  Negative for depression and suicidal ideas. The patient is not nervous/anxious and does not have insomnia.       Objective:     BP (!) 142/86   Pulse 87   Temp 98 F (36.7 C)   Resp 18   Ht 5' 4 (1.626 m)   Wt 223 lb (101.2 kg)   SpO2 100%   BMI 38.28 kg/m  BP Readings from Last 3 Encounters:  01/09/24 (!) 142/86  06/06/23 128/83  05/25/23 116/69   Wt Readings from Last 3 Encounters:  01/09/24 223 lb (101.2 kg)  05/25/23 253 lb (114.8 kg)   05/25/23 255 lb (115.7 kg)   SpO2 Readings from Last 3 Encounters:  01/09/24 100%  06/06/23 98%  05/25/23 100%      Physical Exam Vitals and nursing note reviewed.  Constitutional:      General: She is not in acute distress.    Appearance: Normal appearance. She is well-developed.  HENT:     Head: Normocephalic and atraumatic.     Right Ear: Tympanic membrane, ear canal and external ear normal. There is no impacted cerumen.     Left Ear: Tympanic membrane, ear canal and external ear normal. There is no impacted cerumen.     Nose: Nose normal.     Mouth/Throat:     Mouth: Mucous membranes are moist.     Pharynx: Oropharynx is clear. No oropharyngeal exudate or posterior oropharyngeal erythema.   Eyes:     General: No scleral icterus.       Right eye: No discharge.        Left eye: No discharge.     Conjunctiva/sclera: Conjunctivae normal.     Pupils: Pupils are equal, round, and reactive to light.   Neck:     Thyroid : No thyromegaly or thyroid  tenderness.     Vascular: No JVD.   Cardiovascular:     Rate and Rhythm: Normal rate and regular rhythm.     Heart sounds: Normal heart sounds. No murmur heard. Pulmonary:     Effort: Pulmonary effort is normal. No respiratory distress.     Breath sounds: Normal breath sounds.  Chest:     Chest wall: No mass, deformity, swelling or tenderness.  Breasts:    Right: Normal. No swelling, bleeding, inverted nipple, mass, nipple discharge, skin change or tenderness.     Left: Normal. No swelling, bleeding, inverted nipple, mass, nipple discharge, skin change or tenderness.  Abdominal:     General: Bowel sounds are normal. There is no distension.     Palpations: Abdomen is soft. There is no mass.     Tenderness: There is no abdominal tenderness. There is no guarding or rebound.  Genitourinary:    Vagina: Normal.   Musculoskeletal:        General: Normal range of motion.     Cervical back: Normal range of motion and neck supple.      Right lower leg: No edema.     Left lower leg: No edema.  Lymphadenopathy:     Cervical: No cervical adenopathy.     Upper Body:     Right upper body: No supraclavicular, axillary or pectoral adenopathy.     Left upper body: No supraclavicular, axillary or pectoral adenopathy.   Skin:    General: Skin is warm and dry.     Findings: No erythema or rash.   Neurological:     Mental Status: She is alert and oriented to person, place, and time.     Cranial Nerves: No cranial nerve deficit.     Deep Tendon Reflexes: Reflexes are normal and symmetric.   Psychiatric:        Mood and Affect: Mood normal.        Behavior: Behavior normal.        Thought Content: Thought content normal.        Judgment: Judgment normal.     No results found for any visits on 01/09/24.  Last CBC Lab Results  Component Value Date   WBC 6.1 05/25/2023   HGB 12.5 05/25/2023   HCT 39.8 05/25/2023   MCV 85.6 05/25/2023   MCH 26.9 05/25/2023   RDW 14.4 05/25/2023   PLT 335 05/25/2023   Last metabolic panel Lab Results  Component Value Date   GLUCOSE 75 03/10/2023   NA 140 03/10/2023   K 3.8 03/10/2023   CL 108 03/10/2023   CO2 22 03/10/2023   BUN 11 03/10/2023   CREATININE 0.97 03/10/2023   GFR 72.07 12/22/2022   CALCIUM 9.4 03/10/2023   PROT 6.9 03/10/2023   ALBUMIN 4.0 12/22/2022   LABGLOB 3.2 03/10/2022   AGRATIO 1.5 03/10/2022   BILITOT 0.3 03/10/2023   ALKPHOS 68 12/22/2022   AST 12 03/10/2023   ALT 11 03/10/2023   ANIONGAP 12 09/27/2022   Last lipids Lab Results  Component Value Date   CHOL 186 03/10/2023   HDL 51 03/10/2023   LDLCALC 109 (H) 03/10/2023   TRIG 144 03/10/2023   CHOLHDL 3.6 03/10/2023   Last hemoglobin A1c Lab Results  Component Value Date   HGBA1C 5.6 12/22/2022   Last thyroid  functions Lab Results  Component Value Date   TSH 1.46 03/10/2023   T3TOTAL 151 03/16/2021   Last vitamin D  Lab Results  Component Value Date   VD25OH 18 (L) 03/10/2023    Last vitamin B12 and Folate Lab Results  Component Value Date   VITAMINB12 496 03/10/2023   FOLATE 7.4 03/16/2021      The ASCVD Risk score (Arnett DK, et al., 2019) failed to calculate for the following reasons:   The 2019 ASCVD risk score is only valid for ages 51 to 70    Assessment & Plan:   Problem List Items Addressed This Visit       Unprioritized   Gastroesophageal reflux disease   Primary hypertension   Relevant Orders   CBC with Differential/Platelet   Comprehensive metabolic panel with GFR   Lipid panel   TSH   B12 deficiency   Relevant Orders   Vitamin B12   Anemia   Morbid obesity (HCC)   Relevant Medications   tirzepatide 10 MG/0.5ML injection vial   Other Relevant Orders  Lipid panel   TSH   Hemoglobin A1c   Insulin , random   VITAMIN D  25 Hydroxy (Vit-D Deficiency, Fractures)   Vitamin B12   Other Visit Diagnoses       Preventative health care    -  Primary     Breast pain       Relevant Orders   MM 3D DIAGNOSTIC MAMMOGRAM BILATERAL BREAST   US  BREAST COMPLETE UNI LEFT INC AXILLA   US  BREAST COMPLETE UNI RIGHT INC AXILLA     Endometriosis       Relevant Medications   tirzepatide 10 MG/0.5ML injection vial     Assessment and Plan Assessment & Plan Breast Pain   She experiences an intermittent burning sensation in the breasts, likely due to large breast size causing nerve irritation. Although there is a family history of breast cancer, the burning sensation is not typically associated with cancer. A diagnostic mammogram is recommended for further evaluation due to family history and concern. Order a diagnostic mammogram at the breast center in [place] and consider an ultrasound if needed based on mammogram results.  Endometriosis   Suspected endometriosis with worsening symptoms and tubal blockage noted. IVF is suggested prior to potential surgery to preserve fertility, as surgery may impact ovarian function. Concerns about IVF cost and  endometriosis progression were discussed. Discuss IVF and surgical options with a fertility specialist. Monitor symptoms and consider surgical intervention if symptoms worsen.  Gastroesophageal Reflux Disease (GERD)   Chronic GERD is managed with omeprazole  twice daily. There are concerns about potential long-term effects of omeprazole . Dietary modifications are advised, and Pepcid  is considered as an alternative. Long-term untreated GERD can lead to ulcers and stomach cancer. Continue omeprazole  twice daily, consider dietary modifications, and try Pepcid  as an alternative if feasible.  Obesity   She is on tirzepatide for weight loss, previously on semaglutide. Experiencing weight loss but concerned about cost and potential side effects such as hair loss. Advised to maintain protein intake to prevent hair loss. Discussed obtaining tirzepatide through [facility] for cost savings. Continue tirzepatide for weight loss, ensure adequate protein intake, and consider obtaining tirzepatide through [facility] for potential cost savings.  Hypertension   Blood pressure is elevated at 140/?SABRA She reports feeling nervous and not sleeping well, which may contribute to elevated readings. Current medication is safe for potential pregnancy. Monitor blood pressure at home a few times a week and ensure adequate sleep to help manage blood pressure.  Smoking Cessation   She continues to smoke Black & Mild cigars despite trying to conceive. Advised to quit smoking to improve fertility and overall health.  General Health Maintenance   She is due for a physical examination and lab work. Discussed the importance of regular health maintenance and screenings. Schedule a physical examination and lab work, and encourage regular exercise and healthy lifestyle choices.  Follow-up   Follow-up plans for breast pain, endometriosis, GERD, and weight management. Advised to contact the breast center for scheduling the mammogram. Follow up with  the breast center for mammogram results, consult with a fertility specialist regarding endometriosis and IVF options, and continue monitoring and managing GERD and weight loss.    Return if symptoms worsen or fail to improve.    Deliana Avalos R Lowne Chase, DO

## 2024-01-10 ENCOUNTER — Other Ambulatory Visit: Payer: Self-pay

## 2024-01-10 DIAGNOSIS — N809 Endometriosis, unspecified: Secondary | ICD-10-CM

## 2024-01-10 LAB — CBC WITH DIFFERENTIAL/PLATELET
Basophils Absolute: 0.1 10*3/uL (ref 0.0–0.1)
Basophils Relative: 1.1 % (ref 0.0–3.0)
Eosinophils Absolute: 0.1 10*3/uL (ref 0.0–0.7)
Eosinophils Relative: 1.8 % (ref 0.0–5.0)
HCT: 37 % (ref 36.0–46.0)
Hemoglobin: 11.8 g/dL — ABNORMAL LOW (ref 12.0–15.0)
Lymphocytes Relative: 30.6 % (ref 12.0–46.0)
Lymphs Abs: 1.9 10*3/uL (ref 0.7–4.0)
MCHC: 32 g/dL (ref 30.0–36.0)
MCV: 81.8 fl (ref 78.0–100.0)
Monocytes Absolute: 0.4 10*3/uL (ref 0.1–1.0)
Monocytes Relative: 6.6 % (ref 3.0–12.0)
Neutro Abs: 3.7 10*3/uL (ref 1.4–7.7)
Neutrophils Relative %: 59.9 % (ref 43.0–77.0)
Platelets: 388 10*3/uL (ref 150.0–400.0)
RBC: 4.52 Mil/uL (ref 3.87–5.11)
RDW: 14.9 % (ref 11.5–15.5)
WBC: 6.2 10*3/uL (ref 4.0–10.5)

## 2024-01-10 LAB — LIPID PANEL
Cholesterol: 216 mg/dL — ABNORMAL HIGH (ref 0–200)
HDL: 71.6 mg/dL (ref >39.00)
LDL Cholesterol: 118 mg/dL — ABNORMAL HIGH (ref 0–99)
NonHDL: 144.54
Total CHOL/HDL Ratio: 3
Triglycerides: 132 mg/dL (ref 0.0–149.0)
VLDL: 26.4 mg/dL (ref 0.0–40.0)

## 2024-01-10 LAB — COMPREHENSIVE METABOLIC PANEL WITH GFR
ALT: 9 U/L (ref 0–35)
AST: 14 U/L (ref 0–37)
Albumin: 4.6 g/dL (ref 3.5–5.2)
Alkaline Phosphatase: 74 U/L (ref 39–117)
BUN: 13 mg/dL (ref 6–23)
CO2: 27 meq/L (ref 19–32)
Calcium: 9.8 mg/dL (ref 8.4–10.5)
Chloride: 103 meq/L (ref 96–112)
Creatinine, Ser: 0.97 mg/dL (ref 0.40–1.20)
GFR: 75.1 mL/min (ref >60.00)
Glucose, Bld: 78 mg/dL (ref 70–99)
Potassium: 4 meq/L (ref 3.5–5.1)
Sodium: 138 meq/L (ref 135–145)
Total Bilirubin: 0.2 mg/dL (ref 0.2–1.2)
Total Protein: 8.1 g/dL (ref 6.0–8.3)

## 2024-01-10 LAB — VITAMIN D 25 HYDROXY (VIT D DEFICIENCY, FRACTURES): VITD: 35.25 ng/mL (ref 30.00–100.00)

## 2024-01-10 LAB — INSULIN, RANDOM: Insulin: 9.1 u[IU]/mL

## 2024-01-10 LAB — TSH: TSH: 1.29 u[IU]/mL (ref 0.35–5.50)

## 2024-01-10 LAB — HEMOGLOBIN A1C: Hgb A1c MFr Bld: 5.7 % (ref 4.6–6.5)

## 2024-01-10 LAB — VITAMIN B12: Vitamin B-12: 530 pg/mL (ref 211–911)

## 2024-01-10 MED ORDER — TIRZEPATIDE-WEIGHT MANAGEMENT 10 MG/0.5ML ~~LOC~~ SOLN
10.0000 mg | SUBCUTANEOUS | 0 refills | Status: DC
Start: 2024-01-10 — End: 2024-05-17

## 2024-01-15 ENCOUNTER — Ambulatory Visit: Payer: Self-pay | Admitting: Family Medicine

## 2024-01-16 ENCOUNTER — Encounter: Payer: Self-pay | Admitting: Family Medicine

## 2024-01-17 ENCOUNTER — Other Ambulatory Visit: Payer: Self-pay | Admitting: Family Medicine

## 2024-01-25 ENCOUNTER — Ambulatory Visit: Payer: Self-pay | Admitting: Medical Oncology

## 2024-01-25 ENCOUNTER — Ambulatory Visit
Admission: RE | Admit: 2024-01-25 | Discharge: 2024-01-25 | Disposition: A | Discharge status: Home / Self Care | Source: Ambulatory Visit | Attending: Family Medicine | Admitting: Family Medicine

## 2024-01-25 ENCOUNTER — Inpatient Hospital Stay: Attending: Hematology & Oncology

## 2024-01-25 ENCOUNTER — Ambulatory Visit

## 2024-01-25 ENCOUNTER — Other Ambulatory Visit: Payer: Self-pay | Admitting: Medical Oncology

## 2024-01-25 ENCOUNTER — Inpatient Hospital Stay (HOSPITAL_BASED_OUTPATIENT_CLINIC_OR_DEPARTMENT_OTHER): Admitting: Medical Oncology

## 2024-01-25 VITALS — BP 101/86 | HR 104 | Temp 98.4°F | Resp 17 | Ht 64.0 in | Wt 218.0 lb

## 2024-01-25 DIAGNOSIS — K219 Gastro-esophageal reflux disease without esophagitis: Secondary | ICD-10-CM | POA: Insufficient documentation

## 2024-01-25 DIAGNOSIS — D500 Iron deficiency anemia secondary to blood loss (chronic): Secondary | ICD-10-CM

## 2024-01-25 DIAGNOSIS — D563 Thalassemia minor: Secondary | ICD-10-CM | POA: Insufficient documentation

## 2024-01-25 DIAGNOSIS — N920 Excessive and frequent menstruation with regular cycle: Secondary | ICD-10-CM

## 2024-01-25 DIAGNOSIS — D509 Iron deficiency anemia, unspecified: Secondary | ICD-10-CM | POA: Diagnosis present

## 2024-01-25 DIAGNOSIS — N644 Mastodynia: Secondary | ICD-10-CM

## 2024-01-25 LAB — CBC
HCT: 39.6 % (ref 36.0–46.0)
Hemoglobin: 12.3 g/dL (ref 12.0–15.0)
MCH: 25.4 pg — ABNORMAL LOW (ref 26.0–34.0)
MCHC: 31.1 g/dL (ref 30.0–36.0)
MCV: 81.6 fL (ref 80.0–100.0)
Platelets: 400 K/uL (ref 150–400)
RBC: 4.85 MIL/uL (ref 3.87–5.11)
RDW: 14.1 % (ref 11.5–15.5)
WBC: 8.3 K/uL (ref 4.0–10.5)
nRBC: 0 % (ref 0.0–0.2)

## 2024-01-25 LAB — RETIC PANEL
Immature Retic Fract: 12.1 % (ref 2.3–15.9)
RBC.: 4.78 MIL/uL (ref 3.87–5.11)
Retic Count, Absolute: 43 K/uL (ref 19.0–186.0)
Retic Ct Pct: 0.9 % (ref 0.4–3.1)
Reticulocyte Hemoglobin: 28.8 pg (ref >27.9)

## 2024-01-25 LAB — IRON AND IRON BINDING CAPACITY (CC-WL,HP ONLY)
Iron: 20 ug/dL — ABNORMAL LOW (ref 28–170)
Saturation Ratios: 5 % — ABNORMAL LOW (ref 10.4–31.8)
TIBC: 424 ug/dL (ref 250–450)
UIBC: 404 ug/dL (ref 148–442)

## 2024-01-25 LAB — FERRITIN: Ferritin: 34 ng/mL (ref 11–307)

## 2024-01-25 NOTE — Progress Notes (Signed)
 Hematology and Oncology Follow Up Visit  Sherry Villegas [id number] August 04, 1985 37 y.o. 01/25/2024   Principle Diagnosis:  Iron  deficiency anemia  Alpha thalassemia minor trait   Current Therapy:  IV iron  as indicated- Venofer  300 mg- last dose 01/13/2023 Folic acid  1 mg PO daily   Interim History:  Sherry Villegas is here today for follow-up.   Today she reports that she has been ok. She is considering going back to GI and her allergist in hopes of getting off of her GERD medications.  She continues to take her daily folic acid .  Her cycle is now regular but is heavy and lasts 7 days. No other blood loss noted.  No abnormal bruising, no petechiae.  She has had some dizziness at times. Thankfully she has not had any falls or syncope.  No fever, chills, n/v, cough, rash, SOB, chest pain, palpitations, abdominal pain or changes in bowel or bladder habits.  Numbness and tingling in her hands and feet comes and goes.  Appetite and hydration are good.    ECOG Performance Status: 1 - Symptomatic but completely ambulatory  Medications:  Allergies as of 01/25/2024       Reactions   Amoxil [amoxicillin] Swelling   Tongue was swollen   Dulera [mometasone  Furo-formoterol  Fum] Hives   Influenza Vaccines Itching        Medication List        Accurate as of January 25, 2024 10:28 AM. If you have any questions, ask your nurse or doctor.          acetaminophen 325 MG tablet Commonly known as: TYLENOL Take 650 mg by mouth every 6 (six) hours as needed.   albuterol  108 (90 Base) MCG/ACT inhaler Commonly known as: VENTOLIN  HFA Inhale 2 puffs into the lungs every 6 (six) hours as needed for wheezing or shortness of breath.   budesonide -formoterol  80-4.5 MCG/ACT inhaler Commonly known as: SYMBICORT  Inhale 2 puffs into the lungs in the morning and at bedtime.   chlorhexidine  4 % external liquid Commonly known as: HIBICLENS  Apply topically daily as needed.   diclofenac  75 MG EC  tablet Commonly known as: VOLTAREN  TAKE 1 TABLET BY MOUTH TWICE A DAY WITH A MEAL   dicyclomine 20 MG tablet Commonly known as: BENTYL Take 20 mg by mouth 3 (three) times daily before meals.   EPINEPHrine  0.3 mg/0.3 mL Soaj injection Commonly known as: EPI-PEN Inject 0.3 mg into the muscle as needed for anaphylaxis.   EXCEDRIN PO Take by mouth. Prn   famotidine  20 MG tablet Commonly known as: Pepcid  Take 1 tablet (20 mg total) by mouth daily.   fexofenadine 60 MG tablet Commonly known as: ALLEGRA Take 60 mg by mouth 2 (two) times daily.   folic acid  1 MG tablet Commonly known as: FOLVITE  Take 1 tablet (1 mg total) by mouth daily.   labetalol  100 MG tablet Commonly known as: NORMODYNE  TAKE 1 TABLET BY MOUTH TWICE A DAY   loratadine 10 MG tablet Commonly known as: CLARITIN Take 10 mg by mouth daily.   nitrofurantoin  (macrocrystal-monohydrate) 100 MG capsule Commonly known as: Macrobid  Take 1 capsule (100 mg total) by mouth 2 (two) times daily.   omeprazole  40 MG capsule Commonly known as: PRILOSEC TAKE 1 CAPSULE BY MOUTH TWICE A DAY   promethazine  25 MG tablet Commonly known as: PHENERGAN  Take 0.5-1 tablets (12.5-25 mg total) by mouth every 6 (six) hours as needed for nausea or vomiting.   tirzepatide  10 MG/0.5ML injection vial Inject 10  mg into the skin once a week.   valACYclovir  1000 MG tablet Commonly known as: VALTREX  Take 1 tablet (1,000 mg total) by mouth 2 (two) times daily.   Vitamin D  (Ergocalciferol ) 1.25 MG (50000 UNIT) Caps capsule Commonly known as: DRISDOL  Take 1 capsule (50,000 Units total) by mouth every 7 (seven) days.        Allergies:  Allergies  Allergen Reactions   Amoxil [Amoxicillin] Swelling    Tongue was swollen   Dulera [Mometasone  Furo-Formoterol  Fum] Hives   Influenza Vaccines Itching    Past Medical History, Surgical history, Social history, and Family History were reviewed and updated.  Review of Systems: All other  10 point review of systems is negative.   Physical Exam:  vitals were not taken for this visit.   Wt Readings from Last 3 Encounters:  01/09/24 223 lb (101.2 kg)  05/25/23 253 lb (114.8 kg)  05/25/23 255 lb (115.7 kg)    Ocular: Sclerae unicteric, pupils equal, round and reactive to light Ear-nose-throat: Oropharynx clear, dentition fair Lymphatic: No cervical or supraclavicular adenopathy Lungs no rales or rhonchi, good excursion bilaterally Heart regular rate and rhythm, no murmur appreciated Abd soft, nontender, positive bowel sounds MSK no focal spinal tenderness, no joint edema Neuro: non-focal, well-oriented, appropriate affect  Lab Results  Component Value Date   WBC 6.2 01/09/2024   HGB 11.8 (L) 01/09/2024   HCT 37.0 01/09/2024   MCV 81.8 01/09/2024   PLT 388.0 01/09/2024   Lab Results  Component Value Date   FERRITIN 46 05/25/2023   IRON  37 05/25/2023   TIBC 395 05/25/2023   UIBC 358 05/25/2023   IRONPCTSAT 9 (L) 05/25/2023   Lab Results  Component Value Date   RETICCTPCT 2.0 05/25/2023   RBC 4.52 01/09/2024   No results found for: KPAFRELGTCHN, LAMBDASER, KAPLAMBRATIO No results found for: IGGSERUM, IGA, IGMSERUM No results found for: STEPHANY CARLOTA BENSON MARKEL EARLA JOANNIE DOC VICK, SPEI   Chemistry      Component Value Date/Time   NA 138 01/09/2024 1813   NA 138 03/10/2022 1640   K 4.0 01/09/2024 1813   CL 103 01/09/2024 1813   CO2 27 01/09/2024 1813   BUN 13 01/09/2024 1813   BUN 11 03/10/2022 1640   CREATININE 0.97 01/09/2024 1813   CREATININE 0.97 03/10/2023 1450      Component Value Date/Time   CALCIUM 9.8 01/09/2024 1813   ALKPHOS 74 01/09/2024 1813   AST 14 01/09/2024 1813   AST 21 09/27/2022 1441   ALT 9 01/09/2024 1813   ALT 11 09/27/2022 1441   BILITOT 0.2 01/09/2024 1813   BILITOT 0.3 09/27/2022 1441     Encounter Diagnoses  Name Primary?   Iron  deficiency anemia due to chronic  blood loss Yes   Menorrhagia with regular cycle    Alpha thalassaemia minor    Impression and Plan: Sherry Villegas is a very pleasant 38 yo [place] American female with iron  deficiency anemia and alpha thalassemia minor trait.   Today Hgb is stable at 12.3 There very well may be a relation of her malabsorption issues with a potential food allergy/GERD.  Iron  studies are pending. Will replace if needed   RTC 3 months APP, labs (CBC,iron , ferritin)  Lauraine CHRISTELLA Dais, PA-C 7/17/202510:28 AM

## 2024-01-26 ENCOUNTER — Telehealth: Payer: Self-pay | Admitting: Hematology & Oncology

## 2024-01-26 NOTE — Telephone Encounter (Signed)
 Attempted to schedule patient for thier follow up and they advised they would call back to schedule. They did not wish to schedule at this time.

## 2024-01-29 ENCOUNTER — Telehealth: Payer: Self-pay | Admitting: Medical Oncology

## 2024-01-29 NOTE — Telephone Encounter (Signed)
 Called to schedule IV Iron  per inbasket. LVM to return call for scheduling,

## 2024-01-31 ENCOUNTER — Telehealth: Payer: Self-pay | Admitting: Medical Oncology

## 2024-01-31 NOTE — Telephone Encounter (Signed)
 Lvm to return call for scheduling 3 doses of IV Iron  per inbasket.

## 2024-02-01 ENCOUNTER — Telehealth: Payer: Self-pay

## 2024-02-01 DIAGNOSIS — R102 Pelvic and perineal pain: Secondary | ICD-10-CM | POA: Diagnosis not present

## 2024-02-01 NOTE — Telephone Encounter (Signed)
 Copied from CRM [id number]. Topic: Clinical - Medication Question >> Feb 01, 2024  4:04 PM Aisha D wrote: Reason for CRM: Pt stated that she has endometriosis and she see's a fertility provider. Pt stated that she told by the provider to take 4 tylenol's and 1 ibuprofen for her cycle. Pt stated that the medication isn't working anymore was advised by fertility provider to reach out to PCP and get a stronger medication prescribed. Pt would like a callback with an update on this request.

## 2024-02-05 ENCOUNTER — Other Ambulatory Visit: Payer: Self-pay | Admitting: Family Medicine

## 2024-02-05 ENCOUNTER — Other Ambulatory Visit: Payer: Self-pay | Admitting: Obstetrics and Gynecology

## 2024-02-05 DIAGNOSIS — N809 Endometriosis, unspecified: Secondary | ICD-10-CM

## 2024-02-05 MED ORDER — TRAMADOL HCL 50 MG PO TABS: 50.0000 mg | ORAL_TABLET | Freq: Three times a day (TID) | ORAL | 0 refills | Status: AC | PRN

## 2024-02-05 NOTE — Telephone Encounter (Signed)
 Pt notified via mychart

## 2024-02-05 NOTE — Telephone Encounter (Signed)
 Pt called and stated she is not trying to get pregnant anymore and she has scheduled surgery for 06/26/24 because of pain .SABRA Would [facility] like something for pain

## 2024-02-12 ENCOUNTER — Telehealth: Payer: Self-pay | Admitting: Medical Oncology

## 2024-02-12 ENCOUNTER — Encounter: Payer: Self-pay | Admitting: Family

## 2024-02-12 NOTE — Telephone Encounter (Addendum)
 Pt started new job and needs to figure out when she can start IV Iron . Pt will call back when able to schedule. Deferring 2 weeks  ----- Message from Lauraine CHRISTELLA Dais sent at 01/25/2024  4:06 PM EDT ----- Iv iron  would be beneficial.  Scheduling please schedule her for IV Venofer  300 mg weekly x 3 ----- Message ----- From: Interface, [facility] [place] Sent: 01/25/2024  10:32 AM EDT To: Lauraine CHRISTELLA Dais, PA-C

## 2024-02-12 NOTE — Telephone Encounter (Signed)
 Called to schedule infusions per inbasket. LVM to return call for scheduling.

## 2024-03-17 ENCOUNTER — Other Ambulatory Visit: Payer: Self-pay | Admitting: Family Medicine

## 2024-03-25 ENCOUNTER — Other Ambulatory Visit: Payer: Self-pay | Admitting: Family Medicine

## 2024-03-25 DIAGNOSIS — K219 Gastro-esophageal reflux disease without esophagitis: Secondary | ICD-10-CM

## 2024-03-25 DIAGNOSIS — N898 Other specified noninflammatory disorders of vagina: Secondary | ICD-10-CM

## 2024-04-04 ENCOUNTER — Telehealth: Payer: Self-pay | Admitting: Medical Oncology

## 2024-04-04 NOTE — Telephone Encounter (Signed)
 Pt called to schedule Iron  infusions. Patient was advised due to the length of time since her last appointment that she needed to have an office visit with labs and pt declined in scheduling at this time.

## 2024-04-11 ENCOUNTER — Other Ambulatory Visit: Payer: Self-pay | Admitting: Family Medicine

## 2024-04-12 ENCOUNTER — Other Ambulatory Visit: Payer: Self-pay | Admitting: Family Medicine

## 2024-04-12 DIAGNOSIS — N809 Endometriosis, unspecified: Secondary | ICD-10-CM

## 2024-04-15 ENCOUNTER — Ambulatory Visit: Payer: Self-pay

## 2024-04-15 NOTE — Telephone Encounter (Signed)
 Tramadol  not on file.

## 2024-04-15 NOTE — Telephone Encounter (Signed)
 Copied from CRM [id number]. Topic: Clinical - Prescription Issue >> Apr 15, 2024  5:28 PM Rea C wrote: Reason for CRM: traMADol  (ULTRAM ) 50 MG tablet   Patient stated that she called the pharmacy a few days ago for refill request. [facility] and [facility]. Refusal reason is showing refill not appropriate.   [id number] [place], [place] - [address] AT [address] [place] [address] Phone: [phone] Fax: [phone] Hours: Not open 24 hours

## 2024-04-16 ENCOUNTER — Other Ambulatory Visit: Payer: Self-pay | Admitting: Family Medicine

## 2024-04-16 DIAGNOSIS — N809 Endometriosis, unspecified: Secondary | ICD-10-CM

## 2024-04-16 MED ORDER — TRAMADOL HCL 50 MG PO TABS: ORAL_TABLET | ORAL | 0 refills | Status: AC

## 2024-04-16 NOTE — Telephone Encounter (Signed)
 Start Date: 02/05/24 End Date: 02/10/24  Written Date: 02/05/24 Expiration Date: 08/03/24     Associated Diagnoses: Endometriosis [N80.9]  Providers  Authorizing Provider: Antonio Cyndee Jamee JONELLE, DO [address], [place] [address]

## 2024-04-16 NOTE — Telephone Encounter (Signed)
 Last filled in 01/2024 per mychart message and also last visit was in 01/2024 as well

## 2024-04-30 ENCOUNTER — Other Ambulatory Visit: Payer: Self-pay | Admitting: Family Medicine

## 2024-04-30 DIAGNOSIS — I10 Essential (primary) hypertension: Secondary | ICD-10-CM

## 2024-05-01 MED ORDER — DICLOFENAC SODIUM 75 MG PO TBEC: 75.0000 mg | DELAYED_RELEASE_TABLET | Freq: Two times a day (BID) | ORAL | 2 refills | Status: AC | PRN

## 2024-05-01 NOTE — Addendum Note (Signed)
 Addended by: BARBRA LANG PARAS on: 05/01/2024 12:57 PM   Modules accepted: Orders

## 2024-05-06 ENCOUNTER — Other Ambulatory Visit: Payer: Self-pay | Admitting: Family Medicine

## 2024-05-06 ENCOUNTER — Telehealth: Payer: Self-pay

## 2024-05-06 NOTE — Telephone Encounter (Signed)
 Copied from CRM #[id number]. Topic: Clinical - Medication Prior Auth >> May 06, 2024 11:56 AM Sherry Villegas wrote: Reason for CRM: Patient stated a pre cert is needed for prescription omeprazole  (PRILOSEC) 40 MG capsule. Stated being denied due to quantity did send on covermymeds.

## 2024-05-07 ENCOUNTER — Other Ambulatory Visit (HOSPITAL_COMMUNITY): Payer: Self-pay

## 2024-05-07 ENCOUNTER — Encounter: Payer: Self-pay | Admitting: Family

## 2024-05-07 ENCOUNTER — Telehealth: Payer: Self-pay

## 2024-05-07 NOTE — Telephone Encounter (Signed)
 Pharmacy Patient Advocate Encounter   Received notification from Pt Calls Messages that prior authorization for Omeprazole  40mg  caps is required/requested.   Insurance verification completed.   The patient is insured through [employer].   Per test claim: PA required; PA submitted to above mentioned insurance via Latent Key/confirmation #/EOC [id number] Status is pending

## 2024-05-08 ENCOUNTER — Other Ambulatory Visit (HOSPITAL_COMMUNITY): Payer: Self-pay

## 2024-05-08 NOTE — Telephone Encounter (Signed)
 Pharmacy Patient Advocate Encounter  Received notification from [employer] that Prior Authorization for Omeprazole  40mg  caps has been APPROVED from 05/07/24 to 05/07/25. Ran test claim, Copay is $21.46. This test claim was processed through [facility]- copay amounts may vary at other pharmacies due to pharmacy/plan contracts, or as the patient moves through the different stages of their insurance plan.   PA #/Case ID/Reference #: [id number]  [facility] to notify of the approval.

## 2024-05-17 ENCOUNTER — Inpatient Hospital Stay: Attending: Hematology & Oncology

## 2024-05-17 ENCOUNTER — Inpatient Hospital Stay: Admitting: Family

## 2024-05-17 VITALS — BP 137/88 | HR 89 | Resp 16 | Wt 212.4 lb

## 2024-05-17 DIAGNOSIS — N920 Excessive and frequent menstruation with regular cycle: Secondary | ICD-10-CM

## 2024-05-17 DIAGNOSIS — D563 Thalassemia minor: Secondary | ICD-10-CM | POA: Insufficient documentation

## 2024-05-17 DIAGNOSIS — D500 Iron deficiency anemia secondary to blood loss (chronic): Secondary | ICD-10-CM

## 2024-05-17 DIAGNOSIS — D509 Iron deficiency anemia, unspecified: Secondary | ICD-10-CM | POA: Diagnosis present

## 2024-05-17 LAB — CBC
HCT: 34.7 % — ABNORMAL LOW (ref 36.0–46.0)
Hemoglobin: 10.5 g/dL — ABNORMAL LOW (ref 12.0–15.0)
MCH: 23.5 pg — ABNORMAL LOW (ref 26.0–34.0)
MCHC: 30.3 g/dL (ref 30.0–36.0)
MCV: 77.6 fL — ABNORMAL LOW (ref 80.0–100.0)
Platelets: 371 K/uL (ref 150–400)
RBC: 4.47 MIL/uL (ref 3.87–5.11)
RDW: 14.5 % (ref 11.5–15.5)
WBC: 7.7 K/uL (ref 4.0–10.5)
nRBC: 0 % (ref 0.0–0.2)

## 2024-05-17 LAB — FERRITIN: Ferritin: 10 ng/mL — ABNORMAL LOW (ref 11–307)

## 2024-05-17 LAB — RETIC PANEL
Immature Retic Fract: 21.7 % — ABNORMAL HIGH (ref 2.3–15.9)
RBC.: 4.34 MIL/uL (ref 3.87–5.11)
Retic Count, Absolute: 63.4 K/uL (ref 19.0–186.0)
Retic Ct Pct: 1.5 % (ref 0.4–3.1)
Reticulocyte Hemoglobin: 21.5 pg — ABNORMAL LOW (ref >27.9)

## 2024-05-17 LAB — IRON AND IRON BINDING CAPACITY (CC-WL,HP ONLY)
Iron: 18 ug/dL — ABNORMAL LOW (ref 28–170)
Saturation Ratios: 4 % — ABNORMAL LOW (ref 10.4–31.8)
TIBC: 484 ug/dL — ABNORMAL HIGH (ref 250–450)
UIBC: 466 ug/dL

## 2024-05-17 NOTE — Progress Notes (Signed)
 Hematology and Oncology Follow Up Visit  Ilaisaane Marts [id number] 01-22-1986 38 y.o. 05/17/2024   Principle Diagnosis:  Iron  deficiency anemia  Alpha thalassemia minor trait   Current Therapy:  IV iron  as indicated Folic acid  1 mg PO daily   Interim History:  Ms. Zetina is here today for follow-up. She is feeling fatigued, SOB, palpitations, dizziness, numbness and tingling in her hands and feet and lower extremities feeling heavy like she is retaining fluid.  No swelling or pitting edema in her extremities at this time. Pedal pulses are 2+.  No falls or syncope reported.  Her cycle is irregular occurring twice a month and flow is heavy. No other blood loss noted. No bruising or petechiae.  No fever, n/v, cough, rash, chest pain, abdominal pain or changes in bowel or bladder habits.  Appetite and hydration are good. Weight is stable at 212 lbs.   ECOG Performance Status: 1 - Symptomatic but completely ambulatory  Medications:  Allergies as of 05/17/2024       Reactions   Amoxil [amoxicillin] Swelling   Tongue was swollen   Dulera [mometasone  Furo-formoterol  Fum] Hives   Influenza Vaccines Itching        Medication List        Accurate as of May 17, 2024  2:57 PM. If you have any questions, ask your nurse or doctor.          acetaminophen 325 MG tablet Commonly known as: TYLENOL Take 650 mg by mouth every 6 (six) hours as needed.   albuterol  108 (90 Base) MCG/ACT inhaler Commonly known as: VENTOLIN  HFA Inhale 2 puffs into the lungs every 6 (six) hours as needed for wheezing or shortness of breath.   budesonide -formoterol  80-4.5 MCG/ACT inhaler Commonly known as: SYMBICORT  Inhale 2 puffs into the lungs in the morning and at bedtime.   chlorhexidine  4 % external liquid Commonly known as: HIBICLENS  Apply topically daily as needed.   diclofenac  75 MG EC tablet Commonly known as: VOLTAREN  Take 1 tablet (75 mg total) by mouth 2 (two) times daily as needed  for mild pain (pain score 1-3) or moderate pain (pain score 4-6).   dicyclomine 20 MG tablet Commonly known as: BENTYL Take 20 mg by mouth 3 (three) times daily before meals.   EPINEPHrine  0.3 mg/0.3 mL Soaj injection Commonly known as: EPI-PEN Inject 0.3 mg into the muscle as needed for anaphylaxis.   EXCEDRIN PO Take by mouth. Prn   famotidine  20 MG tablet Commonly known as: PEPCID  Take 1 tablet (20 mg total) by mouth daily.   fexofenadine 60 MG tablet Commonly known as: ALLEGRA Take 60 mg by mouth 2 (two) times daily.   folic acid  1 MG tablet Commonly known as: FOLVITE  Take 1 tablet (1 mg total) by mouth daily.   labetalol  100 MG tablet Commonly known as: NORMODYNE  TAKE 1 TABLET BY MOUTH TWICE A DAY   loratadine 10 MG tablet Commonly known as: CLARITIN Take 10 mg by mouth daily.   nitrofurantoin  (macrocrystal-monohydrate) 100 MG capsule Commonly known as: Macrobid  Take 1 capsule (100 mg total) by mouth 2 (two) times daily.   omeprazole  40 MG capsule Commonly known as: PRILOSEC TAKE 1 CAPSULE BY MOUTH TWICE A DAY   promethazine  25 MG tablet Commonly known as: PHENERGAN  Take 0.5-1 tablets (12.5-25 mg total) by mouth every 6 (six) hours as needed for nausea or vomiting.   tirzepatide  10 MG/0.5ML injection vial Inject 10 mg into the skin once a week.   traMADol   50 MG tablet Commonly known as: ULTRAM  1 po q8h p4n   valACYclovir  1000 MG tablet Commonly known as: VALTREX  TAKE 1 TABLET BY MOUTH TWICE A DAY   Vitamin D  (Ergocalciferol ) 1.25 MG (50000 UNIT) Caps capsule Commonly known as: DRISDOL  TAKE 1 CAPSULE (50,000 UNITS TOTAL) BY MOUTH EVERY 7 (SEVEN) DAYS        Allergies:  Allergies  Allergen Reactions   Amoxil [Amoxicillin] Swelling    Tongue was swollen   Dulera [Mometasone  Furo-Formoterol  Fum] Hives   Influenza Vaccines Itching    Past Medical History, Surgical history, Social history, and Family History were reviewed and updated.  Review  of Systems: All other 10 point review of systems is negative.   Physical Exam:  vitals were not taken for this visit.   Wt Readings from Last 3 Encounters:  01/25/24 218 lb (98.9 kg)  01/09/24 223 lb (101.2 kg)  05/25/23 253 lb (114.8 kg)    Ocular: Sclerae unicteric, pupils equal, round and reactive to light Ear-nose-throat: Oropharynx clear, dentition fair Lymphatic: No cervical or supraclavicular adenopathy Lungs no rales or rhonchi, good excursion bilaterally Heart regular rate and rhythm, no murmur appreciated Abd soft, nontender, positive bowel sounds MSK no focal spinal tenderness, no joint edema Neuro: non-focal, well-oriented, appropriate affect Breasts: Deferred   Lab Results  Component Value Date   WBC 7.7 05/17/2024   HGB 10.5 (L) 05/17/2024   HCT 34.7 (L) 05/17/2024   MCV 77.6 (L) 05/17/2024   PLT 371 05/17/2024   Lab Results  Component Value Date   FERRITIN 34 01/25/2024   IRON  20 (L) 01/25/2024   TIBC 424 01/25/2024   UIBC 404 01/25/2024   IRONPCTSAT 5 (L) 01/25/2024   Lab Results  Component Value Date   RETICCTPCT 1.5 05/17/2024   RBC 4.34 05/17/2024   No results found for: KPAFRELGTCHN, LAMBDASER, KAPLAMBRATIO No results found for: IGGSERUM, IGA, IGMSERUM No results found for: STEPHANY CARLOTA BENSON MARKEL EARLA JOANNIE DOC VICK, SPEI   Chemistry      Component Value Date/Time   NA 138 01/09/2024 1813   NA 138 03/10/2022 1640   K 4.0 01/09/2024 1813   CL 103 01/09/2024 1813   CO2 27 01/09/2024 1813   BUN 13 01/09/2024 1813   BUN 11 03/10/2022 1640   CREATININE 0.97 01/09/2024 1813   CREATININE 0.97 03/10/2023 1450      Component Value Date/Time   CALCIUM 9.8 01/09/2024 1813   ALKPHOS 74 01/09/2024 1813   AST 14 01/09/2024 1813   AST 21 09/27/2022 1441   ALT 9 01/09/2024 1813   ALT 11 09/27/2022 1441   BILITOT 0.2 01/09/2024 1813   BILITOT 0.3 09/27/2022 1441       Impression and Plan:  Ms. Rothermel is a very pleasant 38 yo African American female with iron  deficiency anemia and alpha thalassemia minor trait.  Hgb is down at 10.5, MCV 77, platelets 371 and WBC count 7.7.  Iron  studies are pending. We will replace if needed.  Continue folic acid  daily.  Follow-up in 3 months.   Lauraine Pepper, NP 11/7/20252:57 PM

## 2024-05-21 ENCOUNTER — Other Ambulatory Visit: Payer: Self-pay | Admitting: Family

## 2024-05-29 ENCOUNTER — Other Ambulatory Visit: Payer: Self-pay | Admitting: Family

## 2024-06-07 MED FILL — Iron Sucrose Inj 20 MG/ML (Fe Equiv): Qty: 265 | Status: AC

## 2024-06-08 ENCOUNTER — Inpatient Hospital Stay

## 2024-06-08 VITALS — BP 120/71 | HR 81 | Temp 98.2°F | Resp 16

## 2024-06-08 DIAGNOSIS — D500 Iron deficiency anemia secondary to blood loss (chronic): Secondary | ICD-10-CM

## 2024-06-08 DIAGNOSIS — D509 Iron deficiency anemia, unspecified: Secondary | ICD-10-CM | POA: Diagnosis not present

## 2024-06-08 DIAGNOSIS — N920 Excessive and frequent menstruation with regular cycle: Secondary | ICD-10-CM

## 2024-06-08 MED ORDER — IRON SUCROSE 300 MG IVPB - SIMPLE MED
300.0000 mg | Freq: Once | Status: AC
  Administered 2024-06-08: 300 mg via INTRAVENOUS
  Filled 2024-06-08: qty 300

## 2024-06-08 MED ORDER — SODIUM CHLORIDE 0.9 % IV SOLN: Freq: Once | INTRAVENOUS | Status: AC

## 2024-06-08 NOTE — Patient Instructions (Signed)

## 2024-06-14 MED FILL — Iron Sucrose Inj 20 MG/ML (Fe Equiv): Qty: 265 | Status: AC

## 2024-06-15 ENCOUNTER — Inpatient Hospital Stay: Attending: Hematology & Oncology

## 2024-06-15 VITALS — BP 112/77 | HR 86 | Temp 97.3°F | Resp 20

## 2024-06-15 DIAGNOSIS — N920 Excessive and frequent menstruation with regular cycle: Secondary | ICD-10-CM

## 2024-06-15 DIAGNOSIS — D509 Iron deficiency anemia, unspecified: Secondary | ICD-10-CM | POA: Insufficient documentation

## 2024-06-15 DIAGNOSIS — D500 Iron deficiency anemia secondary to blood loss (chronic): Secondary | ICD-10-CM

## 2024-06-15 MED ORDER — SODIUM CHLORIDE 0.9 % IV SOLN: Freq: Once | INTRAVENOUS | Status: AC

## 2024-06-15 MED ORDER — IRON SUCROSE 300 MG IVPB - SIMPLE MED
300.0000 mg | Freq: Once | Status: AC
  Administered 2024-06-15: 300 mg via INTRAVENOUS
  Filled 2024-06-15: qty 300

## 2024-06-21 MED FILL — Iron Sucrose Inj 20 MG/ML (Fe Equiv): Qty: 265 | Status: AC

## 2024-06-22 ENCOUNTER — Inpatient Hospital Stay

## 2024-06-22 VITALS — BP 115/80 | HR 82 | Temp 97.2°F | Resp 18

## 2024-06-22 DIAGNOSIS — D509 Iron deficiency anemia, unspecified: Secondary | ICD-10-CM | POA: Diagnosis not present

## 2024-06-22 DIAGNOSIS — N920 Excessive and frequent menstruation with regular cycle: Secondary | ICD-10-CM

## 2024-06-22 DIAGNOSIS — D500 Iron deficiency anemia secondary to blood loss (chronic): Secondary | ICD-10-CM

## 2024-06-22 MED ORDER — IRON SUCROSE 300 MG IVPB - SIMPLE MED
300.0000 mg | Freq: Once | Status: AC
  Administered 2024-06-22: 300 mg via INTRAVENOUS
  Filled 2024-06-22: qty 300

## 2024-06-22 MED ORDER — SODIUM CHLORIDE 0.9 % IV SOLN: Freq: Once | INTRAVENOUS | Status: AC

## 2024-06-22 NOTE — Progress Notes (Signed)
Patient declined to stay for post iron 30 minute observation.

## 2024-07-17 ENCOUNTER — Ambulatory Visit (INDEPENDENT_AMBULATORY_CARE_PROVIDER_SITE_OTHER): Admitting: Student

## 2024-07-17 ENCOUNTER — Encounter: Payer: Self-pay | Admitting: Student

## 2024-07-17 ENCOUNTER — Ambulatory Visit: Payer: Self-pay

## 2024-07-17 VITALS — BP 153/87 | HR 90 | Temp 98.4°F | Ht 64.0 in | Wt 216.2 lb

## 2024-07-17 DIAGNOSIS — J4541 Moderate persistent asthma with (acute) exacerbation: Secondary | ICD-10-CM

## 2024-07-17 DIAGNOSIS — R051 Acute cough: Secondary | ICD-10-CM

## 2024-07-17 MED ORDER — GUAIFENESIN ER 600 MG PO TB12: 600.0000 mg | ORAL_TABLET | Freq: Two times a day (BID) | ORAL | 0 refills | Status: AC

## 2024-07-17 MED ORDER — ALBUTEROL SULFATE HFA 108 (90 BASE) MCG/ACT IN AERS: 2.0000 | INHALATION_SPRAY | Freq: Four times a day (QID) | RESPIRATORY_TRACT | 2 refills | Status: DC | PRN

## 2024-07-17 MED ORDER — BENZONATATE 100 MG PO CAPS: 100.0000 mg | ORAL_CAPSULE | Freq: Three times a day (TID) | ORAL | 0 refills | Status: AC | PRN

## 2024-07-17 MED ORDER — PREDNISONE 20 MG PO TABS: 40.0000 mg | ORAL_TABLET | Freq: Every day | ORAL | 0 refills | Status: AC

## 2024-07-17 MED ORDER — AZITHROMYCIN 250 MG PO TABS: ORAL_TABLET | ORAL | 0 refills | Status: AC

## 2024-07-17 NOTE — Progress Notes (Signed)
 Chief Complaint  Patient presents with   Cough    Sherry Villegas here for URI complaints.  PMHx- Asthma,   Patient reports persistent cough since 07/05/24, initially consistent with cold symptoms. Over the past several days, cough has worsened with associated wheezing and intermittent shortness of breath during coughing episodes. Reports green sputum production. Denies fever, chest pain, hemoptysis, or recent travel. Has history of asthma She has not been using Symbicort  daily/ consistently.  Patient denies fever, chills,  CP, palpitations, dyspnea, edema, HA, vision changes, N/V/D, abdominal pain, urinary symptoms, rash, weight changes   Past Medical History:  Diagnosis Date   Anemia    Anxiety    Asthma    Back pain    Constipation    GERD (gastroesophageal reflux disease)    Hyperlipidemia    Hypertension    Migraine    Stomach ulcer    Vaginal Pap smear, abnormal     Objective BP (!) 153/87   Pulse 90   Temp 98.4 F (36.9 C)   Ht 5' 4 (1.626 m)   Wt 216 lb 3.2 oz (98.1 kg)   SpO2 100%   BMI 37.11 kg/m  General: Awake, alert, appears stated age HEENT: AT, [place], ears patent b/l and TM's neg, nares patent w/o discharge, pharynx pink and without exudates, MMM Neck: No masses or asymmetry Heart: RRR Lungs: CTAB, no accessory muscle use +cough Psych: Age appropriate judgment and insight, normal mood and affect  Moderate persistent asthma with exacerbation  Acute cough  Rx- Zpack, prednisone  Rx-Tessalon  pearls, Mucinex  Continue to push fluids, practice good hand hygiene, cover mouth when coughing. F/u prn. If starting to experience fevers, shaking, or shortness of breath, seek immediate care. Pt voiced understanding and agreement to the plan.  Harlene LITTIE Jolly, DNP, AGNP-C 07/17/2024 2:56 PM

## 2024-07-17 NOTE — Telephone Encounter (Signed)
 FYI Only or Action Required?: FYI only for provider: appointment scheduled on 07/17/24.  Patient was last seen in primary care on 01/09/2024 by Antonio Meth, Jamee SAUNDERS, DO.  Called Nurse Triage reporting Cough.  Symptoms began 12/26.  Interventions attempted: Nothing.  Symptoms are: rapidly worsening.  Triage Disposition: See HCP Within 4 Hours (Or PCP Triage)  Patient/caregiver understands and will follow disposition?: Yes  Copied from CRM #[id number]. Topic: Clinical - Red Word Triage >> Jul 17, 2024  1:32 PM Karryn Kosinski wrote: Red Word that prompted transfer to Nurse Triage: been having this cold since Christmas. And she has been taking over the counter medicine and now the coughing is getting worse. Wheezing and shortness of breath in between coughs. Nausea. Reason for Disposition  Wheezing is present  Answer Assessment - Initial Assessment Questions 1. ONSET: When did the cough begin?      07/05/24  2. SEVERITY: How bad is the cough today?      Pt noted to have severe coughing spell over the phone, audibly wheezing, reports she does not have a nebulizer to use  3. SPUTUM: Describe the color of your sputum (e.g., none, dry cough; clear, white, yellow, green)     Greenish jello  4. HEMOPTYSIS: Are you coughing up any blood? If Yes, ask: How much? (e.g., flecks, streaks, tablespoons, etc.)     Denies  5. DIFFICULTY BREATHING: Are you having difficulty breathing? If Yes, ask: How bad is it? (e.g., mild, moderate, severe)      Denies  6. FEVER: Do you have a fever? If Yes, ask: What is your temperature, how was it measured, and when did it start?     Denies  8. LUNG HISTORY: Do you have any history of lung disease?  (e.g., pulmonary embolus, asthma, emphysema)     Asthma   10. OTHER SYMPTOMS: Do you have any other symptoms? (e.g., runny nose, wheezing, chest pain)       Denies  11. PREGNANCY: Is there any chance you are pregnant? When was your last  menstrual period?       Denies  12. TRAVEL: Have you traveled out of the country in the last month? (e.g., travel history, exposures)       Denies  Protocols used: Cough - Acute Productive-A-AH

## 2024-07-17 NOTE — Patient Instructions (Addendum)
 Allergic Rhinitis  Previous testing 09/29/2021: Positive to grass, dust mites, roach Start Ipatropium nasal spray: 1 spray per nostril as needed for runny nose. Use saline nasal spray as needed.    Urticaria -maybe one occurrence since last office visit Continue Allergra 180mg  twice day Stopped Xolair  injections  Hive labs: Microcytic anemia,, low TSH, elevated ESR and very positive CU index - for SKIN only reaction, okay to take Benadryl 25 mg every 6 hours - for SKIN + ANY additional symptoms, OR IF concern for LIFE THREATENING reaction = Epipen  Autoinjector EpiPen  0.3 mg. - If using Epinephrine  autoinjector, call 911  Asthma: -Stop Symbicort  ( was using flare pattern) -Sample of Airsupra  given to use 2 puffs every 4-6 hours as needed for cough, wheeze, tightness in chest, shortness of breath.  Do not exceed 12 puffs in 24 hours.  Discussed that this inhaler contains a steroid and albuterol .  This will take the place of your albuterol  inhaler you have. - Spacer given along with demonstration.  Make sure to rinse your mouth out afterwards. - If your mouth becomes raw/or feel like you have thrush please schedule an appointment with our office  Continue prednisone , azithromycin , and Tessalon  Perles as per your primary care physician  Monitor frequency of use of Airsupra  Asthma control goals:  Full participation in all desired activities (may need albuterol  before activity) Albuterol  use two times or less a week on average (not counting use with activity) Cough interfering with sleep two times or less a month Oral steroids no more than once a year No hospitalizations   Thrush - Focus on reducing ICS   Heartburn: Continue lifestyle and dietary modifications Continue omeprazole  twice a day as prescribed. Continue Pepcid  20mg  as prescribed.  Follow up: in 6 to 8 weeks or sooner if needed

## 2024-07-17 NOTE — Telephone Encounter (Signed)
 Pt has scheduled OV.

## 2024-07-18 ENCOUNTER — Ambulatory Visit: Payer: Self-pay | Admitting: Family

## 2024-07-18 ENCOUNTER — Other Ambulatory Visit: Payer: Self-pay

## 2024-07-18 ENCOUNTER — Encounter: Payer: Self-pay | Admitting: Family

## 2024-07-18 VITALS — HR 94 | Temp 96.4°F | Resp 18 | Wt 216.6 lb

## 2024-07-18 DIAGNOSIS — J4540 Moderate persistent asthma, uncomplicated: Secondary | ICD-10-CM | POA: Diagnosis not present

## 2024-07-18 DIAGNOSIS — B370 Candidal stomatitis: Secondary | ICD-10-CM | POA: Diagnosis not present

## 2024-07-18 DIAGNOSIS — T782XXD Anaphylactic shock, unspecified, subsequent encounter: Secondary | ICD-10-CM

## 2024-07-18 DIAGNOSIS — L501 Idiopathic urticaria: Secondary | ICD-10-CM

## 2024-07-18 DIAGNOSIS — K219 Gastro-esophageal reflux disease without esophagitis: Secondary | ICD-10-CM | POA: Diagnosis not present

## 2024-07-18 DIAGNOSIS — J3089 Other allergic rhinitis: Secondary | ICD-10-CM | POA: Diagnosis not present

## 2024-07-18 MED ORDER — EPINEPHRINE 0.3 MG/0.3ML IJ SOAJ: 0.3000 mg | INTRAMUSCULAR | 1 refills | Status: AC | PRN

## 2024-07-18 MED ORDER — AIRSUPRA 90-80 MCG/ACT IN AERO: 2.0000 | INHALATION_SPRAY | RESPIRATORY_TRACT | 1 refills | Status: AC | PRN

## 2024-07-18 NOTE — Progress Notes (Signed)
 "  [address] [place] [address] Dept: [phone]  FOLLOW UP NOTE  Patient ID: Sherry Villegas, female    DOB: Aug 12, 1985  Age: 39 y.o. MRN: [id number] Date of Office Visit: 07/18/2024  Assessment  Chief Complaint: Medication Refill (Liquid for thrush) and Follow-up (Seen given zpack, tessolon pearls and prednosone  not using inhalers daily)  HPI Sherry Villegas is a 39 year old female who presents today for follow-up of acute recurrent maxillary sinusitis, idiopathic urticaria, moderate persistent asthma without complication, allergic rhinitis, anaphylaxis, gastroesophageal reflux disease, and oral candidiasis.  She was last seen on Nov 23, 2022 by Dr. Lorin.  She denies any new diagnosis or surgery since her last office visit.  Moderate persistent asthma: She reports that she picked up a cough the day after Christmas.  She thought she was getting better, then yesterday  she was worse.  She went to her primary care physician and was given prednisone  that she started this morning, azithromycin  and Tessalon  Perles that she started last night.  She was told by her primary care physician that they did not hear any wheezing, but to follow-up with us .  She reports that she does not use an asthma inhaler daily.  She was diagnosed with asthma at an older age.  She mentions approximately year and a half ago her body was going through something.  During that time they were changing her blood pressure medications and asthma medications.  She feels like her blood pressure medicines maybe made her asthma worse.  Her asthma got better so she stopped her asthma medicine.  Also, she was receiving iron  infusions and wondered if this helped.  She was also during that time having hives out of nowhere, but has not really had any hives since.  She was also told by one of her physicians that it sounds like she has MCAS, but mentions she has not had any problems since.  Her breathing was much better then.  She  reports that asthma medicines make her mouth raw even if she rinses her mouth out afterwards.  She has not been using a spacer.  In November she had to use her Symbicort  2 puffs once a day for 1 day and it caused her mouth to be raw.  She tried using a little bit of leftover nystatin  and used peroxide and Listerine.  Right now with her cough she has mucus that is greenish, yellowish, and clear in color.  Yesterday she thought she felt a little bit of tightness in her chest, but the medicine she was prescribed by her primary care physician has helped.  She guesses that she feels a little short of breath.  Yesterday she thought she heard wheezing, but felt like it was more of a burning in her chest.  She denies fever or chills.  She feels like she may have been around sick contact because she was around family members over the holidays.  She has just been using her Symbicort  as needed and reports rare use of albuterol . She has maybe used her Symbicort  3 times. Since her last office visit she has not made any trips to the emergency room or urgent care due to breathing problems.  She has just been on this 1 round of steroids, that she is taking now.  Allergic rhinitis: She reports that she always has postnasal drip.  She did not start the ipratropium bromide  nasal spray because previous nose sprays, that were possible steroid nasal sprays, caused headaches and  nosebleeds.  She also reports rhinorrhea that is clear, but  2 days ago she saw some blood.  She also has nasal congestion at times.   Urticaria: She reports that she maybe had hives once where she took Allegra.  She feels like it was something that she ate that caused it, but she cannot remember what she ate.  This was a couple months ago.  She would like a refill of her epinephrine  autoinjector device.  Heartburn: She reports that she is now taking omeprazole  twice a day and it works really good.  She likes it better than Protonix .  She also has Pepcid   that she takes as needed.  She has also noticed an itch on her thumb for the past 1 to 2 weeks.     Drug Allergies:  Allergies[1]  Review of Systems: Negative except as per HPI   Physical Exam: Pulse 94   Temp (!) 96.4 F (35.8 C) (Temporal)   Resp 18   Wt 216 lb 9.6 oz (98.2 kg)   SpO2 99%   BMI 37.18 kg/m    Physical Exam Constitutional:      Appearance: Normal appearance.  HENT:     Head: Normocephalic and atraumatic.     Comments: Pharynx normal, eyes normal, ears normal, nose: Bilateral lower turbinates mildly edematous with no drainage noted    Right Ear: Tympanic membrane, ear canal and external ear normal.     Left Ear: Tympanic membrane, ear canal and external ear normal.     Nose: Nose normal.     Mouth/Throat:     Mouth: Mucous membranes are moist.     Pharynx: Oropharynx is clear.  Eyes:     Conjunctiva/sclera: Conjunctivae normal.  Cardiovascular:     Rate and Rhythm: Regular rhythm.  Pulmonary:     Effort: Pulmonary effort is normal.     Breath sounds: Normal breath sounds.     Comments: Lungs clear to auscultation Musculoskeletal:     Cervical back: Neck supple.  Skin:    General: Skin is warm.  Neurological:     Mental Status: She is alert and oriented to person, place, and time.  Psychiatric:        Mood and Affect: Mood normal.        Behavior: Behavior normal.        Thought Content: Thought content normal.        Judgment: Judgment normal.     Diagnostics: FVC 2.96 L (92%), FEV1 2.39 L (89%), FEV1/FVC 0.81.  Spirometry indicates normal spirometry  Assessment and Plan: 1. Moderate persistent asthma without complication   2. Other allergic rhinitis   3. Anaphylaxis, subsequent encounter   4. Idiopathic urticaria   5. Gastroesophageal reflux disease without esophagitis   6. Oral candidiasis     Meds ordered this encounter  Medications   EPINEPHrine  0.3 mg/0.3 mL IJ SOAJ injection    Sig: Inject 0.3 mg into the muscle as needed  for anaphylaxis.    Dispense:  1 each    Refill:  1   Albuterol -Budesonide  (AIRSUPRA ) 90-80 MCG/ACT AERO    Sig: Inhale 2 puffs into the lungs as needed (Use as needed for cough, wheeze, tightness in chest, or shortness of breath.  Do not exceed 12 puffs in 24 hours).    Dispense:  10.7 g    Refill:  1    Patient has coupon with her    Patient Instructions  Allergic Rhinitis  Previous testing 09/29/2021: Positive  to grass, dust mites, roach Start Ipatropium nasal spray: 1 spray per nostril as needed for runny nose. Use saline nasal spray as needed.    Urticaria -maybe one occurrence since last office visit Continue Allergra 180mg  twice day Stopped Xolair  injections  Hive labs: Microcytic anemia,, low TSH, elevated ESR and very positive CU index - for SKIN only reaction, okay to take Benadryl 25 mg every 6 hours - for SKIN + ANY additional symptoms, OR IF concern for LIFE THREATENING reaction = Epipen  Autoinjector EpiPen  0.3 mg. - If using Epinephrine  autoinjector, call 911  Asthma: -Stop Symbicort  ( was using flare pattern) -Sample of Airsupra  given to use 2 puffs every 4-6 hours as needed for cough, wheeze, tightness in chest, shortness of breath.  Do not exceed 12 puffs in 24 hours.  Discussed that this inhaler contains a steroid and albuterol .  This will take the place of your albuterol  inhaler you have. - Spacer given along with demonstration.  Make sure to rinse your mouth out afterwards. - If your mouth becomes raw/or feel like you have thrush please schedule an appointment with our office  Continue prednisone , azithromycin , and Tessalon  Perles as per your primary care physician  Monitor frequency of use of Airsupra  Asthma control goals:  Full participation in all desired activities (may need albuterol  before activity) Albuterol  use two times or less a week on average (not counting use with activity) Cough interfering with sleep two times or less a month Oral steroids no  more than once a year No hospitalizations   Thrush - Focus on reducing ICS   Heartburn: Continue lifestyle and dietary modifications Continue omeprazole  twice a day as prescribed. Continue Pepcid  20mg  as prescribed.  Follow up: in 6 to 8 weeks or sooner if needed    Return in about 6 weeks (around 08/29/2024).    Thank you for the opportunity to care for this patient.  Please do not hesitate to contact me with questions.  Wanda Craze, FNP [facility]         [1]  Allergies Allergen Reactions   Amoxil [Amoxicillin] Swelling    Tongue was swollen   Dulera [Mometasone  Furo-Formoterol  Fum] Hives   Influenza Vaccines Itching   "

## 2024-07-24 ENCOUNTER — Telehealth: Payer: Self-pay

## 2024-07-24 ENCOUNTER — Other Ambulatory Visit (HOSPITAL_COMMUNITY): Payer: Self-pay

## 2024-07-24 NOTE — Telephone Encounter (Signed)
*  [facility]  Pharmacy Patient Advocate Encounter   Received notification from Fax that prior authorization for EPINEPHrine  0.3MG /0.3ML auto-injectors   is required/requested.   Insurance verification completed.   The patient is insured through [employer].   Per test claim: The current 30 day co-pay is, $278.18.  No PA needed at this time. This test claim was processed through [facility]- copay amounts may vary at other pharmacies due to pharmacy/plan contracts, or as the patient moves through the different stages of their insurance plan.     *patient has deductible to meet causing higher copay  Key: [id number]

## 2024-08-02 ENCOUNTER — Other Ambulatory Visit: Payer: Self-pay

## 2024-08-02 DIAGNOSIS — D563 Thalassemia minor: Secondary | ICD-10-CM

## 2024-08-02 MED ORDER — FOLIC ACID 1 MG PO TABS: 1.0000 mg | ORAL_TABLET | Freq: Every day | ORAL | 3 refills | Status: AC

## 2024-08-16 ENCOUNTER — Encounter: Payer: Self-pay | Admitting: Family

## 2024-08-16 ENCOUNTER — Other Ambulatory Visit: Payer: Self-pay

## 2024-08-16 ENCOUNTER — Inpatient Hospital Stay: Admitting: Family

## 2024-08-16 ENCOUNTER — Inpatient Hospital Stay: Attending: Hematology & Oncology

## 2024-11-06 ENCOUNTER — Ambulatory Visit (HOSPITAL_COMMUNITY): Admit: 2024-11-06 | Admitting: Obstetrics and Gynecology
